# Patient Record
Sex: Female | Born: 1970 | Race: Black or African American | Hispanic: No | Marital: Married | Smoking: Never smoker
Health system: Southern US, Community
[De-identification: ages and names within clinical notes are randomized; demographics above are authoritative.]

## PROBLEM LIST (undated history)

## (undated) DIAGNOSIS — IMO0002 Reserved for concepts with insufficient information to code with codable children: Secondary | ICD-10-CM

## (undated) DIAGNOSIS — N898 Other specified noninflammatory disorders of vagina: Secondary | ICD-10-CM

## (undated) DIAGNOSIS — F419 Anxiety disorder, unspecified: Secondary | ICD-10-CM

## (undated) DIAGNOSIS — J45909 Unspecified asthma, uncomplicated: Secondary | ICD-10-CM

## (undated) DIAGNOSIS — I10 Essential (primary) hypertension: Secondary | ICD-10-CM

## (undated) DIAGNOSIS — R87619 Unspecified abnormal cytological findings in specimens from cervix uteri: Secondary | ICD-10-CM

## (undated) DIAGNOSIS — D219 Benign neoplasm of connective and other soft tissue, unspecified: Secondary | ICD-10-CM

## (undated) DIAGNOSIS — F32A Depression, unspecified: Secondary | ICD-10-CM

## (undated) DIAGNOSIS — R002 Palpitations: Secondary | ICD-10-CM

## (undated) DIAGNOSIS — F329 Major depressive disorder, single episode, unspecified: Secondary | ICD-10-CM

## (undated) DIAGNOSIS — K219 Gastro-esophageal reflux disease without esophagitis: Secondary | ICD-10-CM

## (undated) DIAGNOSIS — A64 Unspecified sexually transmitted disease: Secondary | ICD-10-CM

## (undated) DIAGNOSIS — E559 Vitamin D deficiency, unspecified: Secondary | ICD-10-CM

## (undated) HISTORY — PX: DILATION AND CURETTAGE OF UTERUS: SHX78

## (undated) HISTORY — DX: Major depressive disorder, single episode, unspecified: F32.9

## (undated) HISTORY — DX: Reserved for concepts with insufficient information to code with codable children: IMO0002

## (undated) HISTORY — DX: Other specified noninflammatory disorders of vagina: N89.8

## (undated) HISTORY — DX: Depression, unspecified: F32.A

## (undated) HISTORY — DX: Unspecified asthma, uncomplicated: J45.909

## (undated) HISTORY — DX: Anxiety disorder, unspecified: F41.9

## (undated) HISTORY — DX: Essential (primary) hypertension: I10

## (undated) HISTORY — PX: CRYOTHERAPY: SHX1416

## (undated) HISTORY — DX: Palpitations: R00.2

## (undated) HISTORY — DX: Unspecified sexually transmitted disease: A64

## (undated) HISTORY — DX: Gastro-esophageal reflux disease without esophagitis: K21.9

## (undated) HISTORY — DX: Vitamin D deficiency, unspecified: E55.9

## (undated) HISTORY — DX: Benign neoplasm of connective and other soft tissue, unspecified: D21.9

## (undated) HISTORY — DX: Unspecified abnormal cytological findings in specimens from cervix uteri: R87.619

## (undated) HISTORY — PX: COLPOSCOPY: SHX161

---

## 2013-03-18 ENCOUNTER — Other Ambulatory Visit: Payer: Self-pay | Admitting: Certified Nurse Midwife

## 2013-03-20 NOTE — Telephone Encounter (Signed)
Patient was given 1 year supply at [facility] in 11-13 is scheduled for 11-14

## 2013-04-28 ENCOUNTER — Other Ambulatory Visit: Payer: Self-pay | Admitting: Certified Nurse Midwife

## 2013-04-28 NOTE — Telephone Encounter (Signed)
[facility] request for refill on NUVARING Last filled - 09/22/12 X 1 YEAR Last AEX - 10/31/3 Next AEX - 09/26/13 RX denied.  Requested too soon.

## 2013-09-26 ENCOUNTER — Encounter: Payer: Self-pay | Admitting: Certified Nurse Midwife

## 2013-09-26 ENCOUNTER — Ambulatory Visit (INDEPENDENT_AMBULATORY_CARE_PROVIDER_SITE_OTHER): Payer: 59 | Admitting: Certified Nurse Midwife

## 2013-09-26 VITALS — BP 110/64 | Ht 61.75 in | Wt 149.0 lb

## 2013-09-26 DIAGNOSIS — Z01419 Encounter for gynecological examination (general) (routine) without abnormal findings: Secondary | ICD-10-CM

## 2013-09-26 DIAGNOSIS — Z0000 Encounter for general adult medical examination without abnormal findings: Secondary | ICD-10-CM

## 2013-09-26 DIAGNOSIS — Z309 Encounter for contraceptive management, unspecified: Secondary | ICD-10-CM

## 2013-09-26 DIAGNOSIS — Z23 Encounter for immunization: Secondary | ICD-10-CM

## 2013-09-26 DIAGNOSIS — A6000 Herpesviral infection of urogenital system, unspecified: Secondary | ICD-10-CM

## 2013-09-26 MED ORDER — ETONOGESTREL-ETHINYL ESTRADIOL 0.12-0.015 MG/24HR VA RING: VAGINAL_RING | VAGINAL | Status: DC

## 2013-09-26 MED ORDER — VALACYCLOVIR HCL 1 G PO TABS: 1000.0000 mg | ORAL_TABLET | Freq: Every day | ORAL | Status: DC

## 2013-09-26 MED ORDER — ETONOGESTREL-ETHINYL ESTRADIOL 0.12-0.015 MG/24HR VA RING
VAGINAL_RING | VAGINAL | Status: DC
Start: 2013-09-26 — End: 2014-10-26

## 2013-09-26 NOTE — Progress Notes (Signed)
42 y.o. [id number] Single African American Fe here for annual exam.  Periods normal, no issues. Nuvaring working well for contraception, desires continuance. Desires GC, Chlamydia screen only today. Sees PCP prn. Had lab work at health fair at work, all normal per patient. No health issues today.  Patient's last menstrual period was 09/17/2013.          Sexually active: yes  The current method of family planning is NuvaRing vaginal inserts.    Exercising: yes  cardio Smoker:  no  Health Maintenance: Pap:  09-22-12 neg HPV neg MMG:  none Colonoscopy:  none BMD:   none TDaP:  2002 Labs: Poct urine-neg Self breast exam: done occ   reports that she has never smoked. She does not have any smokeless tobacco history on file. She reports that she does not drink alcohol or use illicit drugs.  Past Medical History  Diagnosis Date  . Asthma   . STD (sexually transmitted disease)     HSV2  . Depression   . Abnormal Pap smear     many yrs ago    Past Surgical History  Procedure Laterality Date  . Colposcopy    . Cryotherapy      for abnormal pap  . Dilation and curettage of uterus      Current Outpatient Prescriptions  Medication Sig Dispense Refill  . ibuprofen (ADVIL,MOTRIN) 200 MG tablet Take 200 mg by mouth every 6 (six) hours as needed.      . Multiple Vitamins-Minerals (MULTIVITAMIN PO) Take by mouth daily.      Marland Kitchen NUVARING 0.12-0.015 MG/24HR vaginal ring Insert vaginally for 21 days then remove for 7 days      . phentermine 37.5 MG capsule Take 37.5 mg by mouth every morning.      . valACYclovir (VALTREX) 1000 MG tablet daily. 1/2 daily       No current facility-administered medications for this visit.    Family History  Problem Relation Age of Onset  . Diabetes Mother   . Cancer Mother     leukemia  . Lupus Mother   . Hypertension Mother   . Hypertension Father   . Breast cancer Maternal Aunt     ROS:  Pertinent items are noted in HPI.  Otherwise, a comprehensive ROS  was negative.  Exam:   BP 110/64  Ht 5' 1.75" (1.568 m)  Wt 149 lb (67.586 kg)  BMI 27.49 kg/m2  LMP 09/17/2013 Height: 5' 1.75" (156.8 cm)  Ht Readings from Last 3 Encounters:  09/26/13 5' 1.75" (1.568 m)    General appearance: alert, cooperative and appears stated age Head: Normocephalic, without obvious abnormality, atraumatic Neck: no adenopathy, supple, symmetrical, trachea midline and thyroid normal to inspection and palpation Lungs: clear to auscultation bilaterally Breasts: normal appearance, no masses or tenderness, No nipple retraction or dimpling, No nipple discharge or bleeding, No axillary or supraclavicular adenopathy Heart: regular rate and rhythm Abdomen: soft, non-tender; no masses,  no organomegaly Extremities: extremities normal, atraumatic, no cyanosis or edema Skin: Skin color, texture, turgor normal. No rashes or lesions Lymph nodes: Cervical, supraclavicular, and axillary nodes normal. No abnormal inguinal nodes palpated Neurologic: Grossly normal   Pelvic: External genitalia:  no lesions              Urethra:  normal appearing urethra with no masses, tenderness or lesions              Bartholin's and Skene's: normal  Vagina: normal appearing vagina with normal color and discharge, no lesions              Cervix: normal, non tender              Pap taken: no Bimanual Exam:  Uterus:  normal size, contour, position, consistency, mobility, non-tender and anteverted              Adnexa: normal adnexa and no mass, fullness, tenderness               Rectovaginal: Confirms               Anus:  normal sphincter tone, no lesions  A:  Well Woman with normal exam  Contraception Nuvaring desired  STD screening  Immunization update  P:   Reviewed health and wellness pertinent to exam  Rx Nuvaring see order  Lab GC,Chlamydia  Requests  TDAP  Pap smear as per guidelines   Mammogram given information to schedule her first one pap smear not taken  today  counseled on breast self exam, mammography screening, STD prevention, adequate intake of calcium and vitamin D, diet and exercise  return annually or prn  An After Visit Summary was printed and given to the patient.

## 2013-09-27 NOTE — Progress Notes (Signed)
Note reviewed, agree with plan.  Coraline Talwar, MD  

## 2013-09-28 LAB — IPS N GONORRHOEA AND CHLAMYDIA BY PCR

## 2014-01-08 ENCOUNTER — Ambulatory Visit (INDEPENDENT_AMBULATORY_CARE_PROVIDER_SITE_OTHER): Payer: 59 | Admitting: Internal Medicine

## 2014-01-08 VITALS — BP 110/72 | HR 87 | Temp 98.9°F | Resp 18 | Ht 62.0 in | Wt 156.0 lb

## 2014-01-08 DIAGNOSIS — K529 Noninfective gastroenteritis and colitis, unspecified: Secondary | ICD-10-CM

## 2014-01-08 DIAGNOSIS — K5289 Other specified noninfective gastroenteritis and colitis: Secondary | ICD-10-CM

## 2014-01-08 MED ORDER — ONDANSETRON HCL 8 MG PO TABS: 8.0000 mg | ORAL_TABLET | Freq: Three times a day (TID) | ORAL | Status: DC | PRN

## 2014-01-08 NOTE — Progress Notes (Signed)
   Subjective:    Patient ID: Beth Frost, female    DOB: 08-Jan-1971, 43 y.o.   MRN: [id number]  HPI 43 year old female presents with nausea, emesis, chills, and diarrhea. She started feeling sick on Saturday. She also complains of stomach cramps. The diarrhea is her biggest concern. She had shrimp on Friday night so she's not sure if she has a stomach virus or food poisoning. She has taken pepto bismol and says that has not helped. She is not able to keep fluids in her. Since Saturday the only solid food she has eaten is half a PB sandwich and a little bite of green beans; otherwise it has all been liquid intake. She also complains of a slight headache; no sensitivity to light. 9 sttols per day, no vomiting, no blood or mucus   Review of Systems     Objective:   Physical Exam  Vitals reviewed. Constitutional: She is oriented to person, place, and time. She appears well-developed and well-nourished.  Eyes: EOM are normal. No scleral icterus.  Neck: Normal range of motion.  Cardiovascular: Normal rate, regular rhythm and normal heart sounds.   Pulmonary/Chest: Effort normal and breath sounds normal.  Abdominal: Soft. Bowel sounds are increased. There is no hepatosplenomegaly. There is no tenderness. There is no rebound and no CVA tenderness.  Neurological: She is alert and oriented to person, place, and time. She exhibits normal muscle tone. Coordination normal.  Psychiatric: She has a normal mood and affect.   Lying 135/86  p 79 Standing 130/87  p 89       Assessment & Plan:  Viral gastroenteritis OOW 3d

## 2014-01-08 NOTE — Patient Instructions (Signed)
Viral Gastroenteritis Viral gastroenteritis is also known as stomach flu. This condition affects the stomach and intestinal tract. It can cause sudden diarrhea and vomiting. The illness typically lasts 3 to 8 days. Most people develop an immune response that eventually gets rid of the virus. While this natural response develops, the virus can make you quite ill. CAUSES  Many different viruses can cause gastroenteritis, such as rotavirus or noroviruses. You can catch one of these viruses by consuming contaminated food or water. You may also catch a virus by sharing utensils or other personal items with an infected person or by touching a contaminated surface. SYMPTOMS  The most common symptoms are diarrhea and vomiting. These problems can cause a severe loss of body fluids (dehydration) and a body salt (electrolyte) imbalance. Other symptoms may include:  Fever.  Headache.  Fatigue.  Abdominal pain. DIAGNOSIS  Your caregiver can usually diagnose viral gastroenteritis based on your symptoms and a physical exam. A stool sample may also be taken to test for the presence of viruses or other infections. TREATMENT  This illness typically goes away on its own. Treatments are aimed at rehydration. The most serious cases of viral gastroenteritis involve vomiting so severely that you are not able to keep fluids down. In these cases, fluids must be given through an intravenous line (IV). HOME CARE INSTRUCTIONS   Drink enough fluids to keep your urine clear or pale yellow. Drink small amounts of fluids frequently and increase the amounts as tolerated.  Ask your caregiver for specific rehydration instructions.  Avoid:  Foods high in sugar.  Alcohol.  Carbonated drinks.  Tobacco.  Juice.  Caffeine drinks.  Extremely hot or cold fluids.  Fatty, greasy foods.  Too much intake of anything at one time.  Dairy products until 24 to 48 hours after diarrhea stops.  You may consume probiotics.  Probiotics are active cultures of beneficial bacteria. They may lessen the amount and number of diarrheal stools in adults. Probiotics can be found in yogurt with active cultures and in supplements.  Wash your hands well to avoid spreading the virus.  Only take over-the-counter or prescription medicines for pain, discomfort, or fever as directed by your caregiver. Do not give aspirin to children. Antidiarrheal medicines are not recommended.  Ask your caregiver if you should continue to take your regular prescribed and over-the-counter medicines.  Keep all follow-up appointments as directed by your caregiver. SEEK IMMEDIATE MEDICAL CARE IF:   You are unable to keep fluids down.  You do not urinate at least once every 6 to 8 hours.  You develop shortness of breath.  You notice blood in your stool or vomit. This may look like coffee grounds.  You have abdominal pain that increases or is concentrated in one small area (localized).  You have persistent vomiting or diarrhea.  You have a fever.  The patient is a child younger than 3 months, and he or she has a fever.  The patient is a child older than 3 months, and he or she has a fever and persistent symptoms.  The patient is a child older than 3 months, and he or she has a fever and symptoms suddenly get worse.  The patient is a baby, and he or she has no tears when crying. MAKE SURE YOU:   Understand these instructions.  Will watch your condition.  Will get help right away if you are not doing well or get worse. Document Released: 11/09/2005 Document Revised: 02/01/2012 Document Reviewed: 08/26/2011   or she has no tears when crying.  MAKE SURE YOU:   · Understand these instructions.  · Will watch your condition.  · Will get help right away if you are not doing well or get worse.  Document Released: 11/09/2005 Document Revised: 02/01/2012 Document Reviewed: 08/26/2011  ExitCare® Patient Information ©2014 [employer].  Diet for Diarrhea, Adult  Frequent, runny stools (diarrhea) may be caused or worsened by food or drink. Diarrhea may be relieved by changing your diet. Since diarrhea can last up to 7 days, it is easy for you to lose too much fluid from the body and become dehydrated. Fluids that are  lost need to be replaced. Along with a modified diet, make sure you drink enough fluids to keep your urine clear or pale yellow.  DIET INSTRUCTIONS  · Ensure adequate fluid intake (hydration): have 1 cup (8 oz) of fluid for each diarrhea episode. Avoid fluids that contain simple sugars or sports drinks, fruit juices, whole milk products, and sodas. Your urine should be clear or pale yellow if you are drinking enough fluids. Hydrate with an oral rehydration solution that you can purchase at pharmacies, retail stores, and online. You can prepare an oral rehydration solution at home by mixing the following ingredients together:  ·   tsp table salt.  · ¾ tsp baking soda.  ·  tsp salt substitute containing potassium chloride.  · 1  tablespoons sugar.  · 1 L (34 oz) of water.  · Certain foods and beverages may increase the speed at which food moves through the gastrointestinal (GI) tract. These foods and beverages should be avoided and include:  · Caffeinated and alcoholic beverages.  · High-fiber foods, such as raw fruits and vegetables, nuts, seeds, and whole grain breads and cereals.  · Foods and beverages sweetened with sugar alcohols, such as xylitol, sorbitol, and mannitol.  · Some foods may be well tolerated and may help thicken stool including:  · Starchy foods, such as rice, toast, pasta, low-sugar cereal, oatmeal, grits, baked potatoes, crackers, and bagels.    · Bananas.    · Applesauce.  · Add probiotic-rich foods to help increase healthy bacteria in the GI tract, such as yogurt and fermented milk products.  RECOMMENDED FOODS AND BEVERAGES  Starches  Choose foods with less than 2 g of fiber per serving.  · Recommended:  White, French, and pita breads, plain rolls, buns, bagels. Plain muffins, matzo. Soda, saltine, or graham crackers. Pretzels, melba toast, zwieback. Cooked cereals made with water: cornmeal, farina, cream cereals. Dry cereals: refined corn, wheat, rice. Potatoes prepared any way without skins,  refined macaroni, spaghetti, noodles, refined rice.  · Avoid:  Bread, rolls, or crackers made with whole wheat, multi-grains, rye, bran seeds, nuts, or coconut. Corn tortillas or taco shells. Cereals containing whole grains, multi-grains, bran, coconut, nuts, raisins. Cooked or dry oatmeal. Coarse wheat cereals, granola. Cereals advertised as "high-fiber." Potato skins. Whole grain pasta, wild or brown rice. Popcorn. Sweet potatoes, yams. Sweet rolls, doughnuts, waffles, pancakes, sweet breads.  Vegetables  · Recommended: Strained tomato and vegetable juices. Most well-cooked and canned vegetables without seeds. Fresh: Tender lettuce, cucumber without the skin, cabbage, spinach, bean sprouts.  · Avoid: Fresh, cooked, or canned: Artichokes, baked beans, beet greens, broccoli, Brussels sprouts, corn, kale, legumes, peas, sweet potatoes. Cooked: Green or red cabbage, spinach. Avoid large servings of any vegetables because vegetables shrink when cooked, and they contain more fiber per serving than fresh vegetables.  Fruit  · Recommended: Cooked   or canned: Apricots, applesauce, cantaloupe, cherries, fruit cocktail, grapefruit, grapes, kiwi, mandarin oranges, peaches, pears, plums, watermelon. Fresh: Apples without skin, ripe banana, grapes, cantaloupe, cherries, grapefruit, peaches, oranges, plums. Keep servings limited to ½ cup or 1 piece.  · Avoid: Fresh: Apples with skin, apricots, mangoes, pears, raspberries, strawberries. Prune juice, stewed or dried prunes. Dried fruits, raisins, dates. Large servings of all fresh fruits.  Protein  · Recommended: Ground or well-cooked tender beef, ham, veal, lamb, pork, or poultry. Eggs. Fish, oysters, shrimp, lobster, other seafoods. Liver, organ meats.  · Avoid: Tough, fibrous meats with gristle. Peanut butter, smooth or chunky. Cheese, nuts, seeds, legumes, dried peas, beans, lentils.  Dairy  · Recommended: Yogurt, lactose-free milk, kefir, drinkable yogurt, buttermilk, soy  milk, or plain hard cheese.  · Avoid: Milk, chocolate milk, beverages made with milk, such as milkshakes.  Soups  · Recommended: Bouillon, broth, or soups made from allowed foods. Any strained soup.  · Avoid: Soups made from vegetables that are not allowed, cream or milk-based soups.  Desserts and Sweets  · Recommended: Sugar-free gelatin, sugar-free frozen ice pops made without sugar alcohol.  · Avoid: Plain cakes and cookies, pie made with fruit, pudding, custard, cream pie. Gelatin, fruit, ice, sherbet, frozen ice pops. Ice cream, ice milk without nuts. Plain hard candy, honey, jelly, molasses, syrup, sugar, chocolate syrup, gumdrops, marshmallows.  Fats and Oils  · Recommended: Limit fats to less than 8 tsp per day.  · Avoid: Seeds, nuts, olives, avocados. Margarine, butter, cream, mayonnaise, salad oils, plain salad dressings. Plain gravy, crisp bacon without rind.  Beverages  · Recommended: Water, decaffeinated teas, oral rehydration solutions, sugar-free beverages not sweetened with sugar alcohols.  · Avoid: Fruit juices, caffeinated beverages (coffee, tea, soda), alcohol, sports drinks, or lemon-lime soda.  Condiments  · Recommended: Ketchup, mustard, horseradish, vinegar, cocoa powder. Spices in moderation: allspice, basil, bay leaves, celery powder or leaves, cinnamon, cumin powder, curry powder, ginger, mace, marjoram, onion or garlic powder, oregano, paprika, parsley flakes, ground pepper, rosemary, sage, savory, tarragon, thyme, turmeric.  · Avoid: Coconut, honey.  Document Released: 01/30/2004 Document Revised: 08/03/2012 Document Reviewed: 03/25/2012  ExitCare® Patient Information ©2014 [employer].

## 2014-04-23 ENCOUNTER — Ambulatory Visit (INDEPENDENT_AMBULATORY_CARE_PROVIDER_SITE_OTHER): Payer: 59 | Admitting: Family Medicine

## 2014-04-23 VITALS — BP 136/92 | HR 80 | Temp 98.3°F | Resp 18 | Ht 62.0 in | Wt 145.8 lb

## 2014-04-23 DIAGNOSIS — R109 Unspecified abdominal pain: Secondary | ICD-10-CM

## 2014-04-23 HISTORY — PX: CHOLECYSTECTOMY: SHX55

## 2014-04-23 LAB — POCT UA - MICROSCOPIC ONLY
CASTS, UR, LPF, POC: NEGATIVE
Crystals, Ur, HPF, POC: NEGATIVE
YEAST UA: NEGATIVE

## 2014-04-23 LAB — POCT CBC
Granulocyte percent: 68.8 %G (ref 37–80)
HEMATOCRIT: 41.1 % (ref 37.7–47.9)
Hemoglobin: 13.3 g/dL (ref 12.2–16.2)
Lymph, poc: 1.9 (ref 0.6–3.4)
MCH, POC: 27.8 pg (ref 27–31.2)
MCHC: 32.4 g/dL (ref 31.8–35.4)
MCV: 86 fL (ref 80–97)
MID (cbc): 0.4 (ref 0–0.9)
MPV: 9.1 fL (ref 0–99.8)
PLATELET COUNT, POC: 319 10*3/uL (ref 142–424)
POC Granulocyte: 5 (ref 2–6.9)
POC LYMPH PERCENT: 26 %L (ref 10–50)
POC MID %: 5.2 %M (ref 0–12)
RBC: 4.78 M/uL (ref 4.04–5.48)
RDW, POC: 14 %
WBC: 7.3 10*3/uL (ref 4.6–10.2)

## 2014-04-23 LAB — COMPREHENSIVE METABOLIC PANEL
ALBUMIN: 4 g/dL (ref 3.5–5.2)
ALK PHOS: 42 U/L (ref 39–117)
ALT: 8 U/L (ref 0–35)
AST: 14 U/L (ref 0–37)
BUN: 6 mg/dL (ref 6–23)
CO2: 24 mEq/L (ref 19–32)
Calcium: 9.4 mg/dL (ref 8.4–10.5)
Chloride: 106 mEq/L (ref 96–112)
Creat: 0.53 mg/dL (ref 0.50–1.10)
GLUCOSE: 93 mg/dL (ref 70–99)
POTASSIUM: 4.3 meq/L (ref 3.5–5.3)
Sodium: 138 mEq/L (ref 135–145)
TOTAL PROTEIN: 7.2 g/dL (ref 6.0–8.3)
Total Bilirubin: 0.7 mg/dL (ref 0.2–1.2)

## 2014-04-23 LAB — POCT URINALYSIS DIPSTICK
Bilirubin, UA: NEGATIVE
Blood, UA: NEGATIVE
Glucose, UA: NEGATIVE
LEUKOCYTES UA: NEGATIVE
Nitrite, UA: NEGATIVE
PH UA: 7
Spec Grav, UA: 1.015
UROBILINOGEN UA: 0.2

## 2014-04-23 NOTE — Patient Instructions (Signed)
Take over the counter acid blocker such as Zantac or Pepcid  (generic fine) twice a day Avoid fatty, greasy and heavy foods, or any other food that causes you pain Our office will call you with an appointment for an ultrasound Return to clinic if you get worse, run a fever over 101 for more than 24 hours.

## 2014-04-23 NOTE — Progress Notes (Signed)
   Subjective:    Patient ID: Beth Frost, female    DOB: June 23, 1971, 43 y.o.   MRN: [id number]  HPI Patient presents today with upper epigastric and upper right quadrant pain x 1 month. Pain lasts for a couple of hours and then spontaneously resolves. She reports this is triggered by certain foods, including fried foods, spicy or heavy foods. She had eggs, bacon and grits yesterday which caused her pain. She also had ribs and macaroni and cheese last night that caused her pain. She has been drinking protein drinks for weight loss and thought the pain may be due to reintroduction of regular food. She does not have pain daily. She has lost 11 pounds in the last 3 1/2 months. The pain has interfered with sleep.   Tried tums and drinking milk without relief.   She recall that her mother had her gallbladder removed, but she doesn't know at what age.  She sees Beth Frost for regular gyn care and uses Nuvaring for contraception.   Review of Systems No fever, vomiting yesterday only, bowel movements regular, no blood in stool, no hematuria, urinates frequently but thinks this is due to large water intake, no SOB, no CP, no esophageal burning or pain.     Objective:   Physical Exam  Vitals reviewed. Constitutional: She appears well-developed and well-nourished. No distress.  HENT:  Head: Normocephalic and atraumatic.  Right Ear: External ear normal.  Left Ear: External ear normal.  Eyes: Conjunctivae are normal. Right eye exhibits no discharge. Left eye exhibits no discharge.  Neck: Normal range of motion. Neck supple.  Cardiovascular: Normal rate, regular rhythm and normal heart sounds.   Pulmonary/Chest: Effort normal and breath sounds normal.  Abdominal: Soft. Bowel sounds are normal. She exhibits no distension and no mass. There is no hepatosplenomegaly. There is tenderness in the right upper quadrant and epigastric area. There is positive Murphy's sign. There is no rigidity, no  rebound, no guarding and no tenderness at McBurney's point.  Skin: She is not diaphoretic.      Assessment & Plan:  1. Abdominal pain, unspecified site - POCT CBC - Comprehensive metabolic panel - POCT UA - Microscopic Only - POCT urinalysis dipstick - US Abdomen Limited; Future -Discussed possible diagnosis of gall stones with patient. Provided written and verbal instructions: Patient Instructions  Take over the counter acid blocker such as Zantac or Pepcid  (generic fine) twice a day Avoid fatty, greasy and heavy foods, or any other food that causes you pain Our office will call you with an appointment for an ultrasound Return to clinic if you get worse, run a fever over 101 for more than 24 hours.   Elby Beck, FNP-BC  [facility]  04/23/2014 9:19 AM

## 2014-04-27 ENCOUNTER — Telehealth: Payer: Self-pay | Admitting: Physician Assistant

## 2014-04-27 ENCOUNTER — Ambulatory Visit
Admission: RE | Admit: 2014-04-27 | Discharge: 2014-04-27 | Disposition: A | Discharge status: Home / Self Care | Payer: 59 | Source: Ambulatory Visit | Attending: Family Medicine | Admitting: Family Medicine

## 2014-04-27 DIAGNOSIS — K8020 Calculus of gallbladder without cholecystitis without obstruction: Secondary | ICD-10-CM

## 2014-04-27 DIAGNOSIS — R109 Unspecified abdominal pain: Secondary | ICD-10-CM

## 2014-04-27 NOTE — Telephone Encounter (Signed)
Pt is doing well as long as she avoids certain foods.  Pt understands results and will be compliant with the referral.

## 2014-04-27 NOTE — Telephone Encounter (Signed)
Call from [facility] regarding GB US: Cholelithiasis Questionable GB wall thickening Nonsonographic Murphy's sign No biliary obstruction

## 2014-04-27 NOTE — Telephone Encounter (Signed)
Please call this patient. Notify her of the GB US results. I've placed referral to general surgery to discuss with them the treatment options, including GB removal.

## 2014-05-02 ENCOUNTER — Encounter (INDEPENDENT_AMBULATORY_CARE_PROVIDER_SITE_OTHER): Payer: Self-pay | Admitting: General Surgery

## 2014-05-02 ENCOUNTER — Ambulatory Visit (INDEPENDENT_AMBULATORY_CARE_PROVIDER_SITE_OTHER): Payer: 59 | Admitting: General Surgery

## 2014-05-02 ENCOUNTER — Telehealth (INDEPENDENT_AMBULATORY_CARE_PROVIDER_SITE_OTHER): Payer: Self-pay | Admitting: General Surgery

## 2014-05-02 VITALS — BP 132/90 | HR 72 | Resp 12 | Ht 62.0 in | Wt 144.2 lb

## 2014-05-02 DIAGNOSIS — K8020 Calculus of gallbladder without cholecystitis without obstruction: Secondary | ICD-10-CM

## 2014-05-02 NOTE — Progress Notes (Signed)
Patient ID: Beth Frost, female   DOB: [birth year], 43 y.o.   MRN: [id number]  Chief Complaint  Patient presents with  . cholelithiasis    HPI Beth Frost is a 43 y.o. female.  Chief complaint: Symptomatic cholelithiasis HPI Patient has a six-week history of intermittent epigastric pain which extends around to her right upper quadrant and right back. It seems to happen after she eats need. She initially attributed to dietary changes but it persisted. Workup was then done including ultrasound showing gallstones. She has been avoiding fatty foods but occasionally still has some discomfort, as recently as last night. I was asked to see her in consultation by Harrison Mons, PAC. regarding cholecystectomy. Past Medical History  Diagnosis Date  . Asthma   . STD (sexually transmitted disease)     HSV2  . Depression   . Abnormal Pap smear     many yrs ago    Past Surgical History  Procedure Laterality Date  . Colposcopy    . Cryotherapy      for abnormal pap  . Dilation and curettage of uterus      Family History  Problem Relation Age of Onset  . Diabetes Mother   . Cancer Mother     leukemia  . Lupus Mother   . Hypertension Mother   . Hypertension Father   . Breast cancer Maternal Aunt     Social History History  Substance Use Topics  . Smoking status: Never Smoker   . Smokeless tobacco: Not on file  . Alcohol Use: No    No Known Allergies  Current Outpatient Prescriptions  Medication Sig Dispense Refill  . cyanocobalamin 500 MCG tablet Take 500 mcg by mouth daily.      Marland Kitchen etonogestrel-ethinyl estradiol (NUVARING) 0.12-0.015 MG/24HR vaginal ring Insert vaginally for 21 days then remove for 7 days  1 each  12  . ibuprofen (ADVIL,MOTRIN) 200 MG tablet Take 200 mg by mouth every 6 (six) hours as needed.      . Multiple Vitamins-Minerals (MULTIVITAMIN PO) Take by mouth daily.      . valACYclovir (VALTREX) 1000 MG tablet Take 1 tablet (1,000 mg total) by mouth daily.  1/2 daily  30 tablet  12   No current facility-administered medications for this visit.    Review of Systems Review of Systems  Constitutional: Negative for fever, chills and unexpected weight change.  HENT: Negative for congestion, hearing loss, sore throat, trouble swallowing and voice change.   Eyes: Negative for visual disturbance.  Respiratory: Negative for cough and wheezing.   Cardiovascular: Negative for chest pain, palpitations and leg swelling.  Gastrointestinal: Positive for nausea and abdominal pain. Negative for vomiting, diarrhea, constipation, blood in stool, abdominal distention and anal bleeding.  Genitourinary: Negative for hematuria, vaginal bleeding and difficulty urinating.  Musculoskeletal: Negative for arthralgias.  Skin: Negative for rash and wound.  Neurological: Negative for seizures, syncope and headaches.  Hematological: Negative for adenopathy. Does not bruise/bleed easily.  Psychiatric/Behavioral: Negative for confusion.    Blood pressure 132/90, pulse 72, resp. rate 12, height 5\' 2"  (1.575 m), weight 144 lb 3.2 oz (65.409 kg), last menstrual period 03/25/2014.  Physical Exam Physical Exam  Constitutional: She is oriented to person, place, and time. She appears well-developed and well-nourished.  HENT:  Head: Normocephalic and atraumatic.  Mouth/Throat: Oropharynx is clear and moist.  Eyes: EOM are normal. Pupils are equal, round, and reactive to light.  Neck: Normal range of motion. Neck supple. No tracheal deviation present.  Cardiovascular: Normal rate, normal heart sounds and intact distal pulses.   Pulmonary/Chest: Effort normal and breath sounds normal. No stridor. No respiratory distress. She has no wheezes. She has no rales.  Abdominal: Soft. Bowel sounds are normal. She exhibits no distension. There is no tenderness. There is no rebound and no guarding.  Musculoskeletal: Normal range of motion.  Neurological: She is alert and oriented to  person, place, and time.  Skin: Skin is warm and dry.    Data Reviewed Ultrasound report  Assessment    Symptomatic Cholelithiasis     Plan    I have offered laparoscopic cholecystectomy with intraoperative cholangiogram. Procedure, risks, and benefits were discussed in detail with the patient. Additionally, she will need to be off work for 4 weeks. 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 Postoperative course was discussed. She is agreeable.       Reign Dziuba E 05/02/2014, 11:52 AM

## 2014-05-02 NOTE — Telephone Encounter (Signed)
Patient met with surgery scheduling went over financial responsibilities, patient will call back to schedule

## 2014-05-10 ENCOUNTER — Other Ambulatory Visit (INDEPENDENT_AMBULATORY_CARE_PROVIDER_SITE_OTHER): Payer: Self-pay | Admitting: General Surgery

## 2014-05-11 ENCOUNTER — Observation Stay (HOSPITAL_COMMUNITY)
Admission: EM | Admit: 2014-05-11 | Discharge: 2014-05-12 | Disposition: A | Discharge status: Home / Self Care | Payer: 59 | Attending: General Surgery | Admitting: General Surgery

## 2014-05-11 ENCOUNTER — Encounter (HOSPITAL_COMMUNITY): Payer: Self-pay | Admitting: Emergency Medicine

## 2014-05-11 ENCOUNTER — Encounter (HOSPITAL_COMMUNITY): Payer: 59 | Admitting: Certified Registered Nurse Anesthetist

## 2014-05-11 ENCOUNTER — Observation Stay (HOSPITAL_COMMUNITY): Payer: 59 | Admitting: Certified Registered Nurse Anesthetist

## 2014-05-11 ENCOUNTER — Encounter (HOSPITAL_COMMUNITY): Admission: EM | Disposition: A | Discharge status: Home / Self Care | Payer: Self-pay | Source: Home / Self Care

## 2014-05-11 DIAGNOSIS — R1011 Right upper quadrant pain: Secondary | ICD-10-CM

## 2014-05-11 DIAGNOSIS — K810 Acute cholecystitis: Secondary | ICD-10-CM | POA: Diagnosis present

## 2014-05-11 DIAGNOSIS — F329 Major depressive disorder, single episode, unspecified: Secondary | ICD-10-CM | POA: Insufficient documentation

## 2014-05-11 DIAGNOSIS — K8020 Calculus of gallbladder without cholecystitis without obstruction: Secondary | ICD-10-CM

## 2014-05-11 DIAGNOSIS — J45909 Unspecified asthma, uncomplicated: Secondary | ICD-10-CM | POA: Insufficient documentation

## 2014-05-11 DIAGNOSIS — K8000 Calculus of gallbladder with acute cholecystitis without obstruction: Principal | ICD-10-CM | POA: Insufficient documentation

## 2014-05-11 DIAGNOSIS — K8010 Calculus of gallbladder with chronic cholecystitis without obstruction: Secondary | ICD-10-CM

## 2014-05-11 DIAGNOSIS — F3289 Other specified depressive episodes: Secondary | ICD-10-CM | POA: Insufficient documentation

## 2014-05-11 HISTORY — PX: CHOLECYSTECTOMY: SHX55

## 2014-05-11 LAB — CBC WITH DIFFERENTIAL/PLATELET
BASOS ABS: 0 10*3/uL (ref 0.0–0.1)
BASOS PCT: 0 % (ref 0–1)
Eosinophils Absolute: 0.1 10*3/uL (ref 0.0–0.7)
Eosinophils Relative: 1 % (ref 0–5)
HCT: 35.5 % — ABNORMAL LOW (ref 36.0–46.0)
Hemoglobin: 12.7 g/dL (ref 12.0–15.0)
Lymphocytes Relative: 28 % (ref 12–46)
Lymphs Abs: 2.3 10*3/uL (ref 0.7–4.0)
MCH: 28.3 pg (ref 26.0–34.0)
MCHC: 35.8 g/dL (ref 30.0–36.0)
MCV: 79.1 fL (ref 78.0–100.0)
Monocytes Absolute: 0.5 10*3/uL (ref 0.1–1.0)
Monocytes Relative: 6 % (ref 3–12)
NEUTROS ABS: 5.4 10*3/uL (ref 1.7–7.7)
Neutrophils Relative %: 65 % (ref 43–77)
Platelets: 285 10*3/uL (ref 150–400)
RBC: 4.49 MIL/uL (ref 3.87–5.11)
RDW: 13.3 % (ref 11.5–15.5)
WBC: 8.2 10*3/uL (ref 4.0–10.5)

## 2014-05-11 LAB — URINALYSIS, ROUTINE W REFLEX MICROSCOPIC
Bilirubin Urine: NEGATIVE
Glucose, UA: NEGATIVE mg/dL
Ketones, ur: 40 mg/dL — AB
LEUKOCYTES UA: NEGATIVE
Nitrite: NEGATIVE
PH: 6.5 (ref 5.0–8.0)
Protein, ur: NEGATIVE mg/dL
SPECIFIC GRAVITY, URINE: 1.017 (ref 1.005–1.030)
Urobilinogen, UA: 1 mg/dL (ref 0.0–1.0)

## 2014-05-11 LAB — COMPREHENSIVE METABOLIC PANEL
ALBUMIN: 3.5 g/dL (ref 3.5–5.2)
ALK PHOS: 48 U/L (ref 39–117)
ALT: 10 U/L (ref 0–35)
AST: 15 U/L (ref 0–37)
BUN: 9 mg/dL (ref 6–23)
CHLORIDE: 99 meq/L (ref 96–112)
CO2: 23 mEq/L (ref 19–32)
CREATININE: 0.53 mg/dL (ref 0.50–1.10)
Calcium: 9.4 mg/dL (ref 8.4–10.5)
GFR calc Af Amer: >90 mL/min (ref >90)
GFR calc non Af Amer: >90 mL/min (ref >90)
Glucose, Bld: 105 mg/dL — ABNORMAL HIGH (ref 70–99)
POTASSIUM: 4 meq/L (ref 3.7–5.3)
Sodium: 137 mEq/L (ref 137–147)
Total Bilirubin: 0.4 mg/dL (ref 0.3–1.2)
Total Protein: 7.6 g/dL (ref 6.0–8.3)

## 2014-05-11 LAB — POC URINE PREG, ED: Preg Test, Ur: NEGATIVE

## 2014-05-11 LAB — URINE MICROSCOPIC-ADD ON

## 2014-05-11 LAB — LIPASE, BLOOD: Lipase: 18 U/L (ref 11–59)

## 2014-05-11 SURGERY — LAPAROSCOPIC CHOLECYSTECTOMY WITH INTRAOPERATIVE CHOLANGIOGRAM
Anesthesia: General | Site: Abdomen

## 2014-05-11 MED ORDER — NEOSTIGMINE METHYLSULFATE 10 MG/10ML IV SOLN
INTRAVENOUS | Status: DC | PRN
  Administered 2014-05-11: 4 mg via INTRAVENOUS

## 2014-05-11 MED ORDER — CIPROFLOXACIN IN D5W 400 MG/200ML IV SOLN
400.0000 mg | Freq: Two times a day (BID) | INTRAVENOUS | Status: DC
  Administered 2014-05-11 – 2014-05-12 (×3): 400 mg via INTRAVENOUS
  Filled 2014-05-11 (×4): qty 200

## 2014-05-11 MED ORDER — HYDROMORPHONE HCL PF 1 MG/ML IJ SOLN
1.0000 mg | Freq: Once | INTRAMUSCULAR | Status: AC
  Administered 2014-05-11: 1 mg via INTRAVENOUS
  Filled 2014-05-11: qty 1

## 2014-05-11 MED ORDER — PROPOFOL 10 MG/ML IV BOLUS
INTRAVENOUS | Status: DC | PRN
  Administered 2014-05-11: 160 mg via INTRAVENOUS

## 2014-05-11 MED ORDER — ONDANSETRON HCL 4 MG/2ML IJ SOLN
INTRAMUSCULAR | Status: AC
  Filled 2014-05-11: qty 2

## 2014-05-11 MED ORDER — ROCURONIUM BROMIDE 50 MG/5ML IV SOLN
INTRAVENOUS | Status: AC
  Filled 2014-05-11: qty 1

## 2014-05-11 MED ORDER — SUCCINYLCHOLINE CHLORIDE 20 MG/ML IJ SOLN
INTRAMUSCULAR | Status: AC
  Filled 2014-05-11: qty 1

## 2014-05-11 MED ORDER — ONDANSETRON HCL 4 MG/2ML IJ SOLN: 4.0000 mg | Freq: Four times a day (QID) | INTRAMUSCULAR | Status: DC | PRN

## 2014-05-11 MED ORDER — FENTANYL CITRATE 0.05 MG/ML IJ SOLN
INTRAMUSCULAR | Status: DC | PRN
  Administered 2014-05-11: 100 ug via INTRAVENOUS
  Administered 2014-05-11: 50 ug via INTRAVENOUS

## 2014-05-11 MED ORDER — OXYCODONE HCL 5 MG PO TABS: 5.0000 mg | ORAL_TABLET | Freq: Once | ORAL | Status: DC | PRN

## 2014-05-11 MED ORDER — SODIUM CHLORIDE 0.9 % IV SOLN
INTRAVENOUS | Status: DC | PRN
  Administered 2014-05-11: 15:00:00

## 2014-05-11 MED ORDER — MIDAZOLAM HCL 2 MG/2ML IJ SOLN
INTRAMUSCULAR | Status: AC
  Filled 2014-05-11: qty 2

## 2014-05-11 MED ORDER — ONDANSETRON HCL 4 MG/2ML IJ SOLN
4.0000 mg | Freq: Once | INTRAMUSCULAR | Status: AC
  Administered 2014-05-11: 4 mg via INTRAVENOUS
  Filled 2014-05-11: qty 2

## 2014-05-11 MED ORDER — HYDROMORPHONE HCL PF 1 MG/ML IJ SOLN
1.0000 mg | INTRAMUSCULAR | Status: DC | PRN
  Administered 2014-05-11: 1 mg via INTRAVENOUS
  Filled 2014-05-11: qty 1

## 2014-05-11 MED ORDER — 0.9 % SODIUM CHLORIDE (POUR BTL) OPTIME
TOPICAL | Status: DC | PRN
  Administered 2014-05-11: 1000 mL

## 2014-05-11 MED ORDER — LIDOCAINE HCL (CARDIAC) 20 MG/ML IV SOLN
INTRAVENOUS | Status: DC | PRN
  Administered 2014-05-11: 70 mg via INTRAVENOUS

## 2014-05-11 MED ORDER — MORPHINE SULFATE 4 MG/ML IJ SOLN
4.0000 mg | Freq: Once | INTRAMUSCULAR | Status: AC
  Administered 2014-05-11: 4 mg via INTRAVENOUS
  Filled 2014-05-11: qty 1

## 2014-05-11 MED ORDER — HYDROMORPHONE HCL PF 1 MG/ML IJ SOLN
0.2500 mg | INTRAMUSCULAR | Status: DC | PRN
  Administered 2014-05-11 (×2): 0.5 mg via INTRAVENOUS

## 2014-05-11 MED ORDER — OXYCODONE HCL 5 MG PO TABS
5.0000 mg | ORAL_TABLET | ORAL | Status: DC | PRN
  Administered 2014-05-11 – 2014-05-12 (×3): 10 mg via ORAL
  Filled 2014-05-11 (×3): qty 2

## 2014-05-11 MED ORDER — PROPOFOL 10 MG/ML IV BOLUS
INTRAVENOUS | Status: AC
  Filled 2014-05-11: qty 20

## 2014-05-11 MED ORDER — LIDOCAINE HCL (CARDIAC) 20 MG/ML IV SOLN
INTRAVENOUS | Status: AC
  Filled 2014-05-11: qty 5

## 2014-05-11 MED ORDER — WHITE PETROLATUM GEL
Status: AC
  Filled 2014-05-11: qty 5

## 2014-05-11 MED ORDER — GLYCOPYRROLATE 0.2 MG/ML IJ SOLN
INTRAMUSCULAR | Status: DC | PRN
  Administered 2014-05-11: .6 mg via INTRAVENOUS

## 2014-05-11 MED ORDER — MIDAZOLAM HCL 5 MG/5ML IJ SOLN
INTRAMUSCULAR | Status: DC | PRN
  Administered 2014-05-11: 2 mg via INTRAVENOUS

## 2014-05-11 MED ORDER — BUPIVACAINE-EPINEPHRINE (PF) 0.25% -1:200000 IJ SOLN
INTRAMUSCULAR | Status: AC
  Filled 2014-05-11: qty 30

## 2014-05-11 MED ORDER — SODIUM CHLORIDE 0.9 % IR SOLN
Status: DC | PRN
  Administered 2014-05-11: 1000 mL

## 2014-05-11 MED ORDER — OXYCODONE HCL 5 MG/5ML PO SOLN: 5.0000 mg | Freq: Once | ORAL | Status: DC | PRN

## 2014-05-11 MED ORDER — BSS IO SOLN
INTRAOCULAR | Status: AC
  Filled 2014-05-11: qty 15

## 2014-05-11 MED ORDER — LACTATED RINGERS IV SOLN
INTRAVENOUS | Status: DC
  Administered 2014-05-11: 13:00:00 via INTRAVENOUS

## 2014-05-11 MED ORDER — ONDANSETRON HCL 4 MG/2ML IJ SOLN
INTRAMUSCULAR | Status: DC | PRN
  Administered 2014-05-11: 4 mg via INTRAVENOUS

## 2014-05-11 MED ORDER — FENTANYL CITRATE 0.05 MG/ML IJ SOLN
INTRAMUSCULAR | Status: AC
  Filled 2014-05-11: qty 5

## 2014-05-11 MED ORDER — ROCURONIUM BROMIDE 100 MG/10ML IV SOLN
INTRAVENOUS | Status: DC | PRN
  Administered 2014-05-11: 50 mg via INTRAVENOUS

## 2014-05-11 MED ORDER — HYDROMORPHONE HCL PF 1 MG/ML IJ SOLN
INTRAMUSCULAR | Status: AC
  Filled 2014-05-11: qty 1

## 2014-05-11 MED ORDER — KCL IN DEXTROSE-NACL 20-5-0.9 MEQ/L-%-% IV SOLN
INTRAVENOUS | Status: DC
  Administered 2014-05-11 (×2): via INTRAVENOUS
  Filled 2014-05-11 (×4): qty 1000

## 2014-05-11 MED ORDER — BUPIVACAINE-EPINEPHRINE 0.25% -1:200000 IJ SOLN
INTRAMUSCULAR | Status: DC | PRN
  Administered 2014-05-11: 30 mL

## 2014-05-11 MED ORDER — EPHEDRINE SULFATE 50 MG/ML IJ SOLN
INTRAMUSCULAR | Status: AC
  Filled 2014-05-11: qty 1

## 2014-05-11 MED ORDER — FENTANYL CITRATE 0.05 MG/ML IJ SOLN
50.0000 ug | Freq: Once | INTRAMUSCULAR | Status: AC
  Administered 2014-05-11: 50 ug via INTRAVENOUS
  Filled 2014-05-11: qty 2

## 2014-05-11 MED ORDER — PHENYLEPHRINE HCL 10 MG/ML IJ SOLN
INTRAMUSCULAR | Status: DC | PRN
  Administered 2014-05-11: 80 ug via INTRAVENOUS

## 2014-05-11 MED ORDER — SODIUM CHLORIDE 0.9 % IJ SOLN
INTRAMUSCULAR | Status: AC
  Filled 2014-05-11: qty 10

## 2014-05-11 MED ORDER — LACTATED RINGERS IV SOLN
INTRAVENOUS | Status: DC | PRN
  Administered 2014-05-11 (×2): via INTRAVENOUS

## 2014-05-11 SURGICAL SUPPLY — 42 items
APPLIER CLIP 5 13 M/L LIGAMAX5 (MISCELLANEOUS) ×3
CANISTER SUCTION 2500CC (MISCELLANEOUS) ×3 IMPLANT
CHLORAPREP W/TINT 26ML (MISCELLANEOUS) ×3 IMPLANT
CLIP APPLIE 5 13 M/L LIGAMAX5 (MISCELLANEOUS) ×1 IMPLANT
CONT SPEC 4OZ CLIKSEAL STRL BL (MISCELLANEOUS) ×6 IMPLANT
COVER MAYO STAND STRL (DRAPES) ×3 IMPLANT
COVER SURGICAL LIGHT HANDLE (MISCELLANEOUS) ×3 IMPLANT
DERMABOND ADVANCED (GAUZE/BANDAGES/DRESSINGS) ×2
DERMABOND ADVANCED .7 DNX12 (GAUZE/BANDAGES/DRESSINGS) ×1 IMPLANT
DRAPE C-ARM 42X72 X-RAY (DRAPES) ×3 IMPLANT
DRAPE UTILITY 15X26 W/TAPE STR (DRAPE) ×6 IMPLANT
ELECT REM PT RETURN 9FT ADLT (ELECTROSURGICAL) ×3
ELECTRODE REM PT RTRN 9FT ADLT (ELECTROSURGICAL) ×1 IMPLANT
FILTER SMOKE EVAC LAPAROSHD (FILTER) ×3 IMPLANT
GLOVE BIO SURGEON STRL SZ7 (GLOVE) ×3 IMPLANT
GLOVE BIO SURGEON STRL SZ8 (GLOVE) ×3 IMPLANT
GLOVE BIOGEL PI IND STRL 8 (GLOVE) ×1 IMPLANT
GLOVE BIOGEL PI INDICATOR 8 (GLOVE) ×2
GLOVE SS BIOGEL STRL SZ 7 (GLOVE) ×1 IMPLANT
GLOVE SUPERSENSE BIOGEL SZ 7 (GLOVE) ×2
GOWN STRL REUS W/ TWL LRG LVL3 (GOWN DISPOSABLE) ×2 IMPLANT
GOWN STRL REUS W/ TWL XL LVL3 (GOWN DISPOSABLE) ×1 IMPLANT
GOWN STRL REUS W/TWL LRG LVL3 (GOWN DISPOSABLE) ×4
GOWN STRL REUS W/TWL XL LVL3 (GOWN DISPOSABLE) ×2
KIT BASIN OR (CUSTOM PROCEDURE TRAY) ×3 IMPLANT
KIT ROOM TURNOVER OR (KITS) ×3 IMPLANT
NEEDLE 22X1 1/2 (OR ONLY) (NEEDLE) ×3 IMPLANT
NS IRRIG 1000ML POUR BTL (IV SOLUTION) ×3 IMPLANT
PAD ARMBOARD 7.5X6 YLW CONV (MISCELLANEOUS) ×3 IMPLANT
POUCH RETRIEVAL ECOSAC 10 (ENDOMECHANICALS) ×1 IMPLANT
POUCH RETRIEVAL ECOSAC 10MM (ENDOMECHANICALS) ×2
SCISSORS LAP 5X35 DISP (ENDOMECHANICALS) ×3 IMPLANT
SET CHOLANGIOGRAPH 5 50 .035 (SET/KITS/TRAYS/PACK) ×3 IMPLANT
SET IRRIG TUBING LAPAROSCOPIC (IRRIGATION / IRRIGATOR) ×3 IMPLANT
SLEEVE ENDOPATH XCEL 5M (ENDOMECHANICALS) ×6 IMPLANT
SPECIMEN JAR SMALL (MISCELLANEOUS) ×3 IMPLANT
SUT VIC AB 4-0 PS2 27 (SUTURE) ×3 IMPLANT
TOWEL OR 17X24 6PK STRL BLUE (TOWEL DISPOSABLE) ×3 IMPLANT
TOWEL OR 17X26 10 PK STRL BLUE (TOWEL DISPOSABLE) ×3 IMPLANT
TRAY LAPAROSCOPIC (CUSTOM PROCEDURE TRAY) ×3 IMPLANT
TROCAR XCEL BLUNT TIP 100MML (ENDOMECHANICALS) ×3 IMPLANT
TROCAR XCEL NON-BLD 5MMX100MML (ENDOMECHANICALS) ×3 IMPLANT

## 2014-05-11 NOTE — Op Note (Signed)
05/11/2014  3:54 PM  PATIENT:  Beth Frost  43 y.o. female  PRE-OPERATIVE DIAGNOSIS:  cholecystitis  POST-OPERATIVE DIAGNOSIS:  cholecystitis  PROCEDURE:  Procedure(s): LAPAROSCOPIC CHOLECYSTECTOMY  SURGEON:  Surgeon(s): Zenovia Jarred, MD  ASSISTANTS: Estill Bakes [place], ARNP   ANESTHESIA:   local and general  EBL:  Total I/O In: 1000 [I.V.:1000] Out: -   BLOOD ADMINISTERED:none  DRAINS: none   SPECIMEN:  Excision  DISPOSITION OF SPECIMEN:  PATHOLOGY  COUNTS:  YES  DICTATION: .Dragon Dictation Patient was scheduled for cholecystectomy in 2 weeks. She presented with another attack last night and was admitted to the hospital. She is brought for cholecystectomy.She is on IV antibiotics. Informed consent was obtained. She's been on the preop holding area. She was brought to the operating room and general endotracheal anesthesia was administered by the anesthesia staff. Her abdomen was prepped and draped in a sterile fashion. We did a time out procedure. Infraumbilical region was infiltrated with local. Infraumbilical incision was made. Subcutaneous tissues were dissected down revealing the anterior fascia. This was divided along the midline and the peritoneal cavity was entered under direct vision. 0 Vicryl pursestring was placed on the fascial opening. Hassan trocar was inserted in the abdomen was insufflated with carbon dioxide in standard fashion. Under direct vision, a 5 mm epigastric and 2 5 mm right lateral ports were placed. Local was used at each port site.The gallbladder was very distended and edematous consistent with acute cholecystitis. The gallbladder was tense. It was drained with the Nahzat drainage system. It contained dark bile. The dome was then retracted superior medially. The infundibulum was retracted inferior laterally. Filmy omental adhesions were swept down revealing the infundibulum. Dissection began laterally and progressed medially identifying the cystic duct  easily. Dissection continued until we had a critical view. There was excellent anatomy and normal liver function tests so we did not do a cholangiogram. 3 clips were placed proximally on the cystic duct, one was placed distally and it was divided. Further dissection revealed the cystic artery. This was clipped twice proximally and once distally and divided. Gallbladder was taken off the liver bed. It had a big mesentery and a lot of edema. The bladder was placed in an Ecosac and removed from the infraumbilical port site. Liver bed was copiously irrigated and hemostasis was ensured. Clips remain in excellent position. Irrigation fluid returned clear. Ports were removed under direct vision. Pneumoperitoneum was released. Infraumbilical fascia was closed by tying the 0 Vicryl suture. All 4 wounds were copiously irrigated and the skin of each was closed with running 4 Vicryl subcuticular followed by Dermabond. All counts were correct. Patient tolerated procedure well without apparent complication and was taken recovery in stable condition.  PATIENT DISPOSITION:  PACU - hemodynamically stable.   Delay start of Pharmacological VTE agent (>24hrs) due to surgical blood loss or risk of bleeding:  no  Georganna Skeans, MD, MPH, FACS Pager: [phone]  6/19/20153:54 PM

## 2014-05-11 NOTE — ED Notes (Signed)
Pt states she has been having sharp upper right quad abd pain since 2200 tonight.  Pt states she is supposed to be having her gall bladder removed in 2 weeks, but is not sure if she can make it until then

## 2014-05-11 NOTE — Transfer of Care (Signed)
Immediate Anesthesia Transfer of Care Note  Patient: Beth Frost  Procedure(s) Performed: Procedure(s): LAPAROSCOPIC CHOLECYSTECTOMY WITH INTRAOPERATIVE CHOLANGIOGRAM (N/A)  Patient Location: PACU  Anesthesia Type:General  Level of Consciousness: awake and patient cooperative  Airway & Oxygen Therapy: Patient Spontanous Breathing and Patient connected to nasal cannula oxygen  Post-op Assessment: Report given to PACU RN, Post -op Vital signs reviewed and stable and Patient moving all extremities X 4  Post vital signs: Reviewed and stable  Complications: No apparent anesthesia complications

## 2014-05-11 NOTE — ED Notes (Signed)
Patient continues to have abd pain.  7/10.  She is aware of surgical consult.  She reports she has been npo since last night

## 2014-05-11 NOTE — Anesthesia Preprocedure Evaluation (Signed)
Anesthesia Evaluation  Patient identified by MRN, date of birth, ID band Patient awake    Reviewed: Allergy & Precautions, H&P , NPO status , Patient's Chart, lab work & pertinent test results  Airway Mallampati: II  Neck ROM: full    Dental   Pulmonary asthma ,          Cardiovascular negative cardio ROS      Neuro/Psych Depression    GI/Hepatic Cholecystitis.   Endo/Other    Renal/GU      Musculoskeletal   Abdominal   Peds  Hematology   Anesthesia Other Findings   Reproductive/Obstetrics                           Anesthesia Physical Anesthesia Plan  ASA: II  Anesthesia Plan: General   Post-op Pain Management:    Induction: Intravenous  Airway Management Planned: Oral ETT  Additional Equipment:   Intra-op Plan:   Post-operative Plan: Extubation in OR  Informed Consent: I have reviewed the patients History and Physical, chart, labs and discussed the procedure including the risks, benefits and alternatives for the proposed anesthesia with the patient or authorized representative who has indicated his/her understanding and acceptance.     Plan Discussed with: CRNA, Anesthesiologist and Surgeon  Anesthesia Plan Comments:         Anesthesia Quick Evaluation

## 2014-05-11 NOTE — ED Provider Notes (Signed)
CSN: [id number]     Arrival date & time 05/11/14  0207 History   First MD Initiated Contact with Patient 05/11/14 [phone]     Chief Complaint  Patient presents with  . Abdominal Pain     Patient is a 43 y.o. female presenting with abdominal pain. The history is provided by the patient.  Abdominal Pain Pain location:  RUQ Pain quality: sharp   Pain radiation: back. Pain severity:  Moderate Onset quality:  Sudden Duration:  6 hours Timing:  Intermittent Progression:  Improving Relieved by: analgesics. Worsened by:  Palpation Associated symptoms: nausea and vomiting   Associated symptoms: no dysuria and no fever     Past Medical History  Diagnosis Date  . Asthma   . STD (sexually transmitted disease)     HSV2  . Depression   . Abnormal Pap smear     many yrs ago   Past Surgical History  Procedure Laterality Date  . Colposcopy    . Cryotherapy      for abnormal pap  . Dilation and curettage of uterus     Family History  Problem Relation Age of Onset  . Diabetes Mother   . Cancer Mother     leukemia  . Lupus Mother   . Hypertension Mother   . Hypertension Father   . Breast cancer Maternal Aunt    History  Substance Use Topics  . Smoking status: Never Smoker   . Smokeless tobacco: Not on file  . Alcohol Use: No   OB History   Grav Para Term Preterm Abortions TAB SAB Ect Mult Living   4 2 2  2 1 1   2      Review of Systems  Constitutional: Negative for fever.  Gastrointestinal: Positive for nausea, vomiting and abdominal pain.  Genitourinary: Negative for dysuria.  All other systems reviewed and are negative.     Allergies  Review of patient's allergies indicates no known allergies.  Home Medications   Prior to Admission medications   Medication Sig Start Date End Date Taking? Authorizing Provider  Acetaminophen-Codeine 300-30 MG per tablet Take 1 tablet by mouth every 4 (four) hours as needed for pain.   Yes Historical Provider, MD  cyanocobalamin  500 MCG tablet Take 500 mcg by mouth daily.   Yes Historical Provider, MD  etonogestrel-ethinyl estradiol (NUVARING) 0.12-0.015 MG/24HR vaginal ring Insert vaginally for 21 days then remove for 7 days 09/26/13  Yes Regina Eck, CNM  Multiple Vitamins-Minerals (MULTIVITAMIN PO) Take by mouth daily.   Yes Historical Provider, MD   BP 158/96  Pulse 78  Temp(Src) 97.7 F (36.5 C) (Oral)  Resp 16  SpO2 100%  LMP 03/25/2014 Physical Exam CONSTITUTIONAL: Well developed/well nourished HEAD: Normocephalic/atraumatic EYES: EOMI/PERRL, no icterus ENMT: Mucous membranes moist NECK: supple no meningeal signs SPINE:entire spine nontender CV: S1/S2 noted, no murmurs/rubs/gallops noted LUNGS: Lungs are clear to auscultation bilaterally, no apparent distress ABDOMEN: soft, moderate RUQ tenderness noted, no rebound or guarding GU:no cva tenderness NEURO: Pt is awake/alert, moves all extremitiesx4 EXTREMITIES: pulses normal, full ROM SKIN: warm, color normal PSYCH: no abnormalities of mood noted  ED Course  Procedures  4:58 AM Pt with continued biliary colic.  It is not responding to IV narcotics Will consult gen. surgery 5:24 AM D/w dr Brantley Stage, will evaluate patient  Labs Review Labs Reviewed  CBC WITH DIFFERENTIAL - Abnormal; Notable for the following:    HCT 35.5 (*)    All other components within normal limits  COMPREHENSIVE METABOLIC PANEL - Abnormal; Notable for the following:    Glucose, Bld 105 (*)    All other components within normal limits  URINALYSIS, ROUTINE W REFLEX MICROSCOPIC - Abnormal; Notable for the following:    Hgb urine dipstick TRACE (*)    Ketones, ur 40 (*)    All other components within normal limits  LIPASE, BLOOD  URINE MICROSCOPIC-ADD ON  POC URINE PREG, ED    MDM   Final diagnoses:  Calculus of gallbladder without cholecystitis without obstruction    Nursing notes including past medical history and social history reviewed and considered in  documentation Labs/vital reviewed and considered Previous records reviewed and considered     Sharyon Cable, MD 05/11/14 [phone]

## 2014-05-11 NOTE — Anesthesia Procedure Notes (Signed)
Procedure Name: Intubation Date/Time: 05/11/2014 3:01 PM Performed by: Carney Living Pre-anesthesia Checklist: Patient identified, Emergency Drugs available, Suction available, Patient being monitored and Timeout performed Patient Re-evaluated:Patient Re-evaluated prior to inductionOxygen Delivery Method: Circle system utilized Preoxygenation: Pre-oxygenation with 100% oxygen Intubation Type: IV induction Ventilation: Mask ventilation without difficulty and Oral airway inserted - appropriate to patient size Laryngoscope Size: Mac and 4 Grade View: Grade II Tube type: Oral Tube size: 7.0 mm Number of attempts: 1 Airway Equipment and Method: Stylet Placement Confirmation: ETT inserted through vocal cords under direct vision,  positive ETCO2 and breath sounds checked- equal and bilateral Secured at: 21 cm Tube secured with: Tape Dental Injury: Teeth and Oropharynx as per pre-operative assessment

## 2014-05-11 NOTE — Progress Notes (Signed)
Patient ID: Beth Frost, female   DOB: May 18, 1971, 43 y.o.   MRN: [id number] Cholecystitis. Just admitted this AM. For lap chole/IOC. Procedure, risks, and benefits again D/W patient. I also discussed the expected post-op course. She agrees. Georganna Skeans, MD, MPH, FACS Trauma: [phone] General Surgery: [phone]

## 2014-05-11 NOTE — ED Notes (Signed)
Patient has one yellow metal ring, one yellow metal ring with clear stones, one white metal ring with clear stones, one yellow metal nose ring with clear stone, one white metal earring with clear stone, one yellow metal necklace with yellow metal charm.  She has purse.  She may send home her money/wallet with family.

## 2014-05-11 NOTE — H&P (Signed)
Beth Frost is an 43 y.o. female.   Chief Complaint: RUQ abdominal pain HPI: ptof Dr Grandville Silos scheduled for lap chole in 2 week for gallstone disease.  Last night had severe attack after dinner.  Fatty foods make symptoms worse.  Pain did not go away.  In ED still having pain even with meds.  Asked to see.  Pain severe constant RUQ and not relieved by meds.   Past Medical History  Diagnosis Date  . Asthma   . STD (sexually transmitted disease)     HSV2  . Depression   . Abnormal Pap smear     many yrs ago    Past Surgical History  Procedure Laterality Date  . Colposcopy    . Cryotherapy      for abnormal pap  . Dilation and curettage of uterus      Family History  Problem Relation Age of Onset  . Diabetes Mother   . Cancer Mother     leukemia  . Lupus Mother   . Hypertension Mother   . Hypertension Father   . Breast cancer Maternal Aunt    Social History:  reports that she has never smoked. She does not have any smokeless tobacco history on file. She reports that she does not drink alcohol or use illicit drugs.  Allergies: No Known Allergies   (Not in a hospital admission)  Results for orders placed during the hospital encounter of 05/11/14 (from the past 48 hour(s))  CBC WITH DIFFERENTIAL     Status: Abnormal   Collection Time    05/11/14  2:19 AM      Result Value Ref Range   WBC 8.2  4.0 - 10.5 K/uL   RBC 4.49  3.87 - 5.11 MIL/uL   Hemoglobin 12.7  12.0 - 15.0 g/dL   HCT 35.5 (*) 36.0 - 46.0 %   MCV 79.1  78.0 - 100.0 fL   MCH 28.3  26.0 - 34.0 pg   MCHC 35.8  30.0 - 36.0 g/dL   RDW 13.3  11.5 - 15.5 %   Platelets 285  150 - 400 K/uL   Neutrophils Relative % 65  43 - 77 %   Neutro Abs 5.4  1.7 - 7.7 K/uL   Lymphocytes Relative 28  12 - 46 %   Lymphs Abs 2.3  0.7 - 4.0 K/uL   Monocytes Relative 6  3 - 12 %   Monocytes Absolute 0.5  0.1 - 1.0 K/uL   Eosinophils Relative 1  0 - 5 %   Eosinophils Absolute 0.1  0.0 - 0.7 K/uL   Basophils Relative 0  0 -  1 %   Basophils Absolute 0.0  0.0 - 0.1 K/uL  COMPREHENSIVE METABOLIC PANEL     Status: Abnormal   Collection Time    05/11/14  2:19 AM      Result Value Ref Range   Sodium 137  137 - 147 mEq/L   Potassium 4.0  3.7 - 5.3 mEq/L   Chloride 99  96 - 112 mEq/L   CO2 23  19 - 32 mEq/L   Glucose, Bld 105 (*) 70 - 99 mg/dL   BUN 9  6 - 23 mg/dL   Creatinine, Ser 0.53  0.50 - 1.10 mg/dL   Calcium 9.4  8.4 - 10.5 mg/dL   Total Protein 7.6  6.0 - 8.3 g/dL   Albumin 3.5  3.5 - 5.2 g/dL   AST 15  0 - 37 U/L  ALT 10  0 - 35 U/L   Alkaline Phosphatase 48  39 - 117 U/L   Total Bilirubin 0.4  0.3 - 1.2 mg/dL   GFR calc non Af Amer >90  >90 mL/min   GFR calc Af Amer >90  >90 mL/min   Comment: (NOTE)     The eGFR has been calculated using the CKD EPI equation.     This calculation has not been validated in all clinical situations.     eGFR's persistently <90 mL/min signify possible Chronic Kidney     Disease.  LIPASE, BLOOD     Status: None   Collection Time    05/11/14  2:19 AM      Result Value Ref Range   Lipase 18  11 - 59 U/L  URINALYSIS, ROUTINE W REFLEX MICROSCOPIC     Status: Abnormal   Collection Time    05/11/14  2:20 AM      Result Value Ref Range   Color, Urine YELLOW  YELLOW   APPearance CLEAR  CLEAR   Specific Gravity, Urine 1.017  1.005 - 1.030   pH 6.5  5.0 - 8.0   Glucose, UA NEGATIVE  NEGATIVE mg/dL   Hgb urine dipstick TRACE (*) NEGATIVE   Bilirubin Urine NEGATIVE  NEGATIVE   Ketones, ur 40 (*) NEGATIVE mg/dL   Protein, ur NEGATIVE  NEGATIVE mg/dL   Urobilinogen, UA 1.0  0.0 - 1.0 mg/dL   Nitrite NEGATIVE  NEGATIVE   Leukocytes, UA NEGATIVE  NEGATIVE  URINE MICROSCOPIC-ADD ON     Status: None   Collection Time    05/11/14  2:20 AM      Result Value Ref Range   Squamous Epithelial / LPF RARE  RARE   RBC / HPF 0-2  <3 RBC/hpf  POC URINE PREG, ED     Status: None   Collection Time    05/11/14  2:33 AM      Result Value Ref Range   Preg Test, Ur NEGATIVE   NEGATIVE   Comment:            THE SENSITIVITY OF THIS     METHODOLOGY IS >24 mIU/mL   No results found.  Review of Systems  Constitutional: Negative for chills and malaise/fatigue.  Eyes: Negative.   Respiratory: Negative.   Cardiovascular: Negative.   Gastrointestinal: Positive for abdominal pain.  Genitourinary: Negative.   Musculoskeletal: Negative.   Skin: Negative.   Neurological: Negative.   Endo/Heme/Allergies: Negative.   Psychiatric/Behavioral: Negative.     Blood pressure 136/83, pulse 72, temperature 98.1 F (36.7 C), temperature source Oral, resp. rate 21, last menstrual period 03/25/2014, SpO2 100.00%. Physical Exam  Constitutional: She is oriented to person, place, and time. She appears well-developed and well-nourished.  HENT:  Head: Normocephalic.  Mouth/Throat: No oropharyngeal exudate.  Eyes: No scleral icterus.  Neck: Neck supple.  Cardiovascular: Normal rate and regular rhythm.   Respiratory: Effort normal and breath sounds normal.  GI: There is tenderness. There is positive Murphy's sign.  Neurological: She is alert and oriented to person, place, and time.  Skin: Skin is warm and dry.  Psychiatric: She has a normal mood and affect. Her behavior is normal. Judgment and thought content normal.     Assessment/Plan Acute cholecystitis Admit Will need lap chole  IV abx  CORNETT,THOMAS A. 05/11/2014, 6:05 AM

## 2014-05-12 MED ORDER — HYDROCODONE-ACETAMINOPHEN 5-325 MG PO TABS: 1.0000 | ORAL_TABLET | Freq: Four times a day (QID) | ORAL | Status: DC | PRN

## 2014-05-12 NOTE — Discharge Instructions (Signed)
See above

## 2014-05-12 NOTE — Discharge Planning (Signed)
Copy of AVS to patient who verbalizes understanding and rx. Dc'd amb to private car with all personal belongings acomp. By family.

## 2014-05-12 NOTE — Discharge Summary (Signed)
  Patient ID: Beth Frost [id number] 43 y.o. November 06, 1971  Admit date: 05/11/2014  Discharge date and time: No discharge date for patient encounter.  Admitting Physician: Doren Custard  Discharge Physician: Adin Hector  Admission Diagnoses: Calculus of gallbladder without cholecystitis without obstruction [574.20]  Discharge Diagnoses: Acute cholecystitis with cholelithiasis  Operations: Procedure(s): LAPAROSCOPIC CHOLECYSTECTOMY   Admission Condition: fair  Discharged Condition: good  Indication for Admission: patient of Dr Grandville Silos scheduled for lap chole in 2 week for gallstone disease. Last night had severe attack after dinner. Fatty foods make symptoms worse. Pain did not go away. In ED still having pain even with meds. Asked to see. Pain severe constant RUQ and not relieved by meds.    Hospital Course: The patient was admitted at 6:05 AM on 05/11/2014. Later that same day she was taken to the operating room by Dr. Grandville Silos and underwent a laparoscopic cholecystectomy. Findings were consistent with acute inflammation and edema of the gallbladder, requiring trocar decompression to facilitate dissection. The following morning the patient felt much better although still had some right upper quadrant pain. She was ambulatory and voiding uneventfully. She was beginning to tolerate a diet. She stated that she wanted to go home later today if once went well. Examination at the time of discharge revealed that she was alert and in no distress. Very cooperative. Abdomen was soft. Appropriately tender. Not distended. Wounds looked good. She was felt to be clinically stable and ready for discharge home. Diet and activities were discussed. Fusion for hydrocodone given to patient. Followup appointment was made for Dr. Grandville Silos.  Consults: None  Significant Diagnostic Studies: None  Treatments: surgery: Laparoscopic cholecystectomy  Disposition: Home  Patient Instructions:     Medication List         Acetaminophen-Codeine 300-30 MG per tablet  Take 1 tablet by mouth every 4 (four) hours as needed for pain.     cyanocobalamin 500 MCG tablet  Take 500 mcg by mouth daily.     etonogestrel-ethinyl estradiol 0.12-0.015 MG/24HR vaginal ring  Commonly known as:  NUVARING  Insert vaginally for 21 days then remove for 7 days     HYDROcodone-acetaminophen 5-325 MG per tablet  Commonly known as:  NORCO  Take 1-2 tablets by mouth every 6 (six) hours as needed.     MULTIVITAMIN PO  Take by mouth daily.        Activity: activity as tolerated Diet: low fat, low cholesterol diet Wound Care: none needed  Follow-up:  With Dr. Grandville Silos in 3 weeks.  Signed: Edsel Petrin. Dalbert Batman, M.D., FACS General and minimally invasive surgery Breast and Colorectal Surgery  05/12/2014, 10:52 AM

## 2014-05-14 NOTE — Anesthesia Postprocedure Evaluation (Signed)
Anesthesia Post Note  Patient: Beth Frost  Procedure(s) Performed: Procedure(s) (LRB): LAPAROSCOPIC CHOLECYSTECTOMY WITH INTRAOPERATIVE CHOLANGIOGRAM (N/A)  Anesthesia type: General  Patient location: PACU  Post pain: Pain level controlled and Adequate analgesia  Post assessment: Post-op Vital signs reviewed, Patient's Cardiovascular Status Stable, Respiratory Function Stable, Patent Airway and Pain level controlled  Last Vitals:  Filed Vitals:   05/12/14 1009  BP: 108/59  Pulse: 83  Temp: 36.6 C  Resp: 16    Post vital signs: Reviewed and stable  Level of consciousness: awake, alert  and oriented  Complications: No apparent anesthesia complications

## 2014-05-15 ENCOUNTER — Encounter (HOSPITAL_COMMUNITY): Payer: Self-pay | Admitting: General Surgery

## 2014-05-16 ENCOUNTER — Other Ambulatory Visit (HOSPITAL_COMMUNITY): Payer: 59

## 2014-05-22 ENCOUNTER — Telehealth (INDEPENDENT_AMBULATORY_CARE_PROVIDER_SITE_OTHER): Payer: Self-pay

## 2014-05-22 NOTE — Telephone Encounter (Signed)
Pt called wanting to know when she could start working out. Pt advised at 2 weeks post op she can return to normal activities per protocol. Pt states she understands.

## 2014-05-24 ENCOUNTER — Ambulatory Visit (HOSPITAL_COMMUNITY): Admission: RE | Admit: 2014-05-24 | Payer: 59 | Source: Ambulatory Visit | Admitting: General Surgery

## 2014-05-24 ENCOUNTER — Encounter (HOSPITAL_COMMUNITY): Admission: RE | Payer: Self-pay | Source: Ambulatory Visit

## 2014-05-24 SURGERY — LAPAROSCOPIC CHOLECYSTECTOMY WITH INTRAOPERATIVE CHOLANGIOGRAM
Anesthesia: General

## 2014-05-30 ENCOUNTER — Ambulatory Visit (INDEPENDENT_AMBULATORY_CARE_PROVIDER_SITE_OTHER): Payer: 59 | Admitting: General Surgery

## 2014-05-30 ENCOUNTER — Encounter (INDEPENDENT_AMBULATORY_CARE_PROVIDER_SITE_OTHER): Payer: Self-pay | Admitting: General Surgery

## 2014-05-30 ENCOUNTER — Encounter (INDEPENDENT_AMBULATORY_CARE_PROVIDER_SITE_OTHER): Payer: Self-pay

## 2014-05-30 VITALS — BP 122/75 | HR 77 | Temp 97.6°F | Resp 14 | Ht 62.0 in | Wt 145.4 lb

## 2014-05-30 DIAGNOSIS — K810 Acute cholecystitis: Secondary | ICD-10-CM

## 2014-05-30 NOTE — Progress Notes (Signed)
Subjective:     Patient ID: Beth Frost, female   DOB: 04-09-1971, 43 y.o.   MRN: [id number]  HPI Patient percent status post laparoscopic cholecystectomy. She is doing well. She has some soreness. She is avoiding heavy activities.  Review of Systems     Objective:   Physical Exam Abdomen soft and nontender. Incisions are well-healed without signs of infection.    Assessment:     Doing well    Plan:     Return to work without restrictions 06/11/14. Return to see [place] when necessary. Pathology was reviewed with her and answered her questions.

## 2014-05-30 NOTE — Patient Instructions (Signed)
Return to work 7/20 with no restrictions. May resume her exercise activities 7/17.

## 2014-06-15 ENCOUNTER — Telehealth (INDEPENDENT_AMBULATORY_CARE_PROVIDER_SITE_OTHER): Payer: Self-pay

## 2014-06-15 NOTE — Telephone Encounter (Signed)
Pt called. Pt describes area at end of incision that sounds like the end of suture. Pt advised if not red,draining or painful she can use neosporin on area and suture should slowly dissolve. Pt advised if knot of suture becomes red,sore or drains she needs to be seen to have area checked and or removed. Pt states she understands and will watch area.

## 2014-07-18 ENCOUNTER — Ambulatory Visit (INDEPENDENT_AMBULATORY_CARE_PROVIDER_SITE_OTHER): Payer: 59 | Admitting: Certified Nurse Midwife

## 2014-07-18 ENCOUNTER — Encounter: Payer: Self-pay | Admitting: Certified Nurse Midwife

## 2014-07-18 VITALS — BP 120/80 | HR 68 | Temp 98.2°F | Resp 16 | Ht 61.75 in | Wt 152.0 lb

## 2014-07-18 DIAGNOSIS — B009 Herpesviral infection, unspecified: Secondary | ICD-10-CM

## 2014-07-18 DIAGNOSIS — A6000 Herpesviral infection of urogenital system, unspecified: Secondary | ICD-10-CM

## 2014-07-18 DIAGNOSIS — N390 Urinary tract infection, site not specified: Secondary | ICD-10-CM

## 2014-07-18 DIAGNOSIS — A6009 Herpesviral infection of other urogenital tract: Secondary | ICD-10-CM

## 2014-07-18 LAB — POCT URINALYSIS DIPSTICK
BILIRUBIN UA: NEGATIVE
Blood, UA: NEGATIVE
Glucose, UA: NEGATIVE
Ketones, UA: NEGATIVE
NITRITE UA: NEGATIVE
Protein, UA: NEGATIVE
Urobilinogen, UA: NEGATIVE
pH, UA: 5

## 2014-07-18 LAB — URINALYSIS, MICROSCOPIC ONLY
BACTERIA UA: NONE SEEN
CRYSTALS: NONE SEEN
Casts: NONE SEEN

## 2014-07-18 MED ORDER — NITROFURANTOIN MONOHYD MACRO 100 MG PO CAPS: 100.0000 mg | ORAL_CAPSULE | Freq: Two times a day (BID) | ORAL | Status: DC

## 2014-07-18 MED ORDER — VALACYCLOVIR HCL 1 G PO TABS: 1000.0000 mg | ORAL_TABLET | Freq: Every day | ORAL | Status: DC

## 2014-07-18 NOTE — Patient Instructions (Signed)
Herpes During and After Pregnancy Genital herpes is a sexually transmitted infection (STI). It is caused by a virus and can be very serious during pregnancy. The greatest concern is passing the virus to the fetus and newborn. The virus is more likely to be passed to the newborn during delivery if the mother becomes infected for the first time late in her pregnancy (primary infection). It is less common for the virus to pass to the newborn if the mother had herpes before becoming pregnant. This is because antibodies against the virus develop over a period of time. These antibodies help protect the baby. Lastly, the infection can pass to the fetus through the placenta. This can happen if the mother gets herpes for the first time in the first 3 months of her pregnancy (first trimester). This can possibly cause a miscarriage or birth defects in the baby. TREATMENT DURING PREGNANCY Medicines may be prescribed that are safe for the mother and the fetus. The medicine can lessen symptoms or prevent a recurrence of the infection. If the infection happened before becoming pregnant, medicine may be prescribed in the last 4 weeks of the pregnancy. This can prevent a recurrent infection at the time of delivery.  RECOMMENDED DELIVERY METHOD If an active, recurrent infection is present at the time delivery, the baby should be delivered by cesarean delivery. This is because the virus can pass to the baby through an infected birth canal. This can cause severe problems for the baby. If the infection happens for the first time late in the pregnancy, the caregiver may also recommend a cesarean delivery. With a new infection, the body has not had the time to build up enough antibodies against the virus to protect the baby from getting the infection. Even if the birth canal does not have visible sores (lesions) from the herpes virus, there is still that chance that the virus can spread to the baby. A cesarean delivery should be  done if there is any signs or feeling of an infection being present in the genital area. Cesarean delivery is not recommended for women with a history of herpes infection but no evidence of active genital lesions at the time of delivery. Lesions that have crusted fully are considered healed and not active. BREASTFEEDING  Women infected with genital herpes can breastfeed their baby. The virus will not be present in the breast milk. If lesions are present on the breast, the baby should not breastfeed from the affected breast(s). HOW TO PREVENT PASSING HERPES TO YOUR BABY AFTER DELIVERY  Wash your hands with soap and water often and before touching your baby.  If you have an outbreak, keep the area clean and covered.  Try to avoid physical and stressful situations that may bring on an outbreak. SEEK IMMEDIATE MEDICAL CARE IF:   You have an outbreak during pregnancy and cannot urinate.  You have an outbreak anytime during your pregnancy and especially in the last 3 months of the pregnancy.  You think you are having an allergic reaction or side effects from the medicine you are taking. Document Released: 02/15/2001 Document Revised: 02/01/2012 Document Reviewed: 10/20/2011 [facility] Patient Information 2015 [place], [place]. This information is not intended to replace advice given to you by your health care provider. Make sure you discuss any questions you have with your health care provider. Urinary Tract Infection A urinary tract infection (UTI) can occur any place along the urinary tract. The tract includes the kidneys, ureters, bladder, and urethra. A type of  germ called bacteria often causes a UTI. UTIs are often helped with antibiotic medicine.  HOME CARE   If given, take antibiotics as told by your doctor. Finish them even if you start to feel better.  Drink enough fluids to keep your pee (urine) clear or pale yellow.  Avoid tea, drinks with caffeine, and bubbly (carbonated)  drinks.  Pee often. Avoid holding your pee in for a long time.  Pee before and after having sex (intercourse).  Wipe from front to back after you poop (bowel movement) if you are a woman. Use each tissue only once. GET HELP RIGHT AWAY IF:   You have back pain.  You have lower belly (abdominal) pain.  You have chills.  You feel sick to your stomach (nauseous).  You throw up (vomit).  Your burning or discomfort with peeing does not go away.  You have a fever.  Your symptoms are not better in 3 days. MAKE SURE YOU:   Understand these instructions.  Will watch your condition.  Will get help right away if you are not doing well or get worse. Document Released: 04/27/2008 Document Revised: 08/03/2012 Document Reviewed: 06/09/2012 [facility] Patient Information 2015 [place], [place]. This information is not intended to replace advice given to you by your health care provider. Make sure you discuss any questions you have with your health care provider.

## 2014-07-18 NOTE — Progress Notes (Signed)
43 y.o. single african [place] female [id number] here with complaint of UTI, with onset  on 7 days. Patient complaining of urinary frequency/urgency/ and pain with urination. Patient denies fever, chills, nausea or back pain. No new personal products. Patient feels not to sexual activity. Denies any vaginal symptoms or new personal products. Contraception is Nuvaring.Contraception Nuvaring. Recent cholecystectomy, for inflamed gallbladder, 6,15. 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 Continues with Valtrex 1000 mg daily. No outbreaks in several years or just one.   O: Healthy female WDWN Affect: Normal, orientation x 3 Skin : warm and dry CVAT: negative bilateral Abdomen: positive for suprapubic tenderness  Pelvic exam: External genital area: normal, no lesions Bladder,Urethra, Urethral meatus: tender Vagina: normal vaginal discharge, normal appearance  Wet prep Cervix: normal, non tender Uterus:normal,non tender Adnexa: normal non tender, no fullness or masses   A: UTI Herpes Out break Cholecystectomy  P: Reviewed findings of UTI and need to increase water intake.Also discussed that UTI can accompany HSV outbreak at times XM:IWOEHOZY see order YQM:GNOIB micro, culture Reviewed warning signs and symptoms of UTI Encouraged to limit soda, tea, and coffee RV 2 week if TOC needed for positive culture Discussed continuing Valtrex as ordered( has RX) and obtaining Aveeno anti itch cream OTC for topical use to help with discomfort. Patient will report if has another outbreak, / absorption change with surgery.  RV prn

## 2014-07-19 LAB — URINE CULTURE: Colony Count: 15000

## 2014-07-20 NOTE — Progress Notes (Signed)
Reviewed personally.  M. Suzanne Remie Mathison, MD.  

## 2014-09-24 ENCOUNTER — Encounter: Payer: Self-pay | Admitting: Certified Nurse Midwife

## 2014-09-27 ENCOUNTER — Telehealth: Payer: Self-pay | Admitting: *Deleted

## 2014-09-27 NOTE — Telephone Encounter (Addendum)
Fax From: [facility] for Valtrex 1 GM  Last Refilled: 07/17/14 #45/5 rfs was sent to [facility] Scheduled: 10/26/14 with Ms. Debbie  Faxed to the pharmacy that this rx is denied, patient should still have refills   Routed to provider for review, encounter closed.

## 2014-09-28 ENCOUNTER — Ambulatory Visit: Payer: 59 | Admitting: Certified Nurse Midwife

## 2014-10-26 ENCOUNTER — Ambulatory Visit (INDEPENDENT_AMBULATORY_CARE_PROVIDER_SITE_OTHER): Payer: 59 | Admitting: Certified Nurse Midwife

## 2014-10-26 ENCOUNTER — Encounter: Payer: Self-pay | Admitting: Certified Nurse Midwife

## 2014-10-26 VITALS — BP 122/82 | HR 72 | Ht 61.25 in | Wt 160.0 lb

## 2014-10-26 DIAGNOSIS — B009 Herpesviral infection, unspecified: Secondary | ICD-10-CM

## 2014-10-26 DIAGNOSIS — Z3049 Encounter for surveillance of other contraceptives: Secondary | ICD-10-CM

## 2014-10-26 DIAGNOSIS — Z0000 Encounter for general adult medical examination without abnormal findings: Secondary | ICD-10-CM

## 2014-10-26 DIAGNOSIS — N390 Urinary tract infection, site not specified: Secondary | ICD-10-CM

## 2014-10-26 DIAGNOSIS — Z124 Encounter for screening for malignant neoplasm of cervix: Secondary | ICD-10-CM

## 2014-10-26 DIAGNOSIS — Z01419 Encounter for gynecological examination (general) (routine) without abnormal findings: Secondary | ICD-10-CM

## 2014-10-26 MED ORDER — ETONOGESTREL-ETHINYL ESTRADIOL 0.12-0.015 MG/24HR VA RING: VAGINAL_RING | VAGINAL | Status: DC

## 2014-10-26 MED ORDER — VALACYCLOVIR HCL 1 G PO TABS: 1000.0000 mg | ORAL_TABLET | Freq: Every day | ORAL | Status: DC

## 2014-10-26 NOTE — Progress Notes (Signed)
43 y.o. [id number] Single African American Fe here for annual exam. Periods normal.  Nuvaring working well. Recent gallbladder removal due to stones, doing well since surgery. Has noted some increase in odor of urine, but has not been drinking fluids as much as before. No urinary frequency or urgency or pain. Sees urgent care if problems. No other health issues today.  Patient's last menstrual period was 10/03/2014.          Sexually active: Yes.    The current method of family planning is NuvaRing vaginal inserts.    Exercising: No.  The patient does not participate in regular exercise at present. Smoker:  no  Health Maintenance: Pap:  09/22/12 HPV neg MMG:  Schedule for 11/21/14 @ 3:30 Colonoscopy:  never BMD:   never TDaP:  09/26/13 Labs: Hgb: 13.0        UA:  WBC-trace, RBC-trace, ph: 7.0   reports that she has never smoked. She does not have any smokeless tobacco history on file. She reports that she drinks about 1.0 oz of alcohol per week. She reports that she does not use illicit drugs.  Past Medical History  Diagnosis Date  . Asthma   . STD (sexually transmitted disease)     HSV2  . Depression   . Abnormal Pap smear     many yrs ago    Past Surgical History  Procedure Laterality Date  . Colposcopy    . Cryotherapy      for abnormal pap  . Dilation and curettage of uterus    . Cholecystectomy N/A 05/11/2014    Procedure: LAPAROSCOPIC CHOLECYSTECTOMY WITH INTRAOPERATIVE CHOLANGIOGRAM;  Surgeon: Zenovia Jarred, MD;  Location: [place];  Service: General;  Laterality: N/A;  . Cholecystectomy  04/2014    Current Outpatient Prescriptions  Medication Sig Dispense Refill  . Acetaminophen (TYLENOL PO) Take by mouth as needed.    . etonogestrel-ethinyl estradiol (NUVARING) 0.12-0.015 MG/24HR vaginal ring Insert vaginally for 21 days then remove for 7 days 1 each 12  . ibuprofen (ADVIL,MOTRIN) 200 MG tablet Take 200 mg by mouth as needed.    . Multiple Vitamins-Minerals  (MULTIVITAMIN PO) Take by mouth daily.    . valACYclovir (VALTREX) 1000 MG tablet Take 1 tablet (1,000 mg total) by mouth daily. 45 tablet 5   No current facility-administered medications for this visit.    Family History  Problem Relation Age of Onset  . Diabetes Mother   . Cancer Mother     leukemia  . Lupus Mother   . Hypertension Mother   . Hypertension Father   . Breast cancer Maternal Aunt     ROS:  Pertinent items are noted in HPI.  Otherwise, a comprehensive ROS was negative.  Exam:   BP 122/82 mmHg  Pulse 72  Ht 5' 1.25" (1.556 m)  Wt 160 lb (72.576 kg)  BMI 29.98 kg/m2  LMP 10/03/2014 Height: 5' 1.25" (155.6 cm)  Ht Readings from Last 3 Encounters:  10/26/14 5' 1.25" (1.556 m)  07/18/14 5' 1.75" (1.568 m)  05/30/14 5\' 2"  (1.575 m)    General appearance: alert, cooperative and appears stated age Head: Normocephalic, without obvious abnormality, atraumatic Neck: no adenopathy, supple, symmetrical, trachea midline and thyroid normal to inspection and palpation Lungs: clear to auscultation bilaterally CVAT negative bilateral Breasts: normal appearance, no masses or tenderness, No nipple retraction or dimpling, No nipple discharge or bleeding, No axillary or supraclavicular adenopathy Heart: regular rate and rhythm Abdomen: soft, non-tender; no masses,  no  organomegaly, negative suprapubic Extremities: extremities normal, atraumatic, no cyanosis or edema Skin: Skin color, texture, turgor normal. No rashes or lesions Lymph nodes: Cervical, supraclavicular, and axillary nodes normal. No abnormal inguinal nodes palpated Neurologic: Grossly normal   Pelvic: External genitalia:  no lesions              Urethra:  normal appearing urethra with no masses, tenderness or lesions, Bladder or urethral meatus non tender              Bartholin's and Skene's: normal                 Vagina: normal appearing vagina with normal color and discharge, no lesions               Cervix: normal, non tender              Pap taken: Yes.   Bimanual Exam:  Uterus:  normal size, contour, position, consistency, mobility, non-tender and anteverted              Adnexa: normal adnexa and no mass, fullness, tenderness               Rectovaginal: Confirms               Anus:  normal sphincter tone, no lesions  A:  Well Woman with normal exam  Contraception Nuvaring desired  R/O UTI  History of HSV on Valtrex  P:   Reviewed health and wellness pertinent to exam  Rx Nuvaring see order  Lab: Urine micro/culture, Increase water and cranberry juice, aware of UTI syymptoms  Rx Valtrex see order  Pap smear taken today with HPV reflex   counseled on breast self exam, mammography screening, adequate intake of calcium and vitamin D, diet and exercise  return annually or prn  An After Visit Summary was printed and given to the patient.

## 2014-10-26 NOTE — Patient Instructions (Addendum)
EXERCISE AND DIET:  We recommended that you start or continue a regular exercise program for good health. Regular exercise means any activity that makes your heart beat faster and makes you sweat.  We recommend exercising at least 30 minutes per day at least 3 days a week, preferably 4 or 5.  We also recommend a diet low in fat and sugar.  Inactivity, poor dietary choices and obesity can cause diabetes, heart attack, stroke, and kidney damage, among others.    ALCOHOL AND SMOKING:  Women should limit their alcohol intake to no more than 7 drinks/beers/glasses of wine (combined, not each!) per week. Moderation of alcohol intake to this level decreases your risk of breast cancer and liver damage. And of course, no recreational drugs are part of a healthy lifestyle.  And absolutely no smoking or even second hand smoke. Most people know smoking can cause heart and lung diseases, but did you know it also contributes to weakening of your bones? Aging of your skin?  Yellowing of your teeth and nails?  CALCIUM AND VITAMIN D:  Adequate intake of calcium and Vitamin D are recommended.  The recommendations for exact amounts of these supplements seem to change often, but generally speaking 600 mg of calcium (either carbonate or citrate) and 800 units of Vitamin D per day seems prudent. Certain women may benefit from higher intake of Vitamin D.  If you are among these women, your doctor will have told you during your visit.    PAP SMEARS:  Pap smears, to check for cervical cancer or precancers,  have traditionally been done yearly, although recent scientific advances have shown that most women can have pap smears less often.  However, every woman still should have a physical exam from her gynecologist every year. It will include a breast check, inspection of the vulva and vagina to check for abnormal growths or skin changes, a visual exam of the cervix, and then an exam to evaluate the size and shape of the uterus and  ovaries.  And after 40 years of age, a rectal exam is indicated to check for rectal cancers. We will also provide age appropriate advice regarding health maintenance, like when you should have certain vaccines, screening for sexually transmitted diseases, bone density testing, colonoscopy, mammograms, etc.   MAMMOGRAMS:  All women over 40 years old should have a yearly mammogram. Many facilities now offer a "3D" mammogram, which may cost around $50 extra out of pocket. If possible,  we recommend you accept the option to have the 3D mammogram performed.  It both reduces the number of women who will be called back for extra views which then turn out to be normal, and it is better than the routine mammogram at detecting truly abnormal areas.    COLONOSCOPY:  Colonoscopy to screen for colon cancer is recommended for all women at age 50.  We know, you hate the idea of the prep.  We agree, BUT, having colon cancer and not knowing it is worse!!  Colon cancer so often starts as a polyp that can be seen and removed at colonscopy, which can quite literally save your life!  And if your first colonoscopy is normal and you have no family history of colon cancer, most women don't have to have it again for 10 years.  Once every ten years, you can do something that may end up saving your life, right?  We will be happy to help you get it scheduled when you are ready.    Be sure to check your insurance coverage so you understand how much it will cost.  It may be covered as a preventative service at no cost, but you should check your particular policy.     Urinary Tract Infection Urinary tract infections (UTIs) can develop anywhere along your urinary tract. Your urinary tract is your body's drainage system for removing wastes and extra water. Your urinary tract includes two kidneys, two ureters, a bladder, and a urethra. Your kidneys are a pair of bean-shaped organs. Each kidney is about the size of your fist. They are located  below your ribs, one on each side of your spine. CAUSES Infections are caused by microbes, which are microscopic organisms, including fungi, viruses, and bacteria. These organisms are so small that they can only be seen through a microscope. Bacteria are the microbes that most commonly cause UTIs. SYMPTOMS  Symptoms of UTIs may vary by age and gender of the patient and by the location of the infection. Symptoms in young women typically include a frequent and intense urge to urinate and a painful, burning feeling in the bladder or urethra during urination. Older women and men are more likely to be tired, shaky, and weak and have muscle aches and abdominal pain. A fever may mean the infection is in your kidneys. Other symptoms of a kidney infection include pain in your back or sides below the ribs, nausea, and vomiting. DIAGNOSIS To diagnose a UTI, your caregiver will ask you about your symptoms. Your caregiver also will ask to provide a urine sample. The urine sample will be tested for bacteria and white blood cells. White blood cells are made by your body to help fight infection. TREATMENT  Typically, UTIs can be treated with medication. Because most UTIs are caused by a bacterial infection, they usually can be treated with the use of antibiotics. The choice of antibiotic and length of treatment depend on your symptoms and the type of bacteria causing your infection. HOME CARE INSTRUCTIONS  If you were prescribed antibiotics, take them exactly as your caregiver instructs you. Finish the medication even if you feel better after you have only taken some of the medication.  Drink enough water and fluids to keep your urine clear or pale yellow.  Avoid caffeine, tea, and carbonated beverages. They tend to irritate your bladder.  Empty your bladder often. Avoid holding urine for long periods of time.  Empty your bladder before and after sexual intercourse.  After a bowel movement, women should cleanse  from front to back. Use each tissue only once. SEEK MEDICAL CARE IF:   You have back pain.  You develop a fever.  Your symptoms do not begin to resolve within 3 days. SEEK IMMEDIATE MEDICAL CARE IF:   You have severe back pain or lower abdominal pain.  You develop chills.  You have nausea or vomiting.  You have continued burning or discomfort with urination. MAKE SURE YOU:   Understand these instructions.  Will watch your condition.  Will get help right away if you are not doing well or get worse. Document Released: 08/19/2005 Document Revised: 05/10/2012 Document Reviewed: 12/18/2011 [employer] Patient Information 2015 [employer]. This information is not intended to replace advice given to you by your health care provider. Make sure you discuss any questions you have with your health care provider.  

## 2014-10-27 LAB — URINALYSIS, MICROSCOPIC ONLY
Bacteria, UA: NONE SEEN
CASTS: NONE SEEN
Crystals: NONE SEEN
Squamous Epithelial / LPF: NONE SEEN

## 2014-10-28 LAB — URINE CULTURE
COLONY COUNT: NO GROWTH
Organism ID, Bacteria: NO GROWTH

## 2014-10-29 LAB — HEMOGLOBIN, FINGERSTICK: Hemoglobin, fingerstick: 13 g/dL (ref 12.0–16.0)

## 2014-10-30 LAB — IPS PAP TEST WITH REFLEX TO HPV

## 2014-10-31 NOTE — Progress Notes (Signed)
Reviewed personally.  M. Suzanne Doniqua Saxby, MD.  

## 2015-08-26 ENCOUNTER — Other Ambulatory Visit: Payer: Self-pay | Admitting: Certified Nurse Midwife

## 2015-08-26 NOTE — Telephone Encounter (Signed)
Medication refill request: Valtrex  Last AEX: 10/26/14 DL Next AEX: None Last MMG (if hormonal medication request): 12/22/14 [id number]:neg Refill authorized: 10/26/14 #[id number] w/ 12 refills to [facility] [address]

## 2015-10-03 ENCOUNTER — Encounter: Payer: Self-pay | Admitting: Obstetrics and Gynecology

## 2015-10-03 ENCOUNTER — Ambulatory Visit (INDEPENDENT_AMBULATORY_CARE_PROVIDER_SITE_OTHER): Payer: 59 | Admitting: Obstetrics and Gynecology

## 2015-10-03 VITALS — BP 130/80 | HR 80 | Resp 14 | Wt 167.0 lb

## 2015-10-03 DIAGNOSIS — T192XXA Foreign body in vulva and vagina, initial encounter: Secondary | ICD-10-CM | POA: Diagnosis not present

## 2015-10-03 NOTE — Progress Notes (Signed)
Patient ID: Beth Frost, female   DOB: 03/30/1971, 44 y.o.   MRN: [id number] GYNECOLOGY  VISIT   HPI: 44 y.o.   Single  African American  female   [phone] with Patient's last menstrual period was 09/30/2015.   here c/o retained tampon.   GYNECOLOGIC HISTORY: Patient's last menstrual period was 09/30/2015. Contraception:Nuvaring  Menopausal hormone therapy: N/A        OB History    Gravida Para Term Preterm AB TAB SAB Ectopic Multiple Living   4 2 2  2 1 1   2          Patient Active Problem List   Diagnosis Date Noted  . Herpes genitalis in women 07/18/2014    Class: History of  . Acute cholecystitis 05/11/2014  . Symptomatic cholelithiasis 05/02/2014    Past Medical History  Diagnosis Date  . Asthma   . STD (sexually transmitted disease)     HSV2  . Depression   . Abnormal Pap smear     many yrs ago    Past Surgical History  Procedure Laterality Date  . Colposcopy    . Cryotherapy      for abnormal pap  . Dilation and curettage of uterus    . Cholecystectomy N/A 05/11/2014    Procedure: LAPAROSCOPIC CHOLECYSTECTOMY WITH INTRAOPERATIVE CHOLANGIOGRAM;  Surgeon: Zenovia Jarred, MD;  Location: [place];  Service: General;  Laterality: N/A;  . Cholecystectomy  04/2014    Current Outpatient Prescriptions  Medication Sig Dispense Refill  . Acetaminophen (TYLENOL PO) Take by mouth as needed.    . etonogestrel-ethinyl estradiol (NUVARING) 0.12-0.015 MG/24HR vaginal ring Insert vaginally for 21 days then remove for 7 days 1 each 12  . ibuprofen (ADVIL,MOTRIN) 200 MG tablet Take 200 mg by mouth as needed.    . Multiple Vitamins-Minerals (MULTIVITAMIN PO) Take by mouth daily.    . valACYclovir (VALTREX) 1000 MG tablet Take 1 tablet (1,000 mg total) by mouth daily. 45 tablet 12   No current facility-administered medications for this visit.     ALLERGIES: Review of patient's allergies indicates no known allergies.  Family History  Problem Relation Age of Onset  .  Diabetes Mother   . Cancer Mother     leukemia  . Lupus Mother   . Hypertension Mother   . Hypertension Father   . Breast cancer Maternal Aunt     Social History   Social History  . Marital Status: Single    Spouse Name: N/A  . Number of Children: N/A  . Years of Education: N/A   Occupational History  . Not on file.   Social History Main Topics  . Smoking status: Never Smoker   . Smokeless tobacco: Never Used  . Alcohol Use: 1.2 oz/week    2 Standard drinks or equivalent per week  . Drug Use: No  . Sexual Activity:    Partners: Male    Birth Control/ Protection: Inserts     Comment: nuvaring   Other Topics Concern  . Not on file   Social History Narrative    Review of Systems  Gastrointestinal: Negative for abdominal pain.  All other systems reviewed and are negative.   PHYSICAL EXAMINATION:    BP 130/80 mmHg  Pulse 80  Resp 14  Wt 167 lb (75.751 kg)  LMP 09/30/2015    General appearance: alert, cooperative and appears stated age  Pelvic: External genitalia:  no lesions  Urethra:  normal appearing urethra with no masses, tenderness or lesions              Bartholins and Skenes: normal                 Vagina: normal appearing vagina with normal color and discharge, no lesions, no tampon seen              Cervix: no lesions              Bimanual Exam:  Uterus:  normal size, contour, position, consistency, mobility, non-tender              Adnexa: no mass, fullness, tenderness               Chaperone was present for exam.  ASSESSMENT Concern for retained tampon, no retained tampon  PLAN Routine f/u   An After Visit Summary was printed and given to the patient.

## 2015-10-29 ENCOUNTER — Encounter: Payer: Self-pay | Admitting: Certified Nurse Midwife

## 2015-10-29 ENCOUNTER — Ambulatory Visit (INDEPENDENT_AMBULATORY_CARE_PROVIDER_SITE_OTHER): Payer: 59 | Admitting: Certified Nurse Midwife

## 2015-10-29 VITALS — BP 120/78 | HR 70 | Resp 16 | Ht 61.75 in | Wt 169.0 lb

## 2015-10-29 DIAGNOSIS — Z0000 Encounter for general adult medical examination without abnormal findings: Secondary | ICD-10-CM

## 2015-10-29 DIAGNOSIS — Z01419 Encounter for gynecological examination (general) (routine) without abnormal findings: Secondary | ICD-10-CM | POA: Diagnosis not present

## 2015-10-29 DIAGNOSIS — Z3049 Encounter for surveillance of other contraceptives: Secondary | ICD-10-CM

## 2015-10-29 LAB — LIPID PANEL
CHOL/HDL RATIO: 2.1 ratio (ref <=5.0)
CHOLESTEROL: 213 mg/dL — AB (ref 125–200)
HDL: 101 mg/dL (ref >=46)
LDL Cholesterol: 93 mg/dL (ref <130)
TRIGLYCERIDES: 94 mg/dL (ref <150)
VLDL: 19 mg/dL (ref <30)

## 2015-10-29 LAB — POCT URINALYSIS DIPSTICK
Bilirubin, UA: NEGATIVE
Blood, UA: NEGATIVE
GLUCOSE UA: NEGATIVE
Ketones, UA: NEGATIVE
Leukocytes, UA: NEGATIVE
NITRITE UA: NEGATIVE
Protein, UA: NEGATIVE
UROBILINOGEN UA: NEGATIVE
pH, UA: 5

## 2015-10-29 LAB — CBC
HCT: 39.6 % (ref 36.0–46.0)
Hemoglobin: 13.8 g/dL (ref 12.0–15.0)
MCH: 28.6 pg (ref 26.0–34.0)
MCHC: 34.8 g/dL (ref 30.0–36.0)
MCV: 82.2 fL (ref 78.0–100.0)
MPV: 9.8 fL (ref 8.6–12.4)
Platelets: 284 10*3/uL (ref 150–400)
RBC: 4.82 MIL/uL (ref 3.87–5.11)
RDW: 13.7 % (ref 11.5–15.5)
WBC: 6.1 10*3/uL (ref 4.0–10.5)

## 2015-10-29 LAB — VITAMIN D 25 HYDROXY (VIT D DEFICIENCY, FRACTURES): VIT D 25 HYDROXY: 13 ng/mL — AB (ref 30–100)

## 2015-10-29 MED ORDER — ETONOGESTREL-ETHINYL ESTRADIOL 0.12-0.015 MG/24HR VA RING: VAGINAL_RING | VAGINAL | Status: DC

## 2015-10-29 NOTE — Patient Instructions (Signed)

## 2015-10-29 NOTE — Progress Notes (Signed)
44 y.o. [id number] Single  African American Fe here for annual exam. Periods normal no issues. Contraception working well.  Sees Urgent care if needed. Plans to establish with PCP in next year. No STD concerns, but screening requested. Would like screening labs today. Interested in possible BTL, but will continue Nuvaring until then. NO HSV outbreaks, no Rx needed at present. No other health issues today.   Patient's last menstrual period was 09/30/2015.          Sexually active: Yes.    The current method of family planning is NuvaRing vaginal inserts.    Exercising: No.  exercise Smoker:  no  Health Maintenance: Pap:  10-26-14 neg MMG:  12-22-14 category b density,birads 1:neg Colonoscopy:  none BMD:   none TDaP:  2014 Labs: Poct urine-neg Self breast exam: done occ   reports that she has never smoked. She has never used smokeless tobacco. She reports that she drinks about 1.8 oz of alcohol per week. She reports that she does not use illicit drugs.  Past Medical History  Diagnosis Date  . Asthma   . STD (sexually transmitted disease)     HSV2  . Depression   . Abnormal Pap smear     many yrs ago    Past Surgical History  Procedure Laterality Date  . Colposcopy    . Cryotherapy      for abnormal pap  . Dilation and curettage of uterus    . Cholecystectomy N/A 05/11/2014    Procedure: LAPAROSCOPIC CHOLECYSTECTOMY WITH INTRAOPERATIVE CHOLANGIOGRAM;  Surgeon: Zenovia Jarred, MD;  Location: [facility];  Service: General;  Laterality: N/A;  . Cholecystectomy  04/2014    Current Outpatient Prescriptions  Medication Sig Dispense Refill  . Acetaminophen (TYLENOL PO) Take by mouth as needed.    . etonogestrel-ethinyl estradiol (NUVARING) 0.12-0.015 MG/24HR vaginal ring Insert vaginally for 21 days then remove for 7 days 1 each 12  . ibuprofen (ADVIL,MOTRIN) 200 MG tablet Take 200 mg by mouth as needed.    . Multiple Vitamins-Minerals (MULTIVITAMIN PO) Take by mouth daily.    .  valACYclovir (VALTREX) 1000 MG tablet Take 1 tablet (1,000 mg total) by mouth daily. 45 tablet 12   No current facility-administered medications for this visit.    Family History  Problem Relation Age of Onset  . Diabetes Mother   . Cancer Mother     leukemia  . Lupus Mother   . Hypertension Mother   . Hypertension Father   . Breast cancer Maternal Aunt     ROS:  Pertinent items are noted in HPI.  Otherwise, a comprehensive ROS was negative.  Exam:   BP 120/78 mmHg  Pulse 70  Resp 16  Ht 5' 1.75" (1.568 m)  Wt 169 lb (76.658 kg)  BMI 31.18 kg/m2  LMP 09/30/2015 Height: 5' 1.75" (156.8 cm) Ht Readings from Last 3 Encounters:  10/29/15 5' 1.75" (1.568 m)  10/26/14 5' 1.25" (1.556 m)  07/18/14 5' 1.75" (1.568 m)    General appearance: alert, cooperative and appears stated age Head: Normocephalic, without obvious abnormality, atraumatic Neck: no adenopathy, supple, symmetrical, trachea midline and thyroid normal to inspection and palpation and non-palpable Lungs: clear to auscultation bilaterally Breasts: normal appearance, no masses or tenderness, No nipple retraction or dimpling, No nipple discharge or bleeding, No axillary or supraclavicular adenopathy Heart: regular rate and rhythm Abdomen: soft, non-tender; no masses,  no organomegaly Extremities: extremities normal, atraumatic, no cyanosis or edema Skin: Skin color, texture, turgor normal.  No rashes or lesions Lymph nodes: Cervical, supraclavicular, and axillary nodes normal. No abnormal inguinal nodes palpated Neurologic: Grossly normal   Pelvic: External genitalia:  no lesions              Urethra:  normal appearing urethra with no masses, tenderness or lesions              Bartholin's and Skene's: normal                 Vagina: normal appearing vagina with normal color and discharge, no lesions              Cervix: normal, non tender, no lesions              Pap taken: No. Bimanual Exam:  Uterus:  normal size,  contour, position, consistency, mobility, non-tender              Adnexa: normal adnexa and no mass, fullness, tenderness               Rectovaginal: Confirms               Anus:  normal sphincter tone, no lesions  Chaperone present: yes  A:  Well Woman with normal exam  Contraception Nuvaring interested in BTL  STD screening  Screening labs  P:   Reviewed health and wellness pertinent to exam  Rx Nuvaring see order  Discussed risk/benefits/general information regarding procedure and usually outpatient. Questions addressed. Patient will check with her insurance company and advise if would like to proceed.  Lab GC,Chlamydia  Labs: CBC, Lipid panel Vitamin D  Pap smear as above not taken   counseled on breast self exam, mammography screening, adequate intake of calcium and vitamin D, diet and exercise  return annually or prn  An After Visit Summary was printed and given to the patient.

## 2015-10-30 NOTE — Progress Notes (Signed)
Reviewed personally.  M. Suzanne Sumeya Yontz, MD.  

## 2015-10-31 LAB — IPS N GONORRHOEA AND CHLAMYDIA BY PCR

## 2015-11-01 ENCOUNTER — Telehealth: Payer: Self-pay | Admitting: Certified Nurse Midwife

## 2015-11-01 ENCOUNTER — Other Ambulatory Visit: Payer: Self-pay

## 2015-11-01 ENCOUNTER — Telehealth: Payer: Self-pay

## 2015-11-01 DIAGNOSIS — E559 Vitamin D deficiency, unspecified: Secondary | ICD-10-CM

## 2015-11-01 MED ORDER — VITAMIN D (ERGOCALCIFEROL) 1.25 MG (50000 UNIT) PO CAPS: 50000.0000 [IU] | ORAL_CAPSULE | ORAL | Status: DC

## 2015-11-01 NOTE — Telephone Encounter (Signed)
Patient is wanting you to send a prescription of vitamin d 50,000 to her [facility] pharmacy on [address] and [address]. Best # to reach patient: [phone]

## 2015-11-01 NOTE — Telephone Encounter (Signed)
lmtcb

## 2015-11-01 NOTE — Telephone Encounter (Signed)
-----   Message from Regina Eck, CNM sent at 10/30/2015  8:21 AM EST ----- Notify patient her vitamin D is very low. Start on 1000 IU Vitamin D 3 OTC daily and recheck per protocol. When low this increases fatigue and depression sometimes. Cholesterol panel optimal level, cholesterol borderline high at 213, but HDL is 94 normal is 46 this her protective cholesterol CBC is normal, no anemia! GC,Chlamydia pending

## 2015-11-01 NOTE — Telephone Encounter (Signed)
Patient notified of results as written by provider 

## 2015-11-01 NOTE — Telephone Encounter (Signed)
agreeable

## 2015-11-01 NOTE — Telephone Encounter (Signed)
Pt prefers to take vit d 50,000iu once weekly for 50mths then recheck level rather than take otc right now since vit d is 13. Pt aware rx sent to pharmacy.

## 2015-11-01 NOTE — Telephone Encounter (Signed)
Patient returning your call [phone]

## 2015-11-01 NOTE — Telephone Encounter (Signed)
See vitamin d labwork.

## 2015-12-06 ENCOUNTER — Other Ambulatory Visit: Payer: Self-pay | Admitting: Certified Nurse Midwife

## 2015-12-06 NOTE — Telephone Encounter (Signed)
Medication refill request: valtrex  Last AEX:  10/29/15 DL Next AEX: 10/29/16 DL Last MMG (if hormonal medication request): 12/22/14 BIRADS1:neg Refill authorized: 10/26/14 #[id number]/ 12R. Today please advise.

## 2016-01-30 ENCOUNTER — Other Ambulatory Visit: Payer: 59

## 2016-02-06 ENCOUNTER — Telehealth: Payer: Self-pay | Admitting: *Deleted

## 2016-02-06 NOTE — Telephone Encounter (Signed)
Patient called to reschedule lab appointment for 02/10/16 at 11:00.

## 2016-02-07 ENCOUNTER — Other Ambulatory Visit: Payer: 59

## 2016-02-10 ENCOUNTER — Other Ambulatory Visit (INDEPENDENT_AMBULATORY_CARE_PROVIDER_SITE_OTHER): Payer: 59

## 2016-02-10 DIAGNOSIS — E559 Vitamin D deficiency, unspecified: Secondary | ICD-10-CM

## 2016-02-11 ENCOUNTER — Telehealth: Payer: Self-pay

## 2016-02-11 LAB — VITAMIN D 25 HYDROXY (VIT D DEFICIENCY, FRACTURES): Vit D, 25-Hydroxy: 33 ng/mL (ref 30–100)

## 2016-02-11 NOTE — Telephone Encounter (Signed)
-----   Message from Regina Eck, CNM sent at 02/11/2016  7:24 AM EDT ----- Notify patient vitamin D much better from 13 to 33 which is borderline normal needs to stay on daily Vitamin D3 1000 IU to maintain, recheck at [facility]

## 2016-02-11 NOTE — Telephone Encounter (Signed)
lmtcb

## 2016-02-12 NOTE — Telephone Encounter (Signed)
Patient notified of results. See lab 

## 2016-03-30 ENCOUNTER — Ambulatory Visit (INDEPENDENT_AMBULATORY_CARE_PROVIDER_SITE_OTHER): Payer: 59 | Admitting: Physician Assistant

## 2016-03-30 VITALS — BP 148/98 | HR 78 | Temp 98.4°F | Resp 16 | Ht 62.0 in | Wt 177.0 lb

## 2016-03-30 DIAGNOSIS — I10 Essential (primary) hypertension: Secondary | ICD-10-CM

## 2016-03-30 DIAGNOSIS — R030 Elevated blood-pressure reading, without diagnosis of hypertension: Secondary | ICD-10-CM

## 2016-03-30 DIAGNOSIS — IMO0001 Reserved for inherently not codable concepts without codable children: Secondary | ICD-10-CM

## 2016-03-30 LAB — POCT URINALYSIS DIP (MANUAL ENTRY)
Bilirubin, UA: NEGATIVE
GLUCOSE UA: NEGATIVE
Ketones, POC UA: NEGATIVE
Leukocytes, UA: NEGATIVE
NITRITE UA: NEGATIVE
PROTEIN UA: NEGATIVE
SPEC GRAV UA: 1.015
UROBILINOGEN UA: 0.2
pH, UA: 6

## 2016-03-30 LAB — POCT URINE PREGNANCY: Preg Test, Ur: NEGATIVE

## 2016-03-30 MED ORDER — AMLODIPINE BESYLATE 5 MG PO TABS: 5.0000 mg | ORAL_TABLET | Freq: Every day | ORAL | Status: DC

## 2016-03-30 NOTE — Patient Instructions (Addendum)
IF you received an x-ray today, you will receive an invoice from [facility]. Please contact [facility] at [phone] with questions or concerns regarding your invoice.   IF you received labwork today, you will receive an invoice from [employer]. Please contact [facility] at [phone] with questions or concerns regarding your invoice.   Our billing staff will not be able to assist you with questions regarding bills from these companies.  You will be contacted with the lab results as soon as they are available. The fastest way to get your results is to activate your My Chart account. Instructions are located on the last page of this paperwork. If you have not heard from [place] regarding the results in 2 weeks, please contact this office.    Take blood pressure as prescribed.  Please track your blood pressure twice per week. I would like you to follow up in 2-3 weeks for blood pressure recheck. Please refer to the DASH diet below. You should be exercising 4 times per week with 30 minutes of aerobic (heart pumping) activity, and 2 days of resistance training (water) Watch your soda intake, this is a Advertising account executive.  Amlodipine tablets What is this medicine? AMLODIPINE (am LOE di peen) is a calcium-channel blocker. It affects the amount of calcium found in your heart and muscle cells. This relaxes your blood vessels, which can reduce the amount of work the heart has to do. This medicine is used to lower high blood pressure. It is also used to prevent chest pain. This medicine may be used for other purposes; ask your health care provider or pharmacist if you have questions. What should I tell my health care provider before I take this medicine? They need to know if you have any of these conditions: -heart problems like heart failure or aortic stenosis -liver disease -an unusual or allergic reaction to amlodipine, other medicines, foods,  dyes, or preservatives -pregnant or trying to get pregnant -breast-feeding How should I use this medicine? Take this medicine by mouth with a glass of water. Follow the directions on the prescription label. Take your medicine at regular intervals. Do not take more medicine than directed. Talk to your pediatrician regarding the use of this medicine in children. Special care may be needed. This medicine has been used in children as young as 6. Persons over 77 years old may have a stronger reaction to this medicine and need smaller doses. Overdosage: If you think you have taken too much of this medicine contact a poison control center or emergency room at once. NOTE: This medicine is only for you. Do not share this medicine with others. What if I miss a dose? If you miss a dose, take it as soon as you can. If it is almost time for your next dose, take only that dose. Do not take double or extra doses. What may interact with this medicine? -herbal or dietary supplements -local or general anesthetics -medicines for high blood pressure -medicines for prostate problems -rifampin This list may not describe all possible interactions. Give your health care provider a list of all the medicines, herbs, non-prescription drugs, or dietary supplements you use. Also tell them if you smoke, drink alcohol, or use illegal drugs. Some items may interact with your medicine. What should I watch for while using this medicine? Visit your doctor or health care professional for regular check ups. Check your blood pressure and pulse rate regularly. Ask your health care professional  what your blood pressure and pulse rate should be, and when you should contact him or her. This medicine may make you feel confused, dizzy or lightheaded. Do not drive, use machinery, or do anything that needs mental alertness until you know how this medicine affects you. To reduce the risk of dizzy or fainting spells, do not sit or stand up  quickly, especially if you are an older patient. Avoid alcoholic drinks; they can make you more dizzy. Do not suddenly stop taking amlodipine. Ask your doctor or health care professional how you can gradually reduce the dose. What side effects may I notice from receiving this medicine? Side effects that you should report to your doctor or health care professional as soon as possible: -allergic reactions like skin rash, itching or hives, swelling of the face, lips, or tongue -breathing problems -changes in vision or hearing -chest pain -fast, irregular heartbeat -swelling of legs or ankles Side effects that usually do not require medical attention (report to your doctor or health care professional if they continue or are bothersome): -dry mouth -facial flushing -nausea, vomiting -stomach gas, pain -tired, weak -trouble sleeping This list may not describe all possible side effects. Call your doctor for medical advice about side effects. You may report side effects to FDA at 1-800-FDA-1088. Where should I keep my medicine? Keep out of the reach of children. Store at room temperature between 59 and 86 degrees F (15 and 30 degrees C). Protect from light. Keep container tightly closed. Throw away any unused medicine after the expiration date. NOTE: This sheet is a summary. It may not cover all possible information. If you have questions about this medicine, talk to your doctor, pharmacist, or health care provider.    2016, Elsevier/Gold Standard. (2012-10-07 11:40:58)

## 2016-03-30 NOTE — Progress Notes (Signed)
[facility] [address], [place] [address] [phone]  Date:  03/30/2016   Name:  Beth Frost   DOB:  10-Jun-1971   MRN:  [id number]  PCP:  No PCP Per Patient    History of Present Illness:  Beth Frost is a 45 y.o. female patient who presents to [facility] for cc of elevated blood pressure.  Her blood pressure has been high off and on.  150/107 at 2pm, 157/117 around 11 am.   She has been feeling lightheaded and dizzy intermittently for the last month.  A slight headache.  [place] 15 minutes sitting prior to her blood pressure.  She has not had blurry vision or vision changes.  No chest pains, palpitations, sob, or leg swelling, or blurriness.  No change in her urine.  No recent upper respiratory symptoms.  No new meds.  Non-smoker. 3 glasses of wine per week confirmed.  She was exercising, and maintaining.     Patient Active Problem List   Diagnosis Date Noted  . Herpes genitalis in women 07/18/2014    Class: History of  . Acute cholecystitis 05/11/2014  . Symptomatic cholelithiasis 05/02/2014    Past Medical History  Diagnosis Date  . Asthma   . STD (sexually transmitted disease)     HSV2  . Depression   . Abnormal Pap smear     many yrs ago    Past Surgical History  Procedure Laterality Date  . Colposcopy    . Cryotherapy      for abnormal pap  . Dilation and curettage of uterus    . Cholecystectomy N/A 05/11/2014    Procedure: LAPAROSCOPIC CHOLECYSTECTOMY WITH INTRAOPERATIVE CHOLANGIOGRAM;  Surgeon: Zenovia Jarred, MD;  Location: [place];  Service: General;  Laterality: N/A;  . Cholecystectomy  04/2014    Social History  Substance Use Topics  . Smoking status: Never Smoker   . Smokeless tobacco: Never Used  . Alcohol Use: 1.8 oz/week    3 Glasses of wine per week    Family History  Problem Relation Age of Onset  . Diabetes Mother   . Cancer Mother     leukemia  . Lupus Mother   . Hypertension Mother   . Hypertension  Father   . Breast cancer Maternal Aunt     No Known Allergies  Medication list has been reviewed and updated.  Current Outpatient Prescriptions on File Prior to Visit  Medication Sig Dispense Refill  . Acetaminophen (TYLENOL PO) Take by mouth as needed.    . etonogestrel-ethinyl estradiol (NUVARING) 0.12-0.015 MG/24HR vaginal ring Insert vaginally for 21 days then remove for 7 days 1 each 12  . ibuprofen (ADVIL,MOTRIN) 200 MG tablet Take 200 mg by mouth as needed.    . Multiple Vitamins-Minerals (MULTIVITAMIN PO) Take by mouth daily.    . valACYclovir (VALTREX) 1000 MG tablet TAKE 1 TABLET BY MOUTH DAILY 45 tablet 6  . Vitamin D, Ergocalciferol, (DRISDOL) 50000 UNITS CAPS capsule Take 1 capsule (50,000 Units total) by mouth every 7 (seven) days. (Patient not taking: Reported on 03/30/2016) 12 capsule 0   No current facility-administered medications on file prior to visit.    ROS ROS otherwise unremarkable unless listed above.    Physical Examination: BP 148/98 mmHg  Pulse 78  Temp(Src) 98.4 F (36.9 C) (Oral)  Resp 16  Ht 5\' 2"  (1.575 m)  Wt 177 lb (80.287 kg)  BMI 32.37 kg/m2  SpO2 99%  LMP 03/07/2016 (Approximate) Ideal Body  Weight: Weight in (lb) to have BMI = 25: 136.4  Physical Exam  Constitutional: She is oriented to person, place, and time. She appears well-developed and well-nourished. No distress.  HENT:  Head: Normocephalic and atraumatic.  Right Ear: External ear normal.  Left Ear: External ear normal.  Eyes: Conjunctivae and EOM are normal. Pupils are equal, round, and reactive to light.  Cardiovascular: Normal rate and regular rhythm.  Exam reveals no gallop and no friction rub.   No murmur heard. Pulses:      Radial pulses are 2+ on the right side, and 2+ on the left side.       Dorsalis pedis pulses are 2+ on the right side, and 2+ on the left side.  Pulmonary/Chest: Effort normal. No respiratory distress.  Musculoskeletal: She exhibits no edema.   Neurological: She is alert and oriented to person, place, and time.  Skin: She is not diaphoretic.  Psychiatric: She has a normal mood and affect. Her behavior is normal.     Assessment and Plan: Beth Frost is a 45 y.o. female who is here today for cc of elevated blood pressure. We will start her on blood pressure medication.  Advised to rtc in 2 weeks for recheck.   Essential hypertension - Plan: amLODipine (NORVASC) 5 MG tablet  Elevated BP - Plan: POCT urine pregnancy, POCT urinalysis dipstick, CBC, COMPLETE METABOLIC PANEL WITH GFR, amLODipine (NORVASC) 5 MG tablet   Ivar Drape, PA-C [facility] 03/30/2016 5:34 PM

## 2016-03-31 LAB — COMPLETE METABOLIC PANEL WITH GFR
ALBUMIN: 3.8 g/dL (ref 3.6–5.1)
ALK PHOS: 53 U/L (ref 33–115)
ALT: 9 U/L (ref 6–29)
AST: 14 U/L (ref 10–30)
BILIRUBIN TOTAL: 0.5 mg/dL (ref 0.2–1.2)
BUN: 13 mg/dL (ref 7–25)
CO2: 23 mmol/L (ref 20–31)
Calcium: 8.8 mg/dL (ref 8.6–10.2)
Chloride: 104 mmol/L (ref 98–110)
Creat: 0.74 mg/dL (ref 0.50–1.10)
Glucose, Bld: 79 mg/dL (ref 65–99)
Potassium: 4 mmol/L (ref 3.5–5.3)
SODIUM: 136 mmol/L (ref 135–146)
TOTAL PROTEIN: 7.3 g/dL (ref 6.1–8.1)

## 2016-03-31 LAB — CBC
HEMATOCRIT: 41.8 % (ref 35.0–45.0)
HEMOGLOBIN: 13.8 g/dL (ref 11.7–15.5)
MCH: 28 pg (ref 27.0–33.0)
MCHC: 33 g/dL (ref 32.0–36.0)
MCV: 85 fL (ref 80.0–100.0)
MPV: 9.9 fL (ref 7.5–12.5)
Platelets: 298 10*3/uL (ref 140–400)
RBC: 4.92 MIL/uL (ref 3.80–5.10)
RDW: 14.2 % (ref 11.0–15.0)
WBC: 8.6 10*3/uL (ref 3.8–10.8)

## 2016-04-17 ENCOUNTER — Ambulatory Visit (INDEPENDENT_AMBULATORY_CARE_PROVIDER_SITE_OTHER): Payer: 59 | Admitting: Urgent Care

## 2016-04-17 VITALS — BP 123/78 | HR 83 | Temp 98.0°F | Resp 16 | Ht 62.0 in | Wt 169.0 lb

## 2016-04-17 DIAGNOSIS — I10 Essential (primary) hypertension: Secondary | ICD-10-CM | POA: Diagnosis not present

## 2016-04-17 DIAGNOSIS — R42 Dizziness and giddiness: Secondary | ICD-10-CM

## 2016-04-17 NOTE — Progress Notes (Signed)
    MRN: [id number] DOB: Dec 26, 1970  Subjective:   Beth Frost is a 45 y.o. female presenting for follow up on HTN.   Reports having been started on amlodipine ~2 weeks ago for hypertension. Patient still has intermittent dizziness. Checks her BP at work when she feels dizzy, runs in the [id number] systolic. 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 She has made significant dietary changes. She has not started exercising yet due to some of her dizziness. Denies chronic headache, chest pain, shortness of breath, heart racing, palpitations, nausea, vomiting, abdominal pain, hematuria, lower leg swelling.   Beth Frost has a current medication list which includes the following prescription(s): acetaminophen, amlodipine, etonogestrel-ethinyl estradiol, ibuprofen, multiple vitamins-minerals, and valacyclovir. Also has No Known Allergies.  Beth Frost  has a past medical history of Asthma; STD (sexually transmitted disease); Depression; and Abnormal Pap smear. Also  has past surgical history that includes Colposcopy; Cryotherapy; Dilation and curettage of uterus; Cholecystectomy (N/A, 05/11/2014); and Cholecystectomy (04/2014).  Objective:   Vitals: BP 123/78 mmHg  Pulse 83  Temp(Src) 98 F (36.7 C)  Resp 16  Ht 5\' 2"  (1.575 m)  Wt 169 lb (76.658 kg)  BMI 30.90 kg/m2  SpO2 100%  LMP 03/07/2016 (Approximate)  BP Readings from Last 3 Encounters:  04/17/16 123/78  03/30/16 148/98  10/29/15 120/78   Wt Readings from Last 3 Encounters:  04/17/16 169 lb (76.658 kg)  03/30/16 177 lb (80.287 kg)  10/29/15 169 lb (76.658 kg)    Physical Exam  Constitutional: She is oriented to person, place, and time. She appears well-developed and well-nourished.  HENT:  Mouth/Throat: Oropharynx is clear and moist.  Eyes: No scleral icterus.  Cardiovascular: Normal rate, regular rhythm and intact distal pulses.  Exam reveals no gallop and no friction rub.   No murmur  heard. Pulmonary/Chest: No respiratory distress. She has no wheezes. She has no rales.  Musculoskeletal: She exhibits no edema.  Neurological: She is alert and oriented to person, place, and time.  Skin: Skin is warm and dry.   Assessment and Plan :   1. Essential hypertension 2. Dizziness - Dizziness is improved, BP is controlled. Continue current regimen. Continue dietary modifications. Start light exercise. RTC in 2 weeks if dizziness persists. Otherwise, use refill of amlodipine and come to clinic in 6 weeks. At that point, if patient is doing well, provide with 90 day supply. Patient in agreement.  Jaynee Eagles, PA-C [facility] [phone] 04/17/2016 5:50 PM

## 2016-04-17 NOTE — Patient Instructions (Addendum)
Hypertension Hypertension, commonly called high blood pressure, is when the force of blood pumping through your arteries is too strong. Your arteries are the blood vessels that carry blood from your heart throughout your body. A blood pressure reading consists of a higher number over a lower number, such as 110/72. The higher number (systolic) is the pressure inside your arteries when your heart pumps. The lower number (diastolic) is the pressure inside your arteries when your heart relaxes. Ideally you want your blood pressure below 120/80. Hypertension forces your heart to work harder to pump blood. Your arteries may become narrow or stiff. Having untreated or uncontrolled hypertension can cause heart attack, stroke, kidney disease, and other problems. RISK FACTORS Some risk factors for high blood pressure are controllable. Others are not.  Risk factors you cannot control include:   Race. You may be at higher risk if you are African American.  Age. Risk increases with age.  Gender. Men are at higher risk than women before age 45 years. After age 65, women are at higher risk than men. Risk factors you can control include:  Not getting enough exercise or physical activity.  Being overweight.  Getting too much fat, sugar, calories, or salt in your diet.  Drinking too much alcohol. SIGNS AND SYMPTOMS Hypertension does not usually cause signs or symptoms. Extremely high blood pressure (hypertensive crisis) may cause headache, anxiety, shortness of breath, and nosebleed. DIAGNOSIS To check if you have hypertension, your health care provider will measure your blood pressure while you are seated, with your arm held at the level of your heart. It should be measured at least twice using the same arm. Certain conditions can cause a difference in blood pressure between your right and left arms. A blood pressure reading that is higher than normal on one occasion does not mean that you need treatment. If  it is not clear whether you have high blood pressure, you may be asked to return on a different day to have your blood pressure checked again. Or, you may be asked to monitor your blood pressure at home for 1 or more weeks. TREATMENT Treating high blood pressure includes making lifestyle changes and possibly taking medicine. Living a healthy lifestyle can help lower high blood pressure. You may need to change some of your habits. Lifestyle changes may include:  Following the DASH diet. This diet is high in fruits, vegetables, and whole grains. It is low in salt, red meat, and added sugars.  Keep your sodium intake below 2,300 mg per day.  Getting at least 30-45 minutes of aerobic exercise at least 4 times per week.  Losing weight if necessary.  Not smoking.  Limiting alcoholic beverages.  Learning ways to reduce stress. Your health care provider may prescribe medicine if lifestyle changes are not enough to get your blood pressure under control, and if one of the following is true:  You are 18-59 years of age and your systolic blood pressure is above 140.  You are 60 years of age or older, and your systolic blood pressure is above 150.  Your diastolic blood pressure is above 90.  You have diabetes, and your systolic blood pressure is over 140 or your diastolic blood pressure is over 90.  You have kidney disease and your blood pressure is above 140/90.  You have heart disease and your blood pressure is above 140/90. Your personal target blood pressure may vary depending on your medical conditions, your age, and other factors. HOME CARE INSTRUCTIONS    Have your blood pressure rechecked as directed by your health care provider.   Take medicines only as directed by your health care provider. Follow the directions carefully. Blood pressure medicines must be taken as prescribed. The medicine does not work as well when you skip doses. Skipping doses also puts you at risk for  problems.  Do not smoke.   Monitor your blood pressure at home as directed by your health care provider. SEEK MEDICAL CARE IF:   You think you are having a reaction to medicines taken.  You have recurrent headaches or feel dizzy.  You have swelling in your ankles.  You have trouble with your vision. SEEK IMMEDIATE MEDICAL CARE IF:  You develop a severe headache or confusion.  You have unusual weakness, numbness, or feel faint.  You have severe chest or abdominal pain.  You vomit repeatedly.  You have trouble breathing. MAKE SURE YOU:   Understand these instructions.  Will watch your condition.  Will get help right away if you are not doing well or get worse.   This information is not intended to replace advice given to you by your health care provider. Make sure you discuss any questions you have with your health care provider.   Document Released: 11/09/2005 Document Revised: 03/26/2015 Document Reviewed: 09/01/2013 Elsevier Interactive Patient Education 2016 Elsevier Inc.     IF you received an x-ray today, you will receive an invoice from [facility]. Please contact [facility] at [phone] with questions or concerns regarding your invoice.   IF you received labwork today, you will receive an invoice from [facility]. Please contact [facility] at [phone] with questions or concerns regarding your invoice.   Our billing staff will not be able to assist you with questions regarding bills from these companies.  You will be contacted with the lab results as soon as they are available. The fastest way to get your results is to activate your My Chart account. Instructions are located on the last page of this paperwork. If you have not heard from us regarding the results in 2 weeks, please contact this office.      

## 2016-05-25 ENCOUNTER — Other Ambulatory Visit: Payer: Self-pay | Admitting: Physician Assistant

## 2016-06-23 ENCOUNTER — Other Ambulatory Visit: Payer: Self-pay | Admitting: Physician Assistant

## 2016-07-29 ENCOUNTER — Other Ambulatory Visit: Payer: Self-pay | Admitting: Emergency Medicine

## 2016-08-03 ENCOUNTER — Other Ambulatory Visit: Payer: Self-pay

## 2016-08-03 ENCOUNTER — Telehealth: Payer: Self-pay

## 2016-08-03 MED ORDER — AMLODIPINE BESYLATE 5 MG PO TABS: 5.0000 mg | ORAL_TABLET | Freq: Every day | ORAL | 1 refills | Status: DC

## 2016-08-03 NOTE — Telephone Encounter (Signed)
Pt has been without BP meds for 3 days now. She said that her pharmacy has sent over a request. She called them and they said they are waiting on our confirmation.  Please advise  [phone]

## 2016-08-04 NOTE — Telephone Encounter (Signed)
Meds sent in yesterday.

## 2016-09-10 ENCOUNTER — Encounter: Payer: Self-pay | Admitting: Certified Nurse Midwife

## 2016-10-27 ENCOUNTER — Other Ambulatory Visit: Payer: Self-pay | Admitting: Certified Nurse Midwife

## 2016-10-27 DIAGNOSIS — Z3049 Encounter for surveillance of other contraceptives: Secondary | ICD-10-CM

## 2016-10-27 NOTE — Telephone Encounter (Signed)
Patient is requesting a refill of Nuvoring until her appointment 11/19/16. Confirmed pharmacy on file.

## 2016-10-27 NOTE — Telephone Encounter (Signed)
Needs phone call. Is she on hypertensive meds. Per chart? If so needs to change her contraception and she may want to wait until aex to discuss. But should come off her Nuvaring and change to Progesterone only

## 2016-10-27 NOTE — Telephone Encounter (Signed)
Medication refill request: Nuvaring Last AEX:  10/29/15 DL Next AEX: 11/19/16 DL Last MMG (if hormonal medication request): 09/07/16 [id number] Density B, [facility]  Refill authorized: 10/29/15 #1 12R. Please advise. Thank you.

## 2016-10-28 NOTE — Telephone Encounter (Signed)
Left message for patient to call me back. -sco

## 2016-10-29 ENCOUNTER — Ambulatory Visit: Payer: 59 | Admitting: Certified Nurse Midwife

## 2016-10-30 MED ORDER — ETONOGESTREL-ETHINYL ESTRADIOL 0.12-0.015 MG/24HR VA RING: VAGINAL_RING | VAGINAL | 0 refills | Status: DC

## 2016-10-30 NOTE — Telephone Encounter (Signed)
Needs AEX

## 2016-10-30 NOTE — Telephone Encounter (Signed)
Spoke with patient. She is currently taking Amlodipine for HTN. She said she would like to discuss with you a different type of BC at her AEX.

## 2016-11-02 NOTE — Telephone Encounter (Signed)
Patient has AEX scheduled for 11/19/16 with DL. -sco

## 2016-11-19 ENCOUNTER — Ambulatory Visit: Payer: 59 | Admitting: Certified Nurse Midwife

## 2016-11-25 NOTE — Progress Notes (Signed)
46 y.o. Beth Frost Single  African American Fe here for annual exam. Periods normal, no issue. Contraception Nuvaring working well. No partner change for 15 years. May eventually marry. Sees Urgent Care if needed and was seen for severe headaches. Was diagnosed with Hypertension and currently on medication. Needs PCP management and would like referral list. Headaches subsided and nor edema of hands and feet. 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 Screening labs if needed. No other health issues if needed.  Patient's last menstrual period was 11/11/2016 (exact date).          Sexually active: Yes.    The current method of family planning is NuvaRing vaginal inserts.    Exercising: No.  exercise Smoker:  no  Health Maintenance: Pap:  10-26-14 neg  MMG:  09-07-16 category b density birads 1:neg Colonoscopy:  none BMD:   none TDaP:  2014 Shingles: no Pneumonia: no Hep C and HIV: not done Labs: declined Self breast exam: done occ   reports that she has never smoked. She has never used smokeless tobacco. She reports that she drinks alcohol. She reports that she does not use drugs.  Past Medical History:  Diagnosis Date  . Abnormal Pap smear    many yrs ago  . Asthma   . Depression   . Hypertension   . STD (sexually transmitted disease)    HSV2    Past Surgical History:  Procedure Laterality Date  . CHOLECYSTECTOMY N/A 05/11/2014   Procedure: LAPAROSCOPIC CHOLECYSTECTOMY WITH INTRAOPERATIVE CHOLANGIOGRAM;  Surgeon: Zenovia Jarred, MD;  Location: [facility];  Service: General;  Laterality: N/A;  . CHOLECYSTECTOMY  04/2014  . COLPOSCOPY    . CRYOTHERAPY     for abnormal pap  . DILATION AND CURETTAGE OF UTERUS      Current Outpatient Prescriptions  Medication Sig Dispense Refill  . Acetaminophen (TYLENOL PO) Take by mouth as needed.    Marland Kitchen amLODipine (NORVASC) 5 MG tablet Take 1 tablet (5 mg total) by mouth daily. 90 tablet 1  . etonogestrel-ethinyl estradiol  (NUVARING) 0.12-0.015 MG/24HR vaginal ring Insert vaginally for 21 days then remove for 7 days 1 each 0  . ibuprofen (ADVIL,MOTRIN) 200 MG tablet Take 200 mg by mouth as needed.    . Multiple Vitamins-Minerals (MULTIVITAMIN PO) Take by mouth as needed.     . valACYclovir (VALTREX) 1000 MG tablet TAKE 1 TABLET BY MOUTH DAILY 45 tablet 6   No current facility-administered medications for this visit.     Family History  Problem Relation Age of Onset  . Diabetes Mother   . Cancer Mother     leukemia  . Lupus Mother   . Hypertension Mother   . Hypertension Father   . Breast cancer Maternal Aunt     ROS:  Pertinent items are noted in HPI.  Otherwise, a comprehensive ROS was negative.  Exam:   BP 118/64   Pulse 70   Resp 16   Ht 5' 1.75" (1.568 m)   Wt 176 lb (79.8 kg)   LMP 11/11/2016 (Exact Date)   BMI 32.45 kg/m  Height: 5' 1.75" (156.8 cm) Ht Readings from Last 3 Encounters:  11/26/16 5' 1.75" (1.568 m)  04/17/16 5\' 2"  (1.575 m)  03/30/16 5\' 2"  (1.575 m)    General appearance: alert, cooperative and appears stated age Head: Normocephalic, without obvious abnormality, atraumatic Neck: no adenopathy, supple, symmetrical, trachea midline and thyroid normal to inspection and palpation Lungs: clear to auscultation bilaterally  Breasts: normal appearance, no masses or tenderness, No nipple retraction or dimpling, No nipple discharge or bleeding, No axillary or supraclavicular adenopathy Heart: regular rate and rhythm Abdomen: soft, non-tender; no masses,  no organomegaly Extremities: extremities normal, atraumatic, no cyanosis or edema Skin: Skin color, texture, turgor normal. No rashes or lesions Lymph nodes: Cervical, supraclavicular, and axillary nodes normal. No abnormal inguinal nodes palpated Neurologic: Grossly normal   Pelvic: External genitalia:  no lesions              Urethra:  normal appearing urethra with no masses, tenderness or lesions              Bartholin's  and Skene's: normal                 Vagina: normal appearing vagina with normal color and discharge, no lesions              Cervix: multiparous appearance, no bleeding following Pap, no cervical motion tenderness and no lesions              Pap taken: Yes.   Bimanual Exam:  Uterus:  normal size, contour, position, consistency, mobility, non-tender              Adnexa: normal adnexa and no mass, fullness, tenderness               Rectovaginal: Confirms               Anus:  normal sphincter tone, no lesions  Chaperone present: yes  A:  Well Woman with normal exam  Contraception Nuvaring  Hypertension on medication needs PCP management and follow up.  History of HSV 2 needs Rx refill  Screening labs  P:   Reviewed health and wellness pertinent to exam  Discussed concerns with estrogen contraception and hypertension risks. Discussed would recommend changing to POP which decreases risk with controlled or uncontrolled hypertension. Patient feels she can do POP since she takes her medication daily and would like to try. Not interested in Depo, IUD or Nexplanon. Risks/benefits/bleeding profile of POP reviewed. Questions addressed. Rx Camilla see order with instructions  Discussed establishing with PCP for hypertension management. Patient given resource list and suggestions. Plans to schedule appointment in the next month if possible.   Rx Valtrex see order  Labs: Lipid panel, CMP, TSH  Pap smear as above with HPV reflex   counseled on breast self exam, mammography screening, adequate intake of calcium and vitamin D, diet and exercise  return annually or prn  An After Visit Summary was printed and given to the patient.

## 2016-11-26 ENCOUNTER — Ambulatory Visit (INDEPENDENT_AMBULATORY_CARE_PROVIDER_SITE_OTHER): Payer: PRIVATE HEALTH INSURANCE | Admitting: Certified Nurse Midwife

## 2016-11-26 ENCOUNTER — Encounter: Payer: Self-pay | Admitting: Certified Nurse Midwife

## 2016-11-26 VITALS — BP 118/64 | HR 70 | Resp 16 | Ht 61.75 in | Wt 176.0 lb

## 2016-11-26 DIAGNOSIS — Z01419 Encounter for gynecological examination (general) (routine) without abnormal findings: Secondary | ICD-10-CM | POA: Diagnosis not present

## 2016-11-26 DIAGNOSIS — Z30011 Encounter for initial prescription of contraceptive pills: Secondary | ICD-10-CM

## 2016-11-26 DIAGNOSIS — I10 Essential (primary) hypertension: Secondary | ICD-10-CM | POA: Diagnosis not present

## 2016-11-26 DIAGNOSIS — Z0000 Encounter for general adult medical examination without abnormal findings: Secondary | ICD-10-CM | POA: Diagnosis not present

## 2016-11-26 DIAGNOSIS — A6009 Herpesviral infection of other urogenital tract: Secondary | ICD-10-CM | POA: Diagnosis not present

## 2016-11-26 DIAGNOSIS — Z124 Encounter for screening for malignant neoplasm of cervix: Secondary | ICD-10-CM

## 2016-11-26 LAB — COMPREHENSIVE METABOLIC PANEL
ALK PHOS: 58 U/L (ref 33–115)
ALT: 11 U/L (ref 6–29)
AST: 13 U/L (ref 10–30)
Albumin: 4 g/dL (ref 3.6–5.1)
BUN: 6 mg/dL — AB (ref 7–25)
CHLORIDE: 106 mmol/L (ref 98–110)
CO2: 20 mmol/L (ref 20–31)
Calcium: 9.1 mg/dL (ref 8.6–10.2)
Creat: 0.56 mg/dL (ref 0.50–1.10)
GLUCOSE: 86 mg/dL (ref 65–99)
POTASSIUM: 4.1 mmol/L (ref 3.5–5.3)
Sodium: 139 mmol/L (ref 135–146)
Total Bilirubin: 1 mg/dL (ref 0.2–1.2)
Total Protein: 7.2 g/dL (ref 6.1–8.1)

## 2016-11-26 LAB — LIPID PANEL
CHOL/HDL RATIO: 2.1 ratio (ref <5.0)
Cholesterol: 211 mg/dL — ABNORMAL HIGH (ref <200)
HDL: 100 mg/dL (ref >50)
LDL CALC: 93 mg/dL (ref <100)
TRIGLYCERIDES: 89 mg/dL (ref <150)
VLDL: 18 mg/dL (ref <30)

## 2016-11-26 LAB — TSH: TSH: 1.81 mIU/L

## 2016-11-26 MED ORDER — NORETHINDRONE 0.35 MG PO TABS: 1.0000 | ORAL_TABLET | Freq: Every day | ORAL | 4 refills | Status: DC

## 2016-11-26 MED ORDER — VALACYCLOVIR HCL 1 G PO TABS: 1000.0000 mg | ORAL_TABLET | Freq: Every day | ORAL | 6 refills | Status: DC

## 2016-11-26 NOTE — Patient Instructions (Signed)

## 2016-11-27 ENCOUNTER — Ambulatory Visit (INDEPENDENT_AMBULATORY_CARE_PROVIDER_SITE_OTHER): Payer: PRIVATE HEALTH INSURANCE | Admitting: Family Medicine

## 2016-11-27 VITALS — BP 118/78 | HR 95 | Temp 98.8°F | Resp 18 | Ht 61.75 in | Wt 174.0 lb

## 2016-11-27 DIAGNOSIS — J0110 Acute frontal sinusitis, unspecified: Secondary | ICD-10-CM | POA: Diagnosis not present

## 2016-11-27 DIAGNOSIS — I10 Essential (primary) hypertension: Secondary | ICD-10-CM | POA: Diagnosis not present

## 2016-11-27 DIAGNOSIS — J4520 Mild intermittent asthma, uncomplicated: Secondary | ICD-10-CM

## 2016-11-27 DIAGNOSIS — J029 Acute pharyngitis, unspecified: Secondary | ICD-10-CM | POA: Diagnosis not present

## 2016-11-27 LAB — POCT RAPID STREP A (OFFICE): RAPID STREP A SCREEN: NEGATIVE

## 2016-11-27 MED ORDER — FLUCONAZOLE 150 MG PO TABS: 150.0000 mg | ORAL_TABLET | Freq: Once | ORAL | 0 refills | Status: AC

## 2016-11-27 MED ORDER — AMOXICILLIN-POT CLAVULANATE 875-125 MG PO TABS
1.0000 | ORAL_TABLET | Freq: Two times a day (BID) | ORAL | 0 refills | Status: DC
Start: 2016-11-27 — End: 2017-09-24

## 2016-11-27 MED ORDER — FLUTICASONE PROPIONATE 50 MCG/ACT NA SUSP: 2.0000 | Freq: Every day | NASAL | 6 refills | Status: DC

## 2016-11-27 MED ORDER — ALBUTEROL SULFATE HFA 108 (90 BASE) MCG/ACT IN AERS: 2.0000 | INHALATION_SPRAY | Freq: Four times a day (QID) | RESPIRATORY_TRACT | 0 refills | Status: DC | PRN

## 2016-11-27 NOTE — Patient Instructions (Addendum)
     IF you received an x-ray today, you will receive an invoice from [facility]. Please contact [facility] at [phone] with questions or concerns regarding your invoice.   IF you received labwork today, you will receive an invoice from [facility]. Please contact [facility] at [phone] with questions or concerns regarding your invoice.   Our billing staff will not be able to assist you with questions regarding bills from these companies.  You will be contacted with the lab results as soon as they are available. The fastest way to get your results is to activate your My Chart account. Instructions are located on the last page of this paperwork. If you have not heard from us regarding the results in 2 weeks, please contact this office.      Sinusitis, Adult Sinusitis is soreness and inflammation of your sinuses. Sinuses are hollow spaces in the bones around your face. They are located:  Around your eyes.  In the middle of your forehead.  Behind your nose.  In your cheekbones. Your sinuses and nasal passages are lined with a stringy fluid (mucus). Mucus normally drains out of your sinuses. When your nasal tissues get inflamed or swollen, the mucus can get trapped or blocked so air cannot flow through your sinuses. This lets bacteria, viruses, and funguses grow, and that leads to infection. Follow these instructions at home: Medicines   Take, use, or apply over-the-counter and prescription medicines only as told by your doctor. These may include nasal sprays.  If you were prescribed an antibiotic medicine, take it as told by your doctor. Do not stop taking the antibiotic even if you start to feel better. Hydrate and Humidify   Drink enough water to keep your pee (urine) clear or pale yellow.  Use a cool mist humidifier to keep the humidity level in your home above 50%.  Breathe in steam for 10-15 minutes, 3-4 times a day or as told by your doctor. You can do  this in the bathroom while a hot shower is running.  Try not to spend time in cool or dry air. Rest   Rest as much as possible.  Sleep with your head raised (elevated).  Make sure to get enough sleep each night. General instructions   Put a warm, moist washcloth on your face 3-4 times a day or as told by your doctor. This will help with discomfort.  Wash your hands often with soap and water. If there is no soap and water, use hand sanitizer.  Do not smoke. Avoid being around people who are smoking (secondhand smoke).  Keep all follow-up visits as told by your doctor. This is important. Contact a doctor if:  You have a fever.  Your symptoms get worse.  Your symptoms do not get better within 10 days. Get help right away if:  You have a very bad headache.  You cannot stop throwing up (vomiting).  You have pain or swelling around your face or eyes.  You have trouble seeing.  You feel confused.  Your neck is stiff.  You have trouble breathing. This information is not intended to replace advice given to you by your health care provider. Make sure you discuss any questions you have with your health care provider. Document Released: 04/27/2008 Document Revised: 07/05/2016 Document Reviewed: 09/04/2015 Elsevier Interactive Patient Education  2017 Elsevier Inc.  

## 2016-11-27 NOTE — Progress Notes (Signed)
Chief Complaint  Patient presents with  . Adenopathy  . Sore Throat  . Medication Refill    Norvasc    HPI  Pt reports that she has been having bilateral sore throat and seems worse on the right side.  She reports soreness around her maxillary and ethmoid sinus.  She reports that it is hard to swallow  No fevers or chills She states that she took a temperature reading overnight and her temp was 99 degrees.  She works in a [employer].   She has been feeling sick for about 3 days At home she tried ibuprofen for frontal sinus headache She took theraflu and nyquil for congestion. She reports that she only had minor relief of her symptoms.  She has a history of asthma and she has noted some wheezing.  She has not had any issues with her asthma in years so she no longer has her albuterol inhaler. She notices wheezing if she goes outside in the cold weather.   She is a nonsmoker   Hypertension: Patient here for follow-up of elevated blood pressure. She is exercising and is adherent to low salt diet.  Blood pressure is well controlled at home. Cardiac symptoms none. Patient denies chest pain, chest pressure/discomfort, exertional chest pressure/discomfort and palpitations.  Cardiovascular risk factors: hypertension. Use of agents associated with hypertension: none. History of target organ damage: none.  She checks her blood pressure at work and it is typically in the 110-120s. BP Readings from Last 3 Encounters:  11/27/16 118/78  11/26/16 118/64  04/17/16 123/78      Past Medical History:  Diagnosis Date  . Abnormal Pap smear    many yrs ago  . Asthma   . Depression   . Hypertension   . STD (sexually transmitted disease)    HSV2    Current Outpatient Prescriptions  Medication Sig Dispense Refill  . Acetaminophen (TYLENOL PO) Take by mouth as needed.    Marland Kitchen amLODipine (NORVASC) 5 MG tablet Take 1 tablet (5 mg total) by mouth daily. 90 tablet 1  . ibuprofen  (ADVIL,MOTRIN) 200 MG tablet Take 200 mg by mouth as needed.    . Multiple Vitamins-Minerals (MULTIVITAMIN PO) Take by mouth as needed.     . norethindrone (MICRONOR,CAMILA,ERRIN) 0.35 MG tablet Take 1 tablet (0.35 mg total) by mouth daily. 3 Package 4  . valACYclovir (VALTREX) 1000 MG tablet Take 1 tablet (1,000 mg total) by mouth daily. 45 tablet 6  . albuterol (PROVENTIL HFA;VENTOLIN HFA) 108 (90 Base) MCG/ACT inhaler Inhale 2 puffs into the lungs every 6 (six) hours as needed for wheezing or shortness of breath. 1 Inhaler 0  . amoxicillin-clavulanate (AUGMENTIN) 875-125 MG tablet Take 1 tablet by mouth 2 (two) times daily. 20 tablet 0  . fluconazole (DIFLUCAN) 150 MG tablet Take 1 tablet (150 mg total) by mouth once. Repeat 2nd dose in 3 days 2 tablet 0  . fluticasone (FLONASE) 50 MCG/ACT nasal spray Place 2 sprays into both nostrils daily. 16 g 6   No current facility-administered medications for this visit.     Allergies: No Known Allergies  Past Surgical History:  Procedure Laterality Date  . CHOLECYSTECTOMY N/A 05/11/2014   Procedure: LAPAROSCOPIC CHOLECYSTECTOMY WITH INTRAOPERATIVE CHOLANGIOGRAM;  Surgeon: Zenovia Jarred, MD;  Location: [facility];  Service: General;  Laterality: N/A;  . CHOLECYSTECTOMY  04/2014  . COLPOSCOPY    . CRYOTHERAPY     for abnormal pap  . DILATION AND CURETTAGE OF UTERUS  Social History   Social History  . Marital status: Single    Spouse name: N/A  . Number of children: N/A  . Years of education: N/A   Social History Main Topics  . Smoking status: Never Smoker  . Smokeless tobacco: Never Used  . Alcohol use Yes     Comment: 3-4 times a week (wine)  . Drug use: No  . Sexual activity: Yes    Partners: Male    Birth control/ protection: Inserts     Comment: nuvaring   Other Topics Concern  . None   Social History Narrative  . None    ROS  Objective: Vitals:   11/27/16 1041  BP: 118/78  Pulse: 95  Resp: 18  Temp: 98.8 F  (37.1 C)  TempSrc: Oral  SpO2: 98%  Weight: 174 lb (78.9 kg)  Height: 5' 1.75" (1.568 m)    Physical Exam General: alert, oriented, in NAD Head: normocephalic, atraumatic, + Frontal and maxillary sinus tenderness Eyes: EOM intact, no scleral icterus or conjunctival injection Ears: TM clear bilaterally Throat: no pharyngeal exudate or erythema Lymph: no posterior auricular, submental or cervical lymph adenopathy Heart: normal rate, normal sinus rhythm, no murmurs Lungs: clear to auscultation bilaterally, no wheezing  Rapid Strep Negative  Assessment and Plan Beth Frost was seen today for adenopathy, sore throat and medication refill.  Diagnoses and all orders for this visit:  Essential hypertension- discussed that she should increase her exercise and to do a drug holiday for one month and reassess her pressures  Sore throat- neg for strep -     POCT rapid strep A  Acute non-recurrent frontal sinusitis Mild intermittent asthma without complication -     amoxicillin-clavulanate (AUGMENTIN) 875-125 MG tablet; Take 1 tablet by mouth 2 (two) times daily. -     fluconazole (DIFLUCAN) 150 MG tablet; Take 1 tablet (150 mg total) by mouth once. Repeat 2nd dose in 3 days -     albuterol (PROVENTIL HFA;VENTOLIN HFA) 108 (90 Base) MCG/ACT inhaler; Inhale 2 puffs into the lungs every 6 (six) hours as needed for wheezing or shortness of breath. -     fluticasone (FLONASE) 50 MCG/ACT nasal spray; Place 2 sprays into both nostrils daily.     [place]

## 2016-11-30 LAB — IPS PAP TEST WITH HPV

## 2016-12-05 NOTE — Progress Notes (Signed)
Encounter reviewed Joretta Eads, MD   

## 2016-12-21 ENCOUNTER — Other Ambulatory Visit: Payer: Self-pay | Admitting: Nurse Practitioner

## 2017-02-09 ENCOUNTER — Telehealth: Payer: Self-pay | Admitting: Family Medicine

## 2017-02-09 NOTE — Telephone Encounter (Signed)
Pt calling for a 90 day refill on her Amlodipine she states that she always get a 90 day refill please respond

## 2017-02-10 MED ORDER — AMLODIPINE BESYLATE 5 MG PO TABS: 5.0000 mg | ORAL_TABLET | Freq: Every day | ORAL | 0 refills | Status: DC

## 2017-05-14 ENCOUNTER — Other Ambulatory Visit: Payer: Self-pay | Admitting: Family Medicine

## 2017-08-30 ENCOUNTER — Telehealth: Payer: Self-pay | Admitting: Family Medicine

## 2017-08-30 NOTE — Telephone Encounter (Signed)
Pt is calling to request a refill of her amlodipine.  Pt still uses the [facility] on [address] and [address].  Please advise

## 2017-08-31 NOTE — Telephone Encounter (Signed)
Spoke with pharmacy pt has one 90 day  Supply refill which will last her throughout Dec. 2018, called pt advised no refill is needed, she has a refill left and needs to call pharmacy to fill.  Advised to call and schedule appt with dr. Nolon Rod sometime in Jan. 2019.  Pt agreeable. Dgaddy, CMA

## 2017-09-24 ENCOUNTER — Encounter: Payer: Self-pay | Admitting: Certified Nurse Midwife

## 2017-09-24 ENCOUNTER — Ambulatory Visit: Payer: PRIVATE HEALTH INSURANCE | Admitting: Certified Nurse Midwife

## 2017-09-24 VITALS — BP 118/80 | HR 68 | Temp 98.3°F | Resp 16 | Ht 61.75 in | Wt 169.0 lb

## 2017-09-24 DIAGNOSIS — R102 Pelvic and perineal pain: Secondary | ICD-10-CM

## 2017-09-24 DIAGNOSIS — N390 Urinary tract infection, site not specified: Secondary | ICD-10-CM | POA: Diagnosis not present

## 2017-09-24 LAB — POCT URINALYSIS DIPSTICK
BILIRUBIN UA: NEGATIVE
GLUCOSE UA: NEGATIVE
KETONES UA: NEGATIVE
Leukocytes, UA: NEGATIVE
NITRITE UA: NEGATIVE
PH UA: 5 (ref 5.0–8.0)
Protein, UA: NEGATIVE
RBC UA: NEGATIVE
Urobilinogen, UA: NEGATIVE E.U./dL — AB

## 2017-09-24 NOTE — Progress Notes (Signed)
46 y.o. Single African American female [phone] here with complaint of ? UTI, with onset  on last few days. . Patient complaining of vaginal pain. Some vaginal burning at times, no odor or increase discharge. Patient denies fever, chills, nausea or back pain. Some new detergent and soap, but do not feel this is the problem.. Patient feels may be related  to sexual activity. Denies any vaginal symptoms. Just feels different . Contraception is POP working well. Has not had recent HSV outbreak so does not feel this the problem.  ROS Pertinent to HPI  O: Healthy female WDWN Affect: Normal, orientation x 3 Skin : warm and dry CVAT: negative bilateral Abdomen: negative for suprapubic tenderness  Pelvic exam: External genital area: normal, no lesions Bladder,Urethra non tender, Urethral meatus: non tender,not red Vagina: normal vaginal discharge, normal appearance, but tenderness with vaginal exam  Affirm taken Cervix: normal, non tender, no CMT Uterus:normal,non tender Adnexa: normal non tender, no fullness or masses   A: Normal pelvic exam Contraception POP poct urine-neg R/O vaginal infection  P: Reviewed findings of normal pelvic exam and no symptoms consistent with UTI and urine negative. Feel vaginal origin due to tenderness, but no pain. Will do affirm and treat if indicated. Discussed possible vaginal dryness with sexual activity and encouraged to use OTC lubricant to see if resolves. Questions answered and patient agreeable. YQM:GNOIBB will treat if indicated Warning signs with vaginal pain and need to advise if occurring. Reviewed warning signs and symptoms of UTI and need to advise if occurring. Encouraged to limit soda, tea, and coffee and be sure to increase water intake.   RV prn

## 2017-09-25 LAB — VAGINITIS/VAGINOSIS, DNA PROBE
Candida Species: NEGATIVE
GARDNERELLA VAGINALIS: NEGATIVE
TRICHOMONAS VAG: NEGATIVE

## 2017-09-28 ENCOUNTER — Telehealth: Payer: Self-pay

## 2017-09-28 NOTE — Telephone Encounter (Signed)
-----   Message from Regina Eck, CNM sent at 09/26/2017  8:16 PM EST ----- Notify patient that vaginal screening was negative for yeast , BV and trichomonas. Patient status

## 2017-09-28 NOTE — Telephone Encounter (Signed)
Patient notified of results. See lab 

## 2017-09-28 NOTE — Telephone Encounter (Signed)
Mailbox full. Try again. 

## 2017-11-04 ENCOUNTER — Other Ambulatory Visit: Payer: Self-pay

## 2017-11-04 ENCOUNTER — Encounter: Payer: Self-pay | Admitting: Physician Assistant

## 2017-11-04 ENCOUNTER — Encounter (HOSPITAL_COMMUNITY): Payer: Self-pay | Admitting: Physician Assistant

## 2017-11-04 ENCOUNTER — Ambulatory Visit (INDEPENDENT_AMBULATORY_CARE_PROVIDER_SITE_OTHER): Payer: PRIVATE HEALTH INSURANCE | Admitting: Physician Assistant

## 2017-11-04 ENCOUNTER — Ambulatory Visit (HOSPITAL_COMMUNITY)
Admission: RE | Admit: 2017-11-04 | Discharge: 2017-11-04 | Disposition: A | Discharge status: Home / Self Care | Payer: PRIVATE HEALTH INSURANCE | Source: Ambulatory Visit | Attending: Physician Assistant | Admitting: Physician Assistant

## 2017-11-04 ENCOUNTER — Encounter (HOSPITAL_COMMUNITY): Payer: Self-pay | Admitting: Emergency Medicine

## 2017-11-04 ENCOUNTER — Inpatient Hospital Stay (HOSPITAL_COMMUNITY)
Admission: EM | Admit: 2017-11-04 | Discharge: 2017-11-10 | DRG: 339 | Disposition: A | Discharge status: Home / Self Care | Payer: PRIVATE HEALTH INSURANCE | Attending: Orthopaedic Surgery | Admitting: Orthopaedic Surgery

## 2017-11-04 ENCOUNTER — Telehealth: Payer: Self-pay | Admitting: Family Medicine

## 2017-11-04 VITALS — BP 128/84 | HR 102 | Temp 99.5°F | Resp 16 | Ht 61.75 in | Wt 170.4 lb

## 2017-11-04 DIAGNOSIS — R1084 Generalized abdominal pain: Secondary | ICD-10-CM

## 2017-11-04 DIAGNOSIS — K3580 Unspecified acute appendicitis: Secondary | ICD-10-CM | POA: Insufficient documentation

## 2017-11-04 DIAGNOSIS — K567 Ileus, unspecified: Secondary | ICD-10-CM | POA: Diagnosis not present

## 2017-11-04 DIAGNOSIS — D259 Leiomyoma of uterus, unspecified: Secondary | ICD-10-CM | POA: Diagnosis not present

## 2017-11-04 DIAGNOSIS — R10813 Right lower quadrant abdominal tenderness: Secondary | ICD-10-CM | POA: Diagnosis not present

## 2017-11-04 DIAGNOSIS — Z8249 Family history of ischemic heart disease and other diseases of the circulatory system: Secondary | ICD-10-CM

## 2017-11-04 DIAGNOSIS — B009 Herpesviral infection, unspecified: Secondary | ICD-10-CM | POA: Diagnosis present

## 2017-11-04 DIAGNOSIS — K3533 Acute appendicitis with perforation and localized peritonitis, with abscess: Principal | ICD-10-CM | POA: Diagnosis present

## 2017-11-04 DIAGNOSIS — F329 Major depressive disorder, single episode, unspecified: Secondary | ICD-10-CM | POA: Diagnosis present

## 2017-11-04 DIAGNOSIS — J45909 Unspecified asthma, uncomplicated: Secondary | ICD-10-CM | POA: Diagnosis present

## 2017-11-04 DIAGNOSIS — I10 Essential (primary) hypertension: Secondary | ICD-10-CM | POA: Diagnosis present

## 2017-11-04 DIAGNOSIS — Z79899 Other long term (current) drug therapy: Secondary | ICD-10-CM

## 2017-11-04 LAB — POCT URINALYSIS DIP (MANUAL ENTRY)
BILIRUBIN UA: NEGATIVE
Glucose, UA: NEGATIVE mg/dL
LEUKOCYTES UA: NEGATIVE
NITRITE UA: NEGATIVE
PH UA: 7 (ref 5.0–8.0)
Protein Ur, POC: NEGATIVE mg/dL
Spec Grav, UA: 1.02 (ref 1.010–1.025)
Urobilinogen, UA: 0.2 E.U./dL

## 2017-11-04 LAB — CBC WITH DIFFERENTIAL/PLATELET
Basophils Absolute: 0 10*3/uL (ref 0.0–0.1)
Basophils Relative: 0 %
EOS ABS: 0.1 10*3/uL (ref 0.0–0.7)
Eosinophils Relative: 1 %
HEMATOCRIT: 35.9 % — AB (ref 36.0–46.0)
HEMOGLOBIN: 12.8 g/dL (ref 12.0–15.0)
LYMPHS ABS: 2.6 10*3/uL (ref 0.7–4.0)
LYMPHS PCT: 17 %
MCH: 28 pg (ref 26.0–34.0)
MCHC: 35.7 g/dL (ref 30.0–36.0)
MCV: 78.6 fL (ref 78.0–100.0)
MONOS PCT: 9 %
Monocytes Absolute: 1.5 10*3/uL — ABNORMAL HIGH (ref 0.1–1.0)
NEUTROS PCT: 73 %
Neutro Abs: 11.3 10*3/uL — ABNORMAL HIGH (ref 1.7–7.7)
Platelets: 308 10*3/uL (ref 150–400)
RBC: 4.57 MIL/uL (ref 3.87–5.11)
RDW: 13.9 % (ref 11.5–15.5)
WBC: 15.5 10*3/uL — ABNORMAL HIGH (ref 4.0–10.5)

## 2017-11-04 LAB — POCT CBC
GRANULOCYTE PERCENT: 81.1 % — AB (ref 37–80)
HEMATOCRIT: 39.3 % (ref 37.7–47.9)
HEMOGLOBIN: 12.8 g/dL (ref 12.2–16.2)
Lymph, poc: 2 (ref 0.6–3.4)
MCH, POC: 27.3 pg (ref 27–31.2)
MCHC: 32.5 g/dL (ref 31.8–35.4)
MCV: 84 fL (ref 80–97)
MID (cbc): 0.4 (ref 0–0.9)
MPV: 7.2 fL (ref 0–99.8)
PLATELET COUNT, POC: 356 10*3/uL (ref 142–424)
POC GRANULOCYTE: 10.5 — AB (ref 2–6.9)
POC LYMPH PERCENT: 15.7 %L (ref 10–50)
POC MID %: 3.2 %M (ref 0–12)
RBC: 4.68 M/uL (ref 4.04–5.48)
RDW, POC: 13.6 %
WBC: 13 10*3/uL — AB (ref 4.6–10.2)

## 2017-11-04 LAB — BASIC METABOLIC PANEL
Anion gap: 11 (ref 5–15)
BUN: <5 mg/dL — ABNORMAL LOW (ref 6–20)
CHLORIDE: 102 mmol/L (ref 101–111)
CO2: 23 mmol/L (ref 22–32)
CREATININE: 0.5 mg/dL (ref 0.44–1.00)
Calcium: 8.5 mg/dL — ABNORMAL LOW (ref 8.9–10.3)
GFR calc non Af Amer: >60 mL/min (ref >60)
Glucose, Bld: 89 mg/dL (ref 65–99)
POTASSIUM: 3.6 mmol/L (ref 3.5–5.1)
Sodium: 136 mmol/L (ref 135–145)

## 2017-11-04 LAB — POCT WET + KOH PREP
TRICH BY WET PREP: ABSENT
YEAST BY KOH: ABSENT
YEAST BY WET PREP: ABSENT

## 2017-11-04 LAB — POCT URINE PREGNANCY: Preg Test, Ur: NEGATIVE

## 2017-11-04 LAB — POC HEMOCCULT BLD/STL (OFFICE/1-CARD/DIAGNOSTIC): FECAL OCCULT BLD: NEGATIVE

## 2017-11-04 LAB — PROTIME-INR
INR: 1.12
PROTHROMBIN TIME: 14.3 s (ref 11.4–15.2)

## 2017-11-04 MED ORDER — HYDROMORPHONE HCL 1 MG/ML IJ SOLN
1.0000 mg | Freq: Once | INTRAMUSCULAR | Status: AC
  Administered 2017-11-04: 1 mg via INTRAVENOUS
  Filled 2017-11-04: qty 1

## 2017-11-04 MED ORDER — METRONIDAZOLE IN NACL 5-0.79 MG/ML-% IV SOLN
500.0000 mg | Freq: Once | INTRAVENOUS | Status: AC
  Administered 2017-11-05: 500 mg via INTRAVENOUS
  Filled 2017-11-04: qty 100

## 2017-11-04 MED ORDER — LACTATED RINGERS IV BOLUS (SEPSIS)
1000.0000 mL | Freq: Once | INTRAVENOUS | Status: AC
  Administered 2017-11-05: 1000 mL via INTRAVENOUS

## 2017-11-04 MED ORDER — MORPHINE SULFATE (PF) 4 MG/ML IV SOLN
4.0000 mg | INTRAVENOUS | Status: DC | PRN
  Administered 2017-11-05: 4 mg via INTRAVENOUS
  Filled 2017-11-04: qty 1

## 2017-11-04 MED ORDER — ALBUTEROL SULFATE (2.5 MG/3ML) 0.083% IN NEBU: 3.0000 mL | INHALATION_SOLUTION | Freq: Four times a day (QID) | RESPIRATORY_TRACT | Status: DC | PRN

## 2017-11-04 MED ORDER — DEXTROSE 5 % IV SOLN
2.0000 g | Freq: Once | INTRAVENOUS | Status: AC
  Administered 2017-11-04: 2 g via INTRAVENOUS
  Filled 2017-11-04: qty 2

## 2017-11-04 MED ORDER — IOPAMIDOL (ISOVUE-300) INJECTION 61%
INTRAVENOUS | Status: AC
  Administered 2017-11-04: 100 mL
  Filled 2017-11-04: qty 100

## 2017-11-04 MED ORDER — ONDANSETRON HCL 4 MG/2ML IJ SOLN: 4.0000 mg | Freq: Four times a day (QID) | INTRAMUSCULAR | Status: DC | PRN

## 2017-11-04 NOTE — Telephone Encounter (Signed)
Unable to reach patient by phone to notify her that she has appendicitis Review of her chart shows that she has arrived in the ER  Spoke with Beth Frost and pt is in the ER with plan for surgery tonight or in the morning.

## 2017-11-04 NOTE — ED Triage Notes (Signed)
Pt presents to ED after being seen by her PCP and given a CT scan that showed appendicitis.  Pain since Sunday.  Denies fevers or chills.

## 2017-11-04 NOTE — ED Provider Notes (Signed)
[facility] EMERGENCY DEPARTMENT Provider Note   CSN: [id number] Arrival date & time: 11/04/17  2049     History   Chief Complaint Chief Complaint  Patient presents with  . Abdominal Pain    HPI Beth Frost is a 46 y.o. female.  HPI 46 year old female comes in with chief complaint of abdominal pain.  Patient reports that her abdominal pain started about 4 days ago.  Abdominal pain was initially intermittent, however over time it has become constant and more severe.  Patient's pain is primarily located in the lower right quadrant.  Patient has associated nausea without emesis, anorexia.  Patient denies any UTI-like symptoms, vaginal discharge or bleeding.  Pt saw her pcp earlier today and had a CT scan done as an outpatient that showed appendicitis.  Past Medical History:  Diagnosis Date  . Abnormal Pap smear    many yrs ago  . Asthma   . Depression   . Hypertension   . STD (sexually transmitted disease)    HSV2    Patient Active Problem List   Diagnosis Date Noted  . Acute appendicitis 11/04/2017  . Essential hypertension 11/26/2016    Class: Family History of  . Herpes genitalis in women 07/18/2014    Class: History of    Past Surgical History:  Procedure Laterality Date  . CHOLECYSTECTOMY N/A 05/11/2014   Procedure: LAPAROSCOPIC CHOLECYSTECTOMY WITH INTRAOPERATIVE CHOLANGIOGRAM;  Surgeon: Zenovia Jarred, MD;  Location: [place];  Service: General;  Laterality: N/A;  . CHOLECYSTECTOMY  04/2014  . COLPOSCOPY    . CRYOTHERAPY     for abnormal pap  . DILATION AND CURETTAGE OF UTERUS      OB History    Gravida Para Term Preterm AB Living   4 2 2   2 2    SAB TAB Ectopic Multiple Live Births   1 1     2        Home Medications    Prior to Admission medications   Medication Sig Start Date End Date Taking? Authorizing Provider  acetaminophen (TYLENOL) 325 MG tablet Take 325 mg by mouth every 6 (six) hours as needed (pain).    Yes  [provider]  albuterol (PROVENTIL HFA;VENTOLIN HFA) 108 (90 Base) MCG/ACT inhaler Inhale 2 puffs into the lungs every 6 (six) hours as needed for wheezing or shortness of breath. 11/27/16  Yes Stallings, Zoe A, MD  amLODipine (NORVASC) 5 MG tablet TAKE 1 TABLET(5 MG) BY MOUTH DAILY 05/17/17  Yes Stallings, Zoe A, MD  ibuprofen (ADVIL,MOTRIN) 200 MG tablet Take 200 mg by mouth as needed.   Yes [provider]  Multiple Vitamins-Minerals (MULTIVITAMIN PO) Take 1 tablet by mouth daily as needed (supplemental).    Yes [provider]  norethindrone (MICRONOR,CAMILA,ERRIN) 0.35 MG tablet Take 1 tablet (0.35 mg total) by mouth daily. 11/26/16  Yes Regina Eck, CNM  valACYclovir (VALTREX) 1000 MG tablet Take 1 tablet (1,000 mg total) by mouth daily. 11/26/16  Yes Regina Eck, CNM    Family History Family History  Problem Relation Age of Onset  . Diabetes Mother   . Cancer Mother        leukemia  . Lupus Mother   . Hypertension Mother   . Hypertension Father   . Breast cancer Maternal Aunt     Social History Social History   Tobacco Use  . Smoking status: Never Smoker  . Smokeless tobacco: Never Used  Substance Use Topics  . Alcohol  use: Yes    Comment: 3-4 times a week (wine)  . Drug use: No     Allergies   Patient has no known allergies.   Review of Systems Review of Systems  Constitutional: Positive for activity change.  Gastrointestinal: Positive for abdominal pain.  Genitourinary: Negative for dysuria and vaginal bleeding.  Allergic/Immunologic: Negative for immunocompromised state.  Hematological: Does not bruise/bleed easily.     Physical Exam Updated Vital Signs BP (!) 94/55   Pulse 96   Temp 99.6 F (37.6 C) (Oral)   Resp 18   LMP 10/21/2017   SpO2 95%   Physical Exam  Constitutional: She is oriented to person, place, and time. She appears well-developed.  HENT:  Head: Normocephalic and atraumatic.  Eyes: EOM are  normal.  Neck: Normal range of motion. Neck supple.  Cardiovascular: Normal rate.  Pulmonary/Chest: Effort normal.  Abdominal: Bowel sounds are normal. There is tenderness in the right lower quadrant. There is guarding.  Neurological: She is alert and oriented to person, place, and time.  Skin: Skin is warm and dry.  Nursing note and vitals reviewed.    ED Treatments / Results  Labs (all labs ordered are listed, but only abnormal results are displayed) Labs Reviewed  CBC WITH DIFFERENTIAL/PLATELET - Abnormal; Notable for the following components:      Result Value   WBC 15.5 (*)    HCT 35.9 (*)    Neutro Abs 11.3 (*)    Monocytes Absolute 1.5 (*)    All other components within normal limits  PROTIME-INR  BASIC METABOLIC PANEL    EKG  EKG Interpretation None       Radiology Ct Abdomen Pelvis W Contrast  Result Date: 11/04/2017 CLINICAL DATA:  Right lower quadrant pain and fever. Prior cholecystectomy. EXAM: CT ABDOMEN AND PELVIS WITH CONTRAST TECHNIQUE: Multidetector CT imaging of the abdomen and pelvis was performed using the standard protocol following bolus administration of intravenous contrast. CONTRAST:  137mL ISOVUE-300 IOPAMIDOL (ISOVUE-300) INJECTION 61% COMPARISON:  None. FINDINGS: Lower Chest: No acute findings. Hepatobiliary: No hepatic masses identified. Prior cholecystectomy. No evidence of biliary obstruction. Pancreas:  No mass or inflammatory changes. Spleen: Within normal limits in size and appearance. Adrenals/Urinary Tract: No masses identified. No evidence of hydronephrosis. Stomach/Bowel: Findings consistent with acute appendicitis including: Appendix: Location: Standard Diameter: 15 mm, with moderate to severe periappendiceal inflammatory changes Appendicolith: Absent Mucosal hyper-enhancement: Present Extraluminal Gas: Absent Periappendiceal Collection: None Other: Wall thickening of distal ileum secondary to acute appendicitis. Vascular/Lymphatic: No  pathologically enlarged lymph nodes. No abdominal aortic aneurysm. Reproductive: Several small uterine fibroids. Small amount of free fluid. Other:  None. Musculoskeletal:  No suspicious bone lesions identified. IMPRESSION: Positive for acute appendicitis, as described above. Small amount of free fluid in pelvis. No evidence of abscess or bowel obstruction. Several small uterine fibroids incidentally noted. These results will be called to the ordering clinician or representative by the Radiologist Assistant, and communication documented in the PACS or zVision Dashboard. Electronically Signed   By: Earle Gell M.D.   On: 11/04/2017 20:30    Procedures Procedures (including critical care time)  Medications Ordered in ED Medications  cefTRIAXone (ROCEPHIN) 2 g in dextrose 5 % 50 mL IVPB (2 g Intravenous New Bag/Given 11/04/17 2259)    And  metroNIDAZOLE (FLAGYL) IVPB 500 mg (not administered)  morphine 4 MG/ML injection 4 mg (not administered)  ondansetron (ZOFRAN) injection 4 mg (not administered)  lactated ringers bolus 1,000 mL (not administered)  albuterol (PROVENTIL) (  2.5 MG/3ML) 0.083% nebulizer solution 3 mL (not administered)  HYDROmorphone (DILAUDID) injection 1 mg (1 mg Intravenous Given 11/04/17 2257)     Initial Impression / Assessment and Plan / ED Course  I have reviewed the triage vital signs and the nursing notes.  Pertinent labs & imaging results that were available during my care of the patient were reviewed by me and considered in my medical decision making (see chart for details).     Patient comes in with chief complaint of abdominal pain.  Abdominal tenderness mostly located in the right quadrants, CT scan confirms acute appendicitis.  General surgery has been consulted.  Patient has been started on antibiotics. Results from the ER workup discussed with the patient face to face and all questions answered to the best of my ability.   Final Clinical Impressions(s) / ED  Diagnoses   Final diagnoses:  Acute obstructive appendicitis    ED Discharge Orders    None       Varney Biles, MD 11/04/17 2344

## 2017-11-04 NOTE — ED Notes (Signed)
Surgeon at bedside for evaluation.  Plan for surgery tomorrow morning.

## 2017-11-04 NOTE — H&P (Signed)
Beth Frost is an 46 y.o. female.   Chief Complaint: abdominal pain HPI: pt presents to ED with 4 day hx of diffuse abdominal pain that now localizes to the RLQ of her abdomen.  It is constant.  It is worse with movement and palpation.  CT shows acute appendicitis.  Asked to see by EDP. No appetite.  Has had a cholecystectomy in the past  Past Medical History:  Diagnosis Date  . Abnormal Pap smear    many yrs ago  . Asthma   . Depression   . Hypertension   . STD (sexually transmitted disease)    HSV2    Past Surgical History:  Procedure Laterality Date  . CHOLECYSTECTOMY N/A 05/11/2014   Procedure: LAPAROSCOPIC CHOLECYSTECTOMY WITH INTRAOPERATIVE CHOLANGIOGRAM;  Surgeon: Zenovia Jarred, MD;  Location: [place];  Service: General;  Laterality: N/A;  . CHOLECYSTECTOMY  04/2014  . COLPOSCOPY    . CRYOTHERAPY     for abnormal pap  . DILATION AND CURETTAGE OF UTERUS      Family History  Problem Relation Age of Onset  . Diabetes Mother   . Cancer Mother        leukemia  . Lupus Mother   . Hypertension Mother   . Hypertension Father   . Breast cancer Maternal Aunt    Social History:  reports that  has never smoked. she has never used smokeless tobacco. She reports that she drinks alcohol. She reports that she does not use drugs.  Allergies: No Known Allergies   (Not in a hospital admission)  Results for orders placed or performed in visit on 11/04/17 (from the past 48 hour(s))  POCT urinalysis dipstick     Status: Abnormal   Collection Time: 11/04/17  2:26 PM  Result Value Ref Range   Color, UA yellow yellow   Clarity, UA clear clear   Glucose, UA negative negative mg/dL   Bilirubin, UA negative negative   Ketones, POC UA small (15) (A) negative mg/dL   Spec Grav, UA 1.020 1.010 - 1.025   Blood, UA small (A) negative   pH, UA 7.0 5.0 - 8.0   Protein Ur, POC negative negative mg/dL   Urobilinogen, UA 0.2 0.2 or 1.0 E.U./dL   Nitrite, UA Negative Negative    Leukocytes, UA Negative Negative  POCT urine pregnancy     Status: None   Collection Time: 11/04/17  2:26 PM  Result Value Ref Range   Preg Test, Ur Negative Negative  POCT CBC     Status: Abnormal   Collection Time: 11/04/17  2:27 PM  Result Value Ref Range   WBC 13.0 (A) 4.6 - 10.2 K/uL   Lymph, poc 2.0 0.6 - 3.4   POC LYMPH PERCENT 15.7 10 - 50 %L   MID (cbc) 0.4 0 - 0.9   POC MID % 3.2 0 - 12 %M   POC Granulocyte 10.5 (A) 2 - 6.9   Granulocyte percent 81.1 (A) 37 - 80 %G   RBC 4.68 4.04 - 5.48 M/uL   Hemoglobin 12.8 12.2 - 16.2 g/dL   HCT, POC 39.3 37.7 - 47.9 %   MCV 84.0 80 - 97 fL   MCH, POC 27.3 27 - 31.2 pg   MCHC 32.5 31.8 - 35.4 g/dL   RDW, POC 13.6 %   Platelet Count, POC 356 142 - 424 K/uL   MPV 7.2 0 - 99.8 fL  POCT Wet + KOH Prep     Status: Abnormal  Collection Time: 11/04/17  3:16 PM  Result Value Ref Range   Yeast by KOH Absent Absent   Yeast by wet prep Absent Absent   WBC by wet prep Few Few   Clue Cells Wet Prep HPF POC Few (A) None   Trich by wet prep Absent Absent   Bacteria Wet Prep HPF POC Many (A) Few   Epithelial Cells By [employer] Pref ([employer]) Moderate (A) None, Few, Too numerous to count   RBC,UR,HPF,POC None None RBC/hpf  POC Hemoccult Bld/Stl (1-Cd Office Dx)     Status: None   Collection Time: 11/04/17  3:21 PM  Result Value Ref Range   Card #1 Date 11-04-17    Fecal Occult Blood, POC Negative Negative   Ct Abdomen Pelvis W Contrast  Result Date: 11/04/2017 CLINICAL DATA:  Right lower quadrant pain and fever. Prior cholecystectomy. EXAM: CT ABDOMEN AND PELVIS WITH CONTRAST TECHNIQUE: Multidetector CT imaging of the abdomen and pelvis was performed using the standard protocol following bolus administration of intravenous contrast. CONTRAST:  139mL ISOVUE-300 IOPAMIDOL (ISOVUE-300) INJECTION 61% COMPARISON:  None. FINDINGS: Lower Chest: No acute findings. Hepatobiliary: No hepatic masses identified. Prior cholecystectomy. No evidence of biliary  obstruction. Pancreas:  No mass or inflammatory changes. Spleen: Within normal limits in size and appearance. Adrenals/Urinary Tract: No masses identified. No evidence of hydronephrosis. Stomach/Bowel: Findings consistent with acute appendicitis including: Appendix: Location: Standard Diameter: 15 mm, with moderate to severe periappendiceal inflammatory changes Appendicolith: Absent Mucosal hyper-enhancement: Present Extraluminal Gas: Absent Periappendiceal Collection: None Other: Wall thickening of distal ileum secondary to acute appendicitis. Vascular/Lymphatic: No pathologically enlarged lymph nodes. No abdominal aortic aneurysm. Reproductive: Several small uterine fibroids. Small amount of free fluid. Other:  None. Musculoskeletal:  No suspicious bone lesions identified. IMPRESSION: Positive for acute appendicitis, as described above. Small amount of free fluid in pelvis. No evidence of abscess or bowel obstruction. Several small uterine fibroids incidentally noted. These results will be called to the ordering clinician or representative by the Radiologist Assistant, and communication documented in the PACS or zVision Dashboard. Electronically Signed   By: Earle Gell M.D.   On: 11/04/2017 20:30    Review of Systems  Constitutional: Positive for malaise/fatigue. Negative for chills and fever.  HENT: Negative for hearing loss.   Eyes: Negative for blurred vision.  Respiratory: Negative for cough and hemoptysis.   Cardiovascular: Negative for chest pain and palpitations.  Gastrointestinal: Positive for abdominal pain and nausea. Negative for heartburn.  Genitourinary: Negative for dysuria and urgency.  Musculoskeletal: Negative for myalgias and neck pain.  Skin: Negative for rash.  Neurological: Negative for dizziness and headaches.  Endo/Heme/Allergies: Negative for environmental allergies. Does not bruise/bleed easily.  Psychiatric/Behavioral: Negative for depression and suicidal ideas.     Blood pressure 120/67, pulse 94, temperature 99.6 F (37.6 C), temperature source Oral, resp. rate 16, last menstrual period 10/21/2017, SpO2 100 %. Physical Exam  Constitutional: She is oriented to person, place, and time. She appears well-developed and well-nourished.  HENT:  Head: Normocephalic and atraumatic.  Eyes: Pupils are equal, round, and reactive to light.  Neck: Normal range of motion. Neck supple.  Cardiovascular: Normal rate and regular rhythm.  Respiratory: Effort normal and breath sounds normal.  GI: There is tenderness. There is tenderness at McBurney's point.  Musculoskeletal: Normal range of motion.  Neurological: She is alert and oriented to person, place, and time.  Skin: Skin is warm.  Psychiatric: She has a normal mood and affect. Her behavior is normal.  Assessment/Plan Acute appendicitis  Admit  IVF and ABX Appendectomy in am per Dr Ninfa Linden.   Najarro Daniels, MD 11/04/2017, 10:58 PM

## 2017-11-04 NOTE — Patient Instructions (Signed)
     IF you received an x-ray today, you will receive an invoice from [facility]. Please contact [facility] at [phone] with questions or concerns regarding your invoice.   IF you received labwork today, you will receive an invoice from [facility]. Please contact [facility] at [phone] with questions or concerns regarding your invoice.   Our billing staff will not be able to assist you with questions regarding bills from these companies.  You will be contacted with the lab results as soon as they are available. The fastest way to get your results is to activate your My Chart account. Instructions are located on the last page of this paperwork. If you have not heard from us regarding the results in 2 weeks, please contact this office.     

## 2017-11-04 NOTE — Progress Notes (Signed)
11/06/2017 9:43 AM   DOB: 06-12-1971 / MRN: [id number]  SUBJECTIVE:  Beth Frost is a 46 y.o. female presenting for lower abdominal pain that started 4 days ago. No fever at home.  Has been trying to push fluids. History of gall bladder surgery. Feels that she has mostly lost her appetite. She feels that she is getting worse.  Moving bowels normally.  No nausea, emesis.  No meds at home.   She has No Known Allergies.   She  has a past medical history of Abnormal Pap smear, Asthma, Depression, Hypertension, and STD (sexually transmitted disease).    She  reports that  has never smoked. she has never used smokeless tobacco. She reports that she drinks alcohol. She reports that she does not use drugs. She  reports that she currently engages in sexual activity and has had partners who are Female. She reports using the following method of birth control/protection: Pill. The patient  has a past surgical history that includes Colposcopy; Cryotherapy; Dilation and curettage of uterus; Cholecystectomy (N/A, 05/11/2014); Cholecystectomy (04/2014); and laparoscopic appendectomy (N/A, 11/05/2017).  Her family history includes Breast cancer in her maternal aunt; Cancer in her mother; Diabetes in her mother; Hypertension in her father and mother; Lupus in her mother.  Review of Systems  Constitutional: Negative for chills, diaphoresis and fever.  Respiratory: Negative for cough, hemoptysis, sputum production, shortness of breath and wheezing.   Cardiovascular: Negative for chest pain, orthopnea and leg swelling.  Gastrointestinal: Negative for abdominal pain, blood in stool, constipation, diarrhea, heartburn, melena, nausea and vomiting.  Genitourinary: Negative for dysuria, flank pain, frequency, hematuria and urgency.  Skin: Negative for rash.  Neurological: Negative for dizziness.    The problem list and medications were reviewed and updated by myself where necessary and exist elsewhere in the  encounter.   OBJECTIVE:  BP 128/84 (BP Location: Right Arm, Patient Position: Sitting, Cuff Size: Normal)   Pulse (!) 102   Temp 99.5 F (37.5 C) (Oral)   Resp 16   Ht 5' 1.75" (1.568 m)   Wt 170 lb 6.4 oz (77.3 kg)   LMP 10/21/2017   SpO2 99%   BMI 31.42 kg/m   Wt Readings from Last 3 Encounters:  11/05/17 170 lb 6.4 oz (77.3 kg)  11/04/17 170 lb 6.4 oz (77.3 kg)  09/24/17 169 lb (76.7 kg)   Temp Readings from Last 3 Encounters:  11/06/17 98.4 F (36.9 C) (Oral)  11/04/17 99.5 F (37.5 C) (Oral)  09/24/17 98.3 F (36.8 C) (Oral)   BP Readings from Last 3 Encounters:  11/06/17 112/65  11/04/17 128/84  09/24/17 118/80   Pulse Readings from Last 3 Encounters:  11/06/17 94  11/04/17 (!) 102  09/24/17 68     Physical Exam  Constitutional: She is oriented to person, place, and time.  Cardiovascular: Regular rhythm, normal heart sounds and intact distal pulses. Exam reveals no gallop and no friction rub.  No murmur heard. Pulmonary/Chest: Effort normal and breath sounds normal.  Abdominal: There is tenderness (right and left (R>L) with mild rebound and no guarding. ).  Genitourinary: Rectum normal and uterus normal. Cervix exhibits discharge (clear). Cervix exhibits no motion tenderness and no friability. Right adnexum displays no mass, no tenderness and no fullness. Left adnexum displays no mass, no tenderness and no fullness.  Neurological: She is alert and oriented to person, place, and time.  Nursing note and vitals reviewed.   Results for orders placed or performed in visit on  11/04/17 (from the past 72 hour(s))  POCT urinalysis dipstick     Status: Abnormal   Collection Time: 11/04/17  2:26 PM  Result Value Ref Range   Color, UA yellow yellow   Clarity, UA clear clear   Glucose, UA negative negative mg/dL   Bilirubin, UA negative negative   Ketones, POC UA small (15) (A) negative mg/dL   Spec Grav, UA 1.020 1.010 - 1.025   Blood, UA small (A) negative    pH, UA 7.0 5.0 - 8.0   Protein Ur, POC negative negative mg/dL   Urobilinogen, UA 0.2 0.2 or 1.0 E.U./dL   Nitrite, UA Negative Negative   Leukocytes, UA Negative Negative  POCT urine pregnancy     Status: None   Collection Time: 11/04/17  2:26 PM  Result Value Ref Range   Preg Test, Ur Negative Negative  POCT CBC     Status: Abnormal   Collection Time: 11/04/17  2:27 PM  Result Value Ref Range   WBC 13.0 (A) 4.6 - 10.2 K/uL   Lymph, poc 2.0 0.6 - 3.4   POC LYMPH PERCENT 15.7 10 - 50 %L   MID (cbc) 0.4 0 - 0.9   POC MID % 3.2 0 - 12 %M   POC Granulocyte 10.5 (A) 2 - 6.9   Granulocyte percent 81.1 (A) 37 - 80 %G   RBC 4.68 4.04 - 5.48 M/uL   Hemoglobin 12.8 12.2 - 16.2 g/dL   HCT, POC 39.3 37.7 - 47.9 %   MCV 84.0 80 - 97 fL   MCH, POC 27.3 27 - 31.2 pg   MCHC 32.5 31.8 - 35.4 g/dL   RDW, POC 13.6 %   Platelet Count, POC 356 142 - 424 K/uL   MPV 7.2 0 - 99.8 fL  POCT Wet + KOH Prep     Status: Abnormal   Collection Time: 11/04/17  3:16 PM  Result Value Ref Range   Yeast by KOH Absent Absent   Yeast by wet prep Absent Absent   WBC by wet prep Few Few   Clue Cells Wet Prep HPF POC Few (A) None   Trich by wet prep Absent Absent   Bacteria Wet Prep HPF POC Many (A) Few   Epithelial Cells By [employer] Pref ([employer]) Moderate (A) None, Few, Too numerous to count   RBC,UR,HPF,POC None None RBC/hpf  POC Hemoccult Bld/Stl (1-Cd Office Dx)     Status: None   Collection Time: 11/04/17  3:21 PM  Result Value Ref Range   Card #1 Date 11-04-17    Fecal Occult Blood, POC Negative Negative  CMP and Liver     Status: Abnormal   Collection Time: 11/04/17  3:44 PM  Result Value Ref Range   Glucose 89 65 - 99 mg/dL   BUN 4 (L) 6 - 24 mg/dL   Creatinine, Ser 0.47 (L) 0.57 - 1.00 mg/dL   GFR calc non Af Amer 121 >59 mL/min/1.73   GFR calc Af Amer 139 >59 mL/min/1.73   Sodium 140 134 - 144 mmol/L   Potassium 4.4 3.5 - 5.2 mmol/L   Chloride 102 96 - 106 mmol/L   CO2 25 20 - 29 mmol/L   Calcium  9.0 8.7 - 10.2 mg/dL   Total Protein 6.6 6.0 - 8.5 g/dL   Albumin 3.9 3.5 - 5.5 g/dL   Bilirubin Total 0.8 0.0 - 1.2 mg/dL   Bilirubin, Direct 0.20 0.00 - 0.40 mg/dL   Alkaline Phosphatase 81 39 -  117 IU/L   AST 18 0 - 40 IU/L   ALT 14 0 - 32 IU/L  HIV antibody     Status: None   Collection Time: 11/04/17  3:44 PM  Result Value Ref Range   HIV Screen 4th Generation wRfx Non Reactive Non Reactive    No results found.  ASSESSMENT AND PLAN:  Yi was seen today for abdominal pain.  Diagnoses and all orders for this visit:  Right lower quadrant abdominal tenderness, rebound tenderness presence not specified: I am concerned for appendicitis. Will CT abdomen and if positive she will present to the ED for further management and surgical consult.  -     POCT CBC -     POCT urinalysis dipstick -     POCT urine pregnancy -     CMP and Liver -     Cancel: Hemoccult - 1 Card (office) -     GC/Chlamydia Probe Amp -     POCT Wet + KOH Prep -     HIV antibody (with reflex) -     POC Hemoccult Bld/Stl (1-Cd Office Dx)  Generalized abdominal pain -     CT Abdomen Pelvis W Contrast; Future -     POC Hemoccult Bld/Stl (1-Cd Office Dx)  Other orders -     HIV antibody    The patient is advised to call or return to clinic if she does not see an improvement in symptoms, or to seek the care of the closest emergency department if she worsens with the above plan.   Philis Fendt, MHS, PA-C [facility] 11/06/2017 9:43 AM

## 2017-11-05 ENCOUNTER — Inpatient Hospital Stay (HOSPITAL_COMMUNITY): Payer: PRIVATE HEALTH INSURANCE | Admitting: Certified Registered Nurse Anesthetist

## 2017-11-05 ENCOUNTER — Other Ambulatory Visit: Payer: Self-pay

## 2017-11-05 ENCOUNTER — Encounter (HOSPITAL_COMMUNITY): Admission: EM | Disposition: A | Discharge status: Home / Self Care | Payer: Self-pay | Source: Home / Self Care

## 2017-11-05 ENCOUNTER — Encounter (HOSPITAL_COMMUNITY): Payer: Self-pay | Admitting: Certified Registered Nurse Anesthetist

## 2017-11-05 DIAGNOSIS — K567 Ileus, unspecified: Secondary | ICD-10-CM | POA: Diagnosis not present

## 2017-11-05 DIAGNOSIS — K3580 Unspecified acute appendicitis: Secondary | ICD-10-CM | POA: Diagnosis present

## 2017-11-05 DIAGNOSIS — F329 Major depressive disorder, single episode, unspecified: Secondary | ICD-10-CM | POA: Diagnosis present

## 2017-11-05 DIAGNOSIS — B009 Herpesviral infection, unspecified: Secondary | ICD-10-CM | POA: Diagnosis present

## 2017-11-05 DIAGNOSIS — Z79899 Other long term (current) drug therapy: Secondary | ICD-10-CM | POA: Diagnosis not present

## 2017-11-05 DIAGNOSIS — I10 Essential (primary) hypertension: Secondary | ICD-10-CM | POA: Diagnosis present

## 2017-11-05 DIAGNOSIS — J45909 Unspecified asthma, uncomplicated: Secondary | ICD-10-CM | POA: Diagnosis present

## 2017-11-05 DIAGNOSIS — Z8249 Family history of ischemic heart disease and other diseases of the circulatory system: Secondary | ICD-10-CM | POA: Diagnosis not present

## 2017-11-05 DIAGNOSIS — K3533 Acute appendicitis with perforation and localized peritonitis, with abscess: Secondary | ICD-10-CM | POA: Diagnosis present

## 2017-11-05 HISTORY — PX: LAPAROSCOPIC APPENDECTOMY: SHX408

## 2017-11-05 LAB — CBC
HEMATOCRIT: 31.8 % — AB (ref 36.0–46.0)
HEMOGLOBIN: 11.3 g/dL — AB (ref 12.0–15.0)
MCH: 27.8 pg (ref 26.0–34.0)
MCHC: 35.5 g/dL (ref 30.0–36.0)
MCV: 78.3 fL (ref 78.0–100.0)
PLATELETS: 318 10*3/uL (ref 150–400)
RBC: 4.06 MIL/uL (ref 3.87–5.11)
RDW: 13.9 % (ref 11.5–15.5)
WBC: 13.6 10*3/uL — ABNORMAL HIGH (ref 4.0–10.5)

## 2017-11-05 LAB — COMPREHENSIVE METABOLIC PANEL
ALK PHOS: 66 U/L (ref 38–126)
ALT: 15 U/L (ref 14–54)
AST: 17 U/L (ref 15–41)
Albumin: 2.8 g/dL — ABNORMAL LOW (ref 3.5–5.0)
Anion gap: 7 (ref 5–15)
BILIRUBIN TOTAL: 0.9 mg/dL (ref 0.3–1.2)
CALCIUM: 8.1 mg/dL — AB (ref 8.9–10.3)
CHLORIDE: 104 mmol/L (ref 101–111)
CO2: 25 mmol/L (ref 22–32)
CREATININE: 0.48 mg/dL (ref 0.44–1.00)
GFR calc Af Amer: >60 mL/min (ref >60)
Glucose, Bld: 130 mg/dL — ABNORMAL HIGH (ref 65–99)
Potassium: 3.5 mmol/L (ref 3.5–5.1)
Sodium: 136 mmol/L (ref 135–145)
Total Protein: 6.3 g/dL — ABNORMAL LOW (ref 6.5–8.1)

## 2017-11-05 LAB — CMP AND LIVER
ALT: 14 IU/L (ref 0–32)
AST: 18 IU/L (ref 0–40)
Albumin: 3.9 g/dL (ref 3.5–5.5)
Alkaline Phosphatase: 81 IU/L (ref 39–117)
BUN: 4 mg/dL — AB (ref 6–24)
Bilirubin Total: 0.8 mg/dL (ref 0.0–1.2)
Bilirubin, Direct: 0.2 mg/dL (ref 0.00–0.40)
CALCIUM: 9 mg/dL (ref 8.7–10.2)
CO2: 25 mmol/L (ref 20–29)
CREATININE: 0.47 mg/dL — AB (ref 0.57–1.00)
Chloride: 102 mmol/L (ref 96–106)
GFR calc non Af Amer: 121 mL/min/{1.73_m2} (ref >59)
GFR, EST AFRICAN AMERICAN: 139 mL/min/{1.73_m2} (ref >59)
GLUCOSE: 89 mg/dL (ref 65–99)
Potassium: 4.4 mmol/L (ref 3.5–5.2)
SODIUM: 140 mmol/L (ref 134–144)
TOTAL PROTEIN: 6.6 g/dL (ref 6.0–8.5)

## 2017-11-05 LAB — SURGICAL PCR SCREEN
MRSA, PCR: NEGATIVE
Staphylococcus aureus: POSITIVE — AB

## 2017-11-05 LAB — HIV ANTIBODY (ROUTINE TESTING W REFLEX): HIV Screen 4th Generation wRfx: NONREACTIVE

## 2017-11-05 SURGERY — APPENDECTOMY, LAPAROSCOPIC
Anesthesia: General | Site: Abdomen

## 2017-11-05 MED ORDER — TRAMADOL HCL 50 MG PO TABS
50.0000 mg | ORAL_TABLET | Freq: Four times a day (QID) | ORAL | Status: DC | PRN
  Administered 2017-11-06 – 2017-11-09 (×10): 50 mg via ORAL
  Filled 2017-11-05 (×11): qty 1

## 2017-11-05 MED ORDER — ONDANSETRON HCL 4 MG/2ML IJ SOLN: 4.0000 mg | Freq: Four times a day (QID) | INTRAMUSCULAR | Status: DC | PRN

## 2017-11-05 MED ORDER — PROPOFOL 10 MG/ML IV BOLUS
INTRAVENOUS | Status: AC
  Filled 2017-11-05: qty 20

## 2017-11-05 MED ORDER — MUPIROCIN 2 % EX OINT
1.0000 "application " | TOPICAL_OINTMENT | Freq: Two times a day (BID) | CUTANEOUS | Status: AC
  Administered 2017-11-05 – 2017-11-09 (×9): 1 via NASAL
  Filled 2017-11-05 (×3): qty 22

## 2017-11-05 MED ORDER — DEXTROSE 5 % IV SOLN: 2.0000 g | INTRAVENOUS | Status: DC

## 2017-11-05 MED ORDER — ROCURONIUM BROMIDE 10 MG/ML (PF) SYRINGE
PREFILLED_SYRINGE | INTRAVENOUS | Status: AC
  Filled 2017-11-05: qty 10

## 2017-11-05 MED ORDER — PHENYLEPHRINE 40 MCG/ML (10ML) SYRINGE FOR IV PUSH (FOR BLOOD PRESSURE SUPPORT)
PREFILLED_SYRINGE | INTRAVENOUS | Status: AC
  Filled 2017-11-05: qty 20

## 2017-11-05 MED ORDER — ONDANSETRON 4 MG PO TBDP: 4.0000 mg | ORAL_TABLET | Freq: Four times a day (QID) | ORAL | Status: DC | PRN

## 2017-11-05 MED ORDER — HYDRALAZINE HCL 20 MG/ML IJ SOLN: 10.0000 mg | INTRAMUSCULAR | Status: DC | PRN

## 2017-11-05 MED ORDER — EPHEDRINE 5 MG/ML INJ
INTRAVENOUS | Status: AC
  Filled 2017-11-05: qty 10

## 2017-11-05 MED ORDER — SODIUM CHLORIDE 0.9 % IR SOLN
Status: DC | PRN
  Administered 2017-11-05: 1000 mL

## 2017-11-05 MED ORDER — MIDAZOLAM HCL 2 MG/2ML IJ SOLN
INTRAMUSCULAR | Status: AC
  Filled 2017-11-05: qty 2

## 2017-11-05 MED ORDER — FENTANYL CITRATE (PF) 250 MCG/5ML IJ SOLN
INTRAMUSCULAR | Status: AC
  Filled 2017-11-05: qty 5

## 2017-11-05 MED ORDER — LACTATED RINGERS IV SOLN
INTRAVENOUS | Status: DC
  Administered 2017-11-05: 09:00:00 via INTRAVENOUS

## 2017-11-05 MED ORDER — ENOXAPARIN SODIUM 40 MG/0.4ML ~~LOC~~ SOLN
40.0000 mg | SUBCUTANEOUS | Status: DC
  Administered 2017-11-06 – 2017-11-10 (×5): 40 mg via SUBCUTANEOUS
  Filled 2017-11-05 (×5): qty 0.4

## 2017-11-05 MED ORDER — ROCURONIUM BROMIDE 100 MG/10ML IV SOLN
INTRAVENOUS | Status: DC | PRN
  Administered 2017-11-05 (×2): 5 mg via INTRAVENOUS
  Administered 2017-11-05: 50 mg via INTRAVENOUS

## 2017-11-05 MED ORDER — SODIUM CHLORIDE 0.9 % IV SOLN
INTRAVENOUS | Status: DC
  Administered 2017-11-05 – 2017-11-09 (×8): via INTRAVENOUS

## 2017-11-05 MED ORDER — BUPIVACAINE-EPINEPHRINE 0.5% -1:200000 IJ SOLN
INTRAMUSCULAR | Status: DC | PRN
  Administered 2017-11-05: 20 mL

## 2017-11-05 MED ORDER — STERILE WATER FOR IRRIGATION IR SOLN
Status: DC | PRN
  Administered 2017-11-05: 1000 mL

## 2017-11-05 MED ORDER — PIPERACILLIN-TAZOBACTAM 3.375 G IVPB
3.3750 g | Freq: Three times a day (TID) | INTRAVENOUS | Status: DC
Start: 2017-11-05 — End: 2017-11-09
  Administered 2017-11-05 – 2017-11-09 (×12): 3.375 g via INTRAVENOUS
  Filled 2017-11-05 (×14): qty 50

## 2017-11-05 MED ORDER — BUPIVACAINE-EPINEPHRINE (PF) 0.5% -1:200000 IJ SOLN
INTRAMUSCULAR | Status: AC
  Filled 2017-11-05: qty 30

## 2017-11-05 MED ORDER — ONDANSETRON HCL 4 MG/2ML IJ SOLN
INTRAMUSCULAR | Status: DC | PRN
  Administered 2017-11-05: 4 mg via INTRAVENOUS

## 2017-11-05 MED ORDER — MORPHINE SULFATE (PF) 4 MG/ML IV SOLN
1.0000 mg | INTRAVENOUS | Status: DC | PRN
  Administered 2017-11-05 – 2017-11-06 (×3): 4 mg via INTRAVENOUS
  Administered 2017-11-06: 2 mg via INTRAVENOUS
  Administered 2017-11-06 – 2017-11-09 (×11): 4 mg via INTRAVENOUS
  Administered 2017-11-09: 2 mg via INTRAVENOUS
  Filled 2017-11-05 (×16): qty 1

## 2017-11-05 MED ORDER — FENTANYL CITRATE (PF) 250 MCG/5ML IJ SOLN
INTRAMUSCULAR | Status: DC | PRN
  Administered 2017-11-05: 150 ug via INTRAVENOUS
  Administered 2017-11-05: 50 ug via INTRAVENOUS
  Administered 2017-11-05: 25 ug via INTRAVENOUS

## 2017-11-05 MED ORDER — LIDOCAINE 2% (20 MG/ML) 5 ML SYRINGE
INTRAMUSCULAR | Status: AC
  Filled 2017-11-05: qty 10

## 2017-11-05 MED ORDER — HYDROMORPHONE HCL 1 MG/ML IJ SOLN
0.2500 mg | INTRAMUSCULAR | Status: DC | PRN
  Administered 2017-11-05 (×2): 0.5 mg via INTRAVENOUS

## 2017-11-05 MED ORDER — DEXAMETHASONE SODIUM PHOSPHATE 4 MG/ML IJ SOLN
INTRAMUSCULAR | Status: DC | PRN
  Administered 2017-11-05: 10 mg via INTRAVENOUS

## 2017-11-05 MED ORDER — PROMETHAZINE HCL 25 MG/ML IJ SOLN
12.5000 mg | Freq: Four times a day (QID) | INTRAMUSCULAR | Status: DC | PRN
  Administered 2017-11-05 – 2017-11-07 (×4): 12.5 mg via INTRAVENOUS
  Filled 2017-11-05 (×5): qty 1

## 2017-11-05 MED ORDER — CHLORHEXIDINE GLUCONATE CLOTH 2 % EX PADS
6.0000 | MEDICATED_PAD | Freq: Every day | CUTANEOUS | Status: AC
  Administered 2017-11-06 – 2017-11-09 (×4): 6 via TOPICAL

## 2017-11-05 MED ORDER — PROPOFOL 10 MG/ML IV BOLUS
INTRAVENOUS | Status: DC | PRN
  Administered 2017-11-05: 20 mg via INTRAVENOUS
  Administered 2017-11-05: 150 mg via INTRAVENOUS
  Administered 2017-11-05: 30 mg via INTRAVENOUS
  Administered 2017-11-05 (×3): 20 mg via INTRAVENOUS

## 2017-11-05 MED ORDER — ONDANSETRON HCL 4 MG/2ML IJ SOLN
INTRAMUSCULAR | Status: AC
  Filled 2017-11-05: qty 4

## 2017-11-05 MED ORDER — MIDAZOLAM HCL 5 MG/5ML IJ SOLN
INTRAMUSCULAR | Status: DC | PRN
  Administered 2017-11-05: 1 mg via INTRAVENOUS

## 2017-11-05 MED ORDER — PHENYLEPHRINE HCL 10 MG/ML IJ SOLN
INTRAMUSCULAR | Status: DC | PRN
  Administered 2017-11-05 (×4): 80 ug via INTRAVENOUS

## 2017-11-05 MED ORDER — LIDOCAINE HCL (CARDIAC) 20 MG/ML IV SOLN
INTRAVENOUS | Status: DC | PRN
  Administered 2017-11-05: 100 mg via INTRAVENOUS

## 2017-11-05 MED ORDER — DIPHENHYDRAMINE HCL 50 MG/ML IJ SOLN
25.0000 mg | Freq: Once | INTRAMUSCULAR | Status: AC
  Administered 2017-11-05: 25 mg via INTRAVENOUS
  Filled 2017-11-05: qty 1

## 2017-11-05 MED ORDER — HYDROMORPHONE HCL 1 MG/ML IJ SOLN
INTRAMUSCULAR | Status: AC
  Administered 2017-11-05: 0.5 mg via INTRAVENOUS
  Filled 2017-11-05: qty 1

## 2017-11-05 MED ORDER — LACTATED RINGERS IV SOLN
INTRAVENOUS | Status: DC | PRN
  Administered 2017-11-05: 10:00:00 via INTRAVENOUS

## 2017-11-05 MED ORDER — MORPHINE SULFATE (PF) 4 MG/ML IV SOLN
2.0000 mg | INTRAVENOUS | Status: DC | PRN
  Administered 2017-11-05: 2 mg via INTRAVENOUS
  Filled 2017-11-05: qty 1

## 2017-11-05 MED ORDER — DEXAMETHASONE SODIUM PHOSPHATE 10 MG/ML IJ SOLN
INTRAMUSCULAR | Status: AC
  Filled 2017-11-05: qty 2

## 2017-11-05 MED ORDER — 0.9 % SODIUM CHLORIDE (POUR BTL) OPTIME
TOPICAL | Status: DC | PRN
  Administered 2017-11-05: 1000 mL

## 2017-11-05 MED ORDER — OXYCODONE HCL 5 MG PO TABS
5.0000 mg | ORAL_TABLET | ORAL | Status: DC | PRN
  Administered 2017-11-05 (×2): 10 mg via ORAL
  Filled 2017-11-05 (×2): qty 2

## 2017-11-05 MED ORDER — KCL IN DEXTROSE-NACL 20-5-0.9 MEQ/L-%-% IV SOLN
INTRAVENOUS | Status: DC
  Administered 2017-11-05: 04:00:00 via INTRAVENOUS
  Filled 2017-11-05: qty 1000

## 2017-11-05 MED ORDER — METRONIDAZOLE IN NACL 5-0.79 MG/ML-% IV SOLN
500.0000 mg | Freq: Three times a day (TID) | INTRAVENOUS | Status: DC
  Administered 2017-11-05: 500 mg via INTRAVENOUS
  Filled 2017-11-05 (×4): qty 100

## 2017-11-05 MED ORDER — SUGAMMADEX SODIUM 200 MG/2ML IV SOLN
INTRAVENOUS | Status: AC
  Filled 2017-11-05: qty 4

## 2017-11-05 MED ORDER — ONDANSETRON HCL 4 MG/2ML IJ SOLN
4.0000 mg | Freq: Four times a day (QID) | INTRAMUSCULAR | Status: DC | PRN
  Administered 2017-11-05 – 2017-11-09 (×3): 4 mg via INTRAVENOUS
  Filled 2017-11-05 (×3): qty 2

## 2017-11-05 MED ORDER — ENOXAPARIN SODIUM 40 MG/0.4ML ~~LOC~~ SOLN: 40.0000 mg | SUBCUTANEOUS | Status: DC

## 2017-11-05 MED ORDER — SUGAMMADEX SODIUM 200 MG/2ML IV SOLN
INTRAVENOUS | Status: DC | PRN
  Administered 2017-11-05: 154.6 mg via INTRAVENOUS

## 2017-11-05 SURGICAL SUPPLY — 55 items
APPLIER CLIP ROT 10 11.4 M/L (STAPLE)
BLADE SURG 10 STRL SS (BLADE) ×2 IMPLANT
CANISTER SUCT 3000ML PPV (MISCELLANEOUS) ×2 IMPLANT
CHLORAPREP W/TINT 26ML (MISCELLANEOUS) ×2 IMPLANT
CLIP APPLIE ROT 10 11.4 M/L (STAPLE) IMPLANT
COVER SURGICAL LIGHT HANDLE (MISCELLANEOUS) ×2 IMPLANT
CUTTER FLEX LINEAR 45M (STAPLE) ×2 IMPLANT
DERMABOND ADVANCED (GAUZE/BANDAGES/DRESSINGS) ×1
DERMABOND ADVANCED .7 DNX12 (GAUZE/BANDAGES/DRESSINGS) ×1 IMPLANT
DRAIN CHANNEL 19F RND (DRAIN) ×2 IMPLANT
DRSG TEGADERM 4X4.75 (GAUZE/BANDAGES/DRESSINGS) ×2 IMPLANT
ELECT CAUTERY BLADE 6.4 (BLADE) ×2 IMPLANT
ELECT REM PT RETURN 9FT ADLT (ELECTROSURGICAL) ×2
ELECTRODE REM PT RTRN 9FT ADLT (ELECTROSURGICAL) ×1 IMPLANT
EVACUATOR SILICONE 100CC (DRAIN) ×2 IMPLANT
GAUZE SPONGE 2X2 8PLY STRL LF (GAUZE/BANDAGES/DRESSINGS) ×1 IMPLANT
GLOVE BIO SURGEON STRL SZ7.5 (GLOVE) ×2 IMPLANT
GLOVE BIOGEL PI IND STRL 6.5 (GLOVE) ×1 IMPLANT
GLOVE BIOGEL PI IND STRL 7.0 (GLOVE) ×1 IMPLANT
GLOVE BIOGEL PI IND STRL 7.5 (GLOVE) ×1 IMPLANT
GLOVE BIOGEL PI IND STRL 8.5 (GLOVE) ×1 IMPLANT
GLOVE BIOGEL PI INDICATOR 6.5 (GLOVE) ×1
GLOVE BIOGEL PI INDICATOR 7.0 (GLOVE) ×1
GLOVE BIOGEL PI INDICATOR 7.5 (GLOVE) ×1
GLOVE BIOGEL PI INDICATOR 8.5 (GLOVE) ×1
GLOVE ECLIPSE 8.0 STRL XLNG CF (GLOVE) ×2 IMPLANT
GLOVE SURG SIGNA 7.5 PF LTX (GLOVE) ×2 IMPLANT
GLOVE SURG SS PI 6.0 STRL IVOR (GLOVE) ×2 IMPLANT
GOWN SPEC L3 XXLG W/TWL (GOWN DISPOSABLE) ×2 IMPLANT
GOWN STRL REUS W/ TWL LRG LVL3 (GOWN DISPOSABLE) ×2 IMPLANT
GOWN STRL REUS W/ TWL XL LVL3 (GOWN DISPOSABLE) ×1 IMPLANT
GOWN STRL REUS W/TWL LRG LVL3 (GOWN DISPOSABLE) ×2
GOWN STRL REUS W/TWL XL LVL3 (GOWN DISPOSABLE) ×1
KIT BASIN OR (CUSTOM PROCEDURE TRAY) ×2 IMPLANT
KIT ROOM TURNOVER OR (KITS) ×2 IMPLANT
NS IRRIG 1000ML POUR BTL (IV SOLUTION) ×2 IMPLANT
PAD ARMBOARD 7.5X6 YLW CONV (MISCELLANEOUS) ×4 IMPLANT
PENCIL BUTTON HOLSTER BLD 10FT (ELECTRODE) ×2 IMPLANT
POUCH SPECIMEN RETRIEVAL 10MM (ENDOMECHANICALS) ×2 IMPLANT
RELOAD 45 VASCULAR/THIN (ENDOMECHANICALS) IMPLANT
RELOAD STAPLE TA45 3.5 REG BLU (ENDOMECHANICALS) ×4 IMPLANT
SET IRRIG TUBING LAPAROSCOPIC (IRRIGATION / IRRIGATOR) ×2 IMPLANT
SHEARS HARMONIC ACE PLUS 36CM (ENDOMECHANICALS) ×2 IMPLANT
SLEEVE ENDOPATH XCEL 5M (ENDOMECHANICALS) ×2 IMPLANT
SPECIMEN JAR SMALL (MISCELLANEOUS) ×2 IMPLANT
SPONGE GAUZE 2X2 STER 10/PKG (GAUZE/BANDAGES/DRESSINGS) ×1
SUT ETHILON 2 0 FS 18 (SUTURE) ×2 IMPLANT
SUT MON AB 4-0 PC3 18 (SUTURE) ×2 IMPLANT
TOWEL OR 17X24 6PK STRL BLUE (TOWEL DISPOSABLE) ×2 IMPLANT
TOWEL OR 17X26 10 PK STRL BLUE (TOWEL DISPOSABLE) ×2 IMPLANT
TRAY LAPAROSCOPIC MC (CUSTOM PROCEDURE TRAY) ×2 IMPLANT
TROCAR XCEL BLUNT TIP 100MML (ENDOMECHANICALS) ×2 IMPLANT
TROCAR XCEL NON-BLD 5MMX100MML (ENDOMECHANICALS) ×2 IMPLANT
TUBE CONNECTING 12X1/4 (SUCTIONS) ×2 IMPLANT
TUBING INSUFFLATION (TUBING) ×2 IMPLANT

## 2017-11-05 NOTE — Progress Notes (Signed)
Report called, belongings secured and patient prepared for transport.

## 2017-11-05 NOTE — Progress Notes (Signed)
Beth Frost is a 46 y.o. female patient admitted from ED awake, alert - oriented  X 4 - no acute distress noted.  VSS - Blood pressure (!) 106/59, pulse 89, temperature 99 F (37.2 C), temperature source Oral, resp. rate 19, last menstrual period 10/21/2017, SpO2 100 %.    IV in place, occlusive dsg intact without redness.    Will cont to eval and treat per MD orders.  Vidal Schwalbe, RN 11/05/2017 2:05 AM

## 2017-11-05 NOTE — Anesthesia Postprocedure Evaluation (Signed)
Anesthesia Post Note  Patient: Beth Frost  Procedure(s) Performed: APPENDECTOMY LAPAROSCOPIC (N/A Abdomen)     Patient location during evaluation: PACU Anesthesia Type: General Level of consciousness: awake and alert Pain management: pain level controlled Vital Signs Assessment: post-procedure vital signs reviewed and stable Respiratory status: spontaneous breathing, nonlabored ventilation, respiratory function stable and patient connected to nasal cannula oxygen Cardiovascular status: blood pressure returned to baseline and stable Postop Assessment: no apparent nausea or vomiting Anesthetic complications: no    Last Vitals:  Vitals:   11/05/17 1219 11/05/17 1225  BP: 104/62   Pulse: 97   Resp: 20   Temp: 36.7 C   SpO2: (!) 85% 93%    Last Pain:  Vitals:   11/05/17 1408  TempSrc:   PainSc: 10-Worst pain ever                 [employer]

## 2017-11-05 NOTE — Progress Notes (Signed)
Patient ID: Beth Frost, female   DOB: [birth year], 46 y.o.   MRN: [id number]  Pre Procedure note for inpatients:   Luara Blocker has been scheduled for Procedure(s): APPENDECTOMY LAPAROSCOPIC (N/A) today. The various methods of treatment have been discussed with the patient. After consideration of the risks, benefits and treatment options the patient has consented to the planned procedure. The risks include but are not limited to bleeding, infection, injury to surrounding structures, the need to convert to an open procedure, the need for further procedures, post op recovery, etc.  The patient has been seen and labs reviewed. There are no changes in the patient's condition to prevent proceeding with the planned procedure today.  Recent labs:  Lab Results  Component Value Date   WBC 13.6 (H) 11/05/2017   HGB 11.3 (L) 11/05/2017   HCT 31.8 (L) 11/05/2017   PLT 318 11/05/2017   GLUCOSE 130 (H) 11/05/2017   CHOL 211 (H) 11/26/2016   TRIG 89 11/26/2016   HDL 100 11/26/2016   LDLCALC 93 11/26/2016   ALT 15 11/05/2017   AST 17 11/05/2017   NA 136 11/05/2017   K 3.5 11/05/2017   CL 104 11/05/2017   CREATININE 0.48 11/05/2017   BUN <5 (L) 11/05/2017   CO2 25 11/05/2017   TSH 1.81 11/26/2016   INR 1.12 11/04/2017    Jenya Putz A, MD 11/05/2017 7:21 AM

## 2017-11-05 NOTE — Anesthesia Preprocedure Evaluation (Addendum)
Anesthesia Evaluation  Patient identified by MRN, date of birth, ID band Patient awake    Reviewed: Allergy & Precautions, H&P , Patient's Chart, lab work & pertinent test results, reviewed documented beta blocker date and time   Airway Mallampati: II  TM Distance: >3 FB Neck ROM: full    Dental no notable dental hx.    Pulmonary asthma ,    Pulmonary exam normal breath sounds clear to auscultation       Cardiovascular hypertension,  Rhythm:regular Rate:Normal     Neuro/Psych    GI/Hepatic   Endo/Other    Renal/GU      Musculoskeletal   Abdominal   Peds  Hematology   Anesthesia Other Findings   Reproductive/Obstetrics                             Anesthesia Physical Anesthesia Plan  ASA: II  Anesthesia Plan: General   Post-op Pain Management:    Induction: Intravenous  PONV Risk Score and Plan: 3 and Dexamethasone, Ondansetron, Treatment may vary due to age or medical condition and Scopolamine patch - Pre-op  Airway Management Planned: Oral ETT  Additional Equipment:   Intra-op Plan:   Post-operative Plan: Extubation in OR  Informed Consent: I have reviewed the patients History and Physical, chart, labs and discussed the procedure including the risks, benefits and alternatives for the proposed anesthesia with the patient or authorized representative who has indicated his/her understanding and acceptance.   Dental Advisory Given  Plan Discussed with: CRNA and Surgeon  Anesthesia Plan Comments: (  )       Anesthesia Quick Evaluation

## 2017-11-05 NOTE — Op Note (Signed)
APPENDECTOMY LAPAROSCOPIC  Procedure Note  Beth Frost 11/04/2017 - 11/05/2017   Pre-op Diagnosis: appendicitis     Post-op Diagnosis: Perforated appendicitis  Procedure(s): APPENDECTOMY LAPAROSCOPIC  Surgeon(s): Coralie Keens, MD  Anesthesia: General  Staff:  Circulator: Rozell Searing, RN Scrub Person: Quincy Carnes, RN; Darlis Loan, Otila Back, RN Circulator Assistant: Quincy Carnes, RN  Estimated Blood Loss: less than 50 mL               Specimens: Sent to path  Findings: The patient was found to have Frost perforated appendix with abscess and Frost large phlegmon.  The right fallopian tube was also inflamed secondary to the appendicitis.  Procedure: The patient was brought to the operating room and identified as the correct patient.  She was placed supine on the operating table and general anesthesia was induced.  Her abdomen was then prepped and draped in the usual sterile fashion.  I made Frost small vertical incision through her previous scar below the umbilicus.  I carried this down to the fascia which was then opened with Frost scalpel.  Frost hemostat was then used to pass into the peritoneal cavity under direct vision.  Frost 0 Vicryl pursestring suture was placed around the fascial opening.  The [facility] port was placed through the opening and insufflation of the abdomen was begun.  I then placed Frost 5 mm trocar in the patient's right upper quadrant and another in the left lower quadrant under direct vision.  The patient had Frost phlegmon in the right lower quadrant.  As soon as I grasped it gross purulence was identified.  The phlegmon was stuck to the pelvic sidewall as well as to the right fallopian tube and ovary.  The fallopian tube itself was distended.  I was able to easily bluntly dissect the phlegmon free from the abdominal sidewall and the pelvis.  Phlegmon was stuck to the side of the small bowel but there was separated.  Part of the appendix was going right into  the phlegmon/transected at this with the laparoscopic stapling device and removed it.  It became more evident that the actual base of the appendix was more retrocecal.  I was then able to identify the retrocecal base of the appendix and transected with the laparoscopic stapling device.  The mesoappendix was taken down with harmonic scalpel.  I then separated the rest of the phlegmon in the central portion of the appendix which was ruptured from off of the small bowel and abscess cavity that had formed.  I then was able to remove sections of the remaining appendix piecemeal.  Once the appendix was completely removed I irrigated the right lower quadrant with Frost liter of saline.  I then made Frost separate skin incision and placed Frost 19 [place] Blake drain through the umbilical trocar and pulled it out of the incision in the right lower quadrant under direct vision.  I placed the drain partly into the pelvis and right lower quadrant.  Again, hemostasis appeared to be achieved.  The drain was placed to bulb suction.  We removed the umbilical trocar and closed the incision at the umbilicus with Frost 0 Vicryl suture tying it in place.  The trochars were then removed under direct vision the abdomen was deflated.  All incisions were then anesthetized with Marcaine and closed with 4-0 Monocryl sutures.  Dermabond was then applied.  The patient tolerated the procedure well.  All the counts were correct at the end the procedure.  The patient was then extubated in the operating room and taken in Frost stable condition to the recovery room.          Beth Frost   Date: 11/05/2017  Time: 10:59 AM

## 2017-11-05 NOTE — Progress Notes (Signed)
Patient returned to unit. Patient shallow breathing with oxygen saturations around 88%, 2.5 L of oxygen via nasal cannula was applied. Patient resting but has complaints of nausea. MD notified and new orders were placed and will be followed. Will continue to monitor patient status.

## 2017-11-05 NOTE — Anesthesia Procedure Notes (Signed)
Procedure Name: Intubation Date/Time: 11/05/2017 9:42 AM Performed by: Glynda Jaeger, CRNA Pre-anesthesia Checklist: Patient identified, Patient being monitored, Timeout performed, Emergency Drugs available and Suction available Patient Re-evaluated:Patient Re-evaluated prior to induction Oxygen Delivery Method: Circle System Utilized Preoxygenation: Pre-oxygenation with 100% oxygen Induction Type: IV induction Ventilation: Mask ventilation without difficulty Laryngoscope Size: 3 and Glidescope Grade View: Grade I Tube type: Oral Tube size: 7.5 mm Number of attempts: 1 Airway Equipment and Method: Video-laryngoscopy Placement Confirmation: ETT inserted through vocal cords under direct vision,  positive ETCO2 and breath sounds checked- equal and bilateral Secured at: 21 cm Tube secured with: Tape Dental Injury: Teeth and Oropharynx as per pre-operative assessment

## 2017-11-05 NOTE — Transfer of Care (Signed)
Immediate Anesthesia Transfer of Care Note  Patient: Destynee Blocker  Procedure(s) Performed: APPENDECTOMY LAPAROSCOPIC (N/A Abdomen)  Patient Location: PACU  Anesthesia Type:General  Level of Consciousness: awake, patient cooperative and responds to stimulation  Airway & Oxygen Therapy: Patient Spontanous Breathing and Patient connected to face mask oxygen  Post-op Assessment: Report given to RN, Post -op Vital signs reviewed and stable and Patient moving all extremities X 4  Post vital signs: Reviewed and stable  Last Vitals:  Vitals:   11/05/17 0512 11/05/17 1119  BP: (!) 95/50 (!) (P) 108/52  Pulse: 87   Resp: 20 (!) (P) 28  Temp: 37.1 C (P) 36.9 C  SpO2: 96%     Last Pain:  Vitals:   11/05/17 0831  TempSrc:   PainSc: 9          Complications: No apparent anesthesia complications

## 2017-11-05 NOTE — OR Nursing (Signed)
Patient unable to removed earring in right ear. Tape was applied.  Patient was informed of potential risks not removing jewelry.

## 2017-11-06 ENCOUNTER — Encounter (HOSPITAL_COMMUNITY): Payer: Self-pay | Admitting: Surgery

## 2017-11-06 LAB — CBC
HCT: 31.9 % — ABNORMAL LOW (ref 36.0–46.0)
Hemoglobin: 11.1 g/dL — ABNORMAL LOW (ref 12.0–15.0)
MCH: 27.6 pg (ref 26.0–34.0)
MCHC: 34.8 g/dL (ref 30.0–36.0)
MCV: 79.4 fL (ref 78.0–100.0)
PLATELETS: 359 10*3/uL (ref 150–400)
RBC: 4.02 MIL/uL (ref 3.87–5.11)
RDW: 14.2 % (ref 11.5–15.5)
WBC: 19 10*3/uL — AB (ref 4.0–10.5)

## 2017-11-06 LAB — BASIC METABOLIC PANEL
ANION GAP: 10 (ref 5–15)
BUN: <5 mg/dL — ABNORMAL LOW (ref 6–20)
CALCIUM: 8.1 mg/dL — AB (ref 8.9–10.3)
CO2: 24 mmol/L (ref 22–32)
Chloride: 104 mmol/L (ref 101–111)
Creatinine, Ser: 0.55 mg/dL (ref 0.44–1.00)
GLUCOSE: 97 mg/dL (ref 65–99)
POTASSIUM: 4.1 mmol/L (ref 3.5–5.1)
SODIUM: 138 mmol/L (ref 135–145)

## 2017-11-06 LAB — GC/CHLAMYDIA PROBE AMP
Chlamydia trachomatis, NAA: NEGATIVE
NEISSERIA GONORRHOEAE BY PCR: NEGATIVE

## 2017-11-06 NOTE — Progress Notes (Signed)
1 Day Post-Op  Subjective: Still has moderate pain.  Sitting up in chair.  Alert and cooperative. Voiding without difficulty. Afebrile.  Heart rate 95.  Respirations on labored.  Objective: Vital signs in last 24 hours: Temp:  [98.1 F (36.7 C)-99.8 F (37.7 C)] 98.2 F (36.8 C) (12/15 0956) Pulse Rate:  [81-100] 99 (12/15 0956) Resp:  [17-28] 17 (12/15 0458) BP: (104-126)/(52-81) 121/81 (12/15 0956) SpO2:  [85 %-100 %] 100 % (12/15 0956) Last BM Date: 11/04/17  Intake/Output from previous day: 12/14 0701 - 12/15 0700 In: 3075 [P.O.:450; I.V.:2525; IV Piggyback:100] Out: 1065 [Urine:850; Drains:190; Blood:25] Intake/Output this shift: Total I/O In: 352 [P.O.:352] Out: 450 [Urine:400; Drains:50]  General appearance: alert.  Skin warm and dry.  Mental status normal.  Mild distress. Resp: clear to auscultation bilaterally GI: soft.  Wounds okay.  Mild diffuse tenderness.  JP drainage serosanguineous.  Moderate volume. Extremities: no edema or tenderness.  Lab Results:  Results for orders placed or performed during the hospital encounter of 11/04/17 (from the past 24 hour(s))  CBC     Status: Abnormal   Collection Time: 11/06/17  6:50 AM  Result Value Ref Range   WBC 19.0 (H) 4.0 - 10.5 K/uL   RBC 4.02 3.87 - 5.11 MIL/uL   Hemoglobin 11.1 (L) 12.0 - 15.0 g/dL   HCT 31.9 (L) 36.0 - 46.0 %   MCV 79.4 78.0 - 100.0 fL   MCH 27.6 26.0 - 34.0 pg   MCHC 34.8 30.0 - 36.0 g/dL   RDW 14.2 11.5 - 15.5 %   Platelets 359 150 - 400 K/uL  Basic metabolic panel     Status: Abnormal   Collection Time: 11/06/17  6:50 AM  Result Value Ref Range   Sodium 138 135 - 145 mmol/L   Potassium 4.1 3.5 - 5.1 mmol/L   Chloride 104 101 - 111 mmol/L   CO2 24 22 - 32 mmol/L   Glucose, Bld 97 65 - 99 mg/dL   BUN <5 (L) 6 - 20 mg/dL   Creatinine, Ser 0.55 0.44 - 1.00 mg/dL   Calcium 8.1 (L) 8.9 - 10.3 mg/dL   GFR calc non Af Amer >60 >60 mL/min   GFR calc Af Amer >60 >60 mL/min   Anion gap 10 5  - 15     Studies/Results: No results found.  . Chlorhexidine Gluconate Cloth  6 each Topical Daily  . enoxaparin (LOVENOX) injection  40 mg Subcutaneous Q24H  . mupirocin ointment  1 application Nasal BID     Assessment/Plan: s/p Procedure(s): APPENDECTOMY LAPAROSCOPIC  Ruptured appendicitis with peritonitis and abscess and phlegmon.  POD #1.  Laparoscopic appendectomy and drainage of abscess -Stable. -Clear liquids.  Anticipate ileus -Mobilize -Incentive spirometry -Lovenox for DVT prophylaxis -IV Zosyn.  Will need antibiotics for 10 days at least.  Hypertension Depression Asthma History cholecystectomy  @PROBHOSP @  LOS: 1 day    Beth Frost 11/06/2017  . .prob

## 2017-11-07 NOTE — Progress Notes (Signed)
2 Days Post-Op  Subjective: Alert and stable.  No stool or flatus.  Some nausea but no vomiting.  Voiding well.  Pain about the same. Afebrile.  Hemodynamically stable.  Heart rate 100. No labs today  Objective: Vital signs in last 24 hours: Temp:  [98.2 F (36.8 C)-98.7 F (37.1 C)] 98.7 F (37.1 C) (12/16 0502) Pulse Rate:  [93-101] 101 (12/16 0502) Resp:  [15-18] 16 (12/16 0502) BP: (119-132)/(66-86) 129/72 (12/16 0502) SpO2:  [94 %-100 %] 94 % (12/16 0502) Last BM Date: 11/04/17  Intake/Output from previous day: 12/15 0701 - 12/16 0700 In: 2684 [P.O.:464; I.V.:2120; IV Piggyback:100] Out: 1565 [Urine:900; Drains:665] Intake/Output this shift: No intake/output data recorded.   General appearance: alert.  Skin warm and dry.  Mental status normal.  Mild distress. Resp: clear to auscultation bilaterally GI: soft.  Wounds okay.  Mild diffuse tenderness.  JP drainage Volumes high, 665 mL, but serosanguineous, nonpurulent, nonenteric.  Extremities: no edema or tenderness.     Lab Results:  No results found for this or any previous visit (from the past 24 hour(s)).   Studies/Results: No results found.  . Chlorhexidine Gluconate Cloth  6 each Topical Daily  . enoxaparin (LOVENOX) injection  40 mg Subcutaneous Q24H  . mupirocin ointment  1 application Nasal BID     Assessment/Plan: s/p Procedure(s): APPENDECTOMY LAPAROSCOPIC    Ruptured appendicitis with peritonitis and abscess and phlegmon.  POD #2.  Laparoscopic appendectomy and drainage of abscess -Stable. -Clear liquids.  Anticipate ileus -Mobilize -Incentive spirometry -Lovenox for DVT prophylaxis -IV Zosyn.  Will need antibiotics for 10 days at least -LABS TOMORROW  Hypertension Depression Asthma History cholecystectomy    @PROBHOSP @  LOS: 2 days    Beth Frost 11/07/2017  . .prob

## 2017-11-08 LAB — BASIC METABOLIC PANEL
Anion gap: 8 (ref 5–15)
CALCIUM: 7.8 mg/dL — AB (ref 8.9–10.3)
CO2: 23 mmol/L (ref 22–32)
CREATININE: 0.43 mg/dL — AB (ref 0.44–1.00)
Chloride: 108 mmol/L (ref 101–111)
GFR calc non Af Amer: >60 mL/min (ref >60)
GLUCOSE: 83 mg/dL (ref 65–99)
Potassium: 3.1 mmol/L — ABNORMAL LOW (ref 3.5–5.1)
Sodium: 139 mmol/L (ref 135–145)

## 2017-11-08 LAB — CBC
HEMATOCRIT: 30.9 % — AB (ref 36.0–46.0)
Hemoglobin: 10.7 g/dL — ABNORMAL LOW (ref 12.0–15.0)
MCH: 27 pg (ref 26.0–34.0)
MCHC: 34.6 g/dL (ref 30.0–36.0)
MCV: 78 fL (ref 78.0–100.0)
Platelets: 387 10*3/uL (ref 150–400)
RBC: 3.96 MIL/uL (ref 3.87–5.11)
RDW: 13.8 % (ref 11.5–15.5)
WBC: 10.8 10*3/uL — ABNORMAL HIGH (ref 4.0–10.5)

## 2017-11-08 MED ORDER — ALBUTEROL SULFATE (2.5 MG/3ML) 0.083% IN NEBU: 3.0000 mL | INHALATION_SOLUTION | Freq: Four times a day (QID) | RESPIRATORY_TRACT | Status: DC | PRN

## 2017-11-08 MED ORDER — POTASSIUM CHLORIDE CRYS ER 20 MEQ PO TBCR
20.0000 meq | EXTENDED_RELEASE_TABLET | Freq: Two times a day (BID) | ORAL | Status: DC
  Administered 2017-11-08 – 2017-11-10 (×5): 20 meq via ORAL
  Filled 2017-11-08 (×5): qty 1

## 2017-11-08 NOTE — Progress Notes (Signed)
[facility]/Trauma Progress Note  3 Days Post-Op   Assessment/Plan Asthma - uses an inhaler at home HTN  Ruptured appendicitis - S/P lap appy and placement of drain, Dr. Ninfa Linden, 12/14 - continue IV abx - ileus appears to be resolving, pt had BM yesterday  FEN: fulls, advance as tolerated, PO K replacement VTE: SCD's, lovenox ID: [email or web address] 12/14>> Follow up: Dr. Ninfa Linden 2 weeks  DISPO: advance diet, continue IV abx, continue drain, encourage ambulation, likely home tomorrow, AM labs    LOS: 3 days    Subjective:  CC: pain in RLQ  Pt states pain is improving. Still very sore at drain site. No nausea or vomiting. Tolerating clears. Had a loose BM yesterday. No fever, chills, cough. She states she has asthma and feels like she is wheezing a little. She uses an inhaler at home.   Objective: Vital signs in last 24 hours: Temp:  [98.6 F (37 C)-99.2 F (37.3 C)] 98.6 F (37 C) (12/17 0502) Pulse Rate:  [101-104] 101 (12/17 0502) Resp:  [16] 16 (12/17 0502) BP: (128-134)/(73-84) 134/84 (12/17 0502) SpO2:  [91 %-93 %] 93 % (12/17 0502) Last BM Date: 11/07/17  Intake/Output from previous day: 12/16 0701 - 12/17 0700 In: 1260 [P.O.:360; I.V.:800; IV Piggyback:100] Out: 330 [Urine:300; Drains:30] Intake/Output this shift: Total I/O In: 120 [P.O.:120] Out: 500 [Urine:500]  PE: Gen:  Alert, NAD, pleasant, cooperative Card:  RRR, no M/G/R heard Pulm:  CTA, no W/R/R, rate and effort normal Abd: Soft, not distended, +BS, incisions with glue intact appear well healing, drain with minimal serous drainage, moderate TTP in RLQ, drain site C/D/I Skin: no rashes noted, warm and dry   Anti-infectives: Anti-infectives (From admission, onward)   Start     Dose/Rate Route Frequency Ordered Stop   11/05/17 2200  cefTRIAXone (ROCEPHIN) 2 g in dextrose 5 % 50 mL IVPB  Status:  Discontinued     2 g 100 mL/hr over 30 Minutes Intravenous Every 24 hours 11/05/17 0127  11/05/17 1219   11/05/17 1400  piperacillin-tazobactam (ZOSYN) IVPB 3.375 g     3.375 g 12.5 mL/hr over 240 Minutes Intravenous Every 8 hours 11/05/17 1252     11/05/17 0800  metroNIDAZOLE (FLAGYL) IVPB 500 mg  Status:  Discontinued     500 mg 100 mL/hr over 60 Minutes Intravenous Every 8 hours 11/05/17 0127 11/05/17 1219   11/04/17 2245  cefTRIAXone (ROCEPHIN) 2 g in dextrose 5 % 50 mL IVPB     2 g 100 mL/hr over 30 Minutes Intravenous  Once 11/04/17 2236 11/05/17 0012   11/04/17 2245  metroNIDAZOLE (FLAGYL) IVPB 500 mg     500 mg 100 mL/hr over 60 Minutes Intravenous  Once 11/04/17 2236 11/05/17 0212      Lab Results:  Recent Labs    11/06/17 0650 11/08/17 0530  WBC 19.0* 10.8*  HGB 11.1* 10.7*  HCT 31.9* 30.9*  PLT 359 387   BMET Recent Labs    11/06/17 0650 11/08/17 0530  NA 138 139  K 4.1 3.1*  CL 104 108  CO2 24 23  GLUCOSE 97 83  BUN <5* PENDING  CREATININE 0.55 0.43*  CALCIUM 8.1* 7.8*   PT/INR No results for input(s): LABPROT, INR in the last 72 hours. CMP     Component Value Date/Time   NA 139 11/08/2017 0530   NA 140 11/04/2017 1544   K 3.1 (L) 11/08/2017 0530   CL 108 11/08/2017 0530   CO2 23 11/08/2017 0530  GLUCOSE 83 11/08/2017 0530   BUN PENDING 11/08/2017 0530   BUN 4 (L) 11/04/2017 1544   CREATININE 0.43 (L) 11/08/2017 0530   CREATININE 0.56 11/26/2016 1131   CALCIUM 7.8 (L) 11/08/2017 0530   PROT 6.3 (L) 11/05/2017 0545   PROT 6.6 11/04/2017 1544   ALBUMIN 2.8 (L) 11/05/2017 0545   ALBUMIN 3.9 11/04/2017 1544   AST 17 11/05/2017 0545   ALT 15 11/05/2017 0545   ALKPHOS 66 11/05/2017 0545   BILITOT 0.9 11/05/2017 0545   BILITOT 0.8 11/04/2017 1544   GFRNONAA >60 11/08/2017 0530   GFRNONAA >89 03/30/2016 1802   GFRAA >60 11/08/2017 0530   GFRAA >89 03/30/2016 1802   Lipase     Component Value Date/Time   LIPASE 18 05/11/2014 0219    Studies/Results: No results found.     Kalman Drape , [facility] 11/08/2017, 8:59 AM Pager: [phone] Consults: [phone] Mon-Fri 7:00 am-4:30 pm Sat-Sun 7:00 am-11:30 am

## 2017-11-09 LAB — BASIC METABOLIC PANEL
Anion gap: 7 (ref 5–15)
BUN: <5 mg/dL — ABNORMAL LOW (ref 6–20)
CALCIUM: 8.3 mg/dL — AB (ref 8.9–10.3)
CO2: 26 mmol/L (ref 22–32)
Chloride: 106 mmol/L (ref 101–111)
Creatinine, Ser: 0.42 mg/dL — ABNORMAL LOW (ref 0.44–1.00)
GFR calc Af Amer: >60 mL/min (ref >60)
GLUCOSE: 91 mg/dL (ref 65–99)
POTASSIUM: 3.3 mmol/L — AB (ref 3.5–5.1)
Sodium: 139 mmol/L (ref 135–145)

## 2017-11-09 LAB — CBC
HEMATOCRIT: 32.9 % — AB (ref 36.0–46.0)
Hemoglobin: 11.5 g/dL — ABNORMAL LOW (ref 12.0–15.0)
MCH: 27.2 pg (ref 26.0–34.0)
MCHC: 35 g/dL (ref 30.0–36.0)
MCV: 77.8 fL — ABNORMAL LOW (ref 78.0–100.0)
Platelets: 393 10*3/uL (ref 150–400)
RBC: 4.23 MIL/uL (ref 3.87–5.11)
RDW: 13.8 % (ref 11.5–15.5)
WBC: 9.8 10*3/uL (ref 4.0–10.5)

## 2017-11-09 LAB — CREATININE, SERUM
CREATININE: 0.46 mg/dL (ref 0.44–1.00)
GFR calc Af Amer: >60 mL/min (ref >60)

## 2017-11-09 MED ORDER — ACETAMINOPHEN 500 MG PO TABS
1000.0000 mg | ORAL_TABLET | Freq: Three times a day (TID) | ORAL | Status: DC
  Administered 2017-11-09 – 2017-11-10 (×4): 1000 mg via ORAL
  Filled 2017-11-09 (×4): qty 2

## 2017-11-09 MED ORDER — TRAMADOL HCL 50 MG PO TABS
50.0000 mg | ORAL_TABLET | Freq: Four times a day (QID) | ORAL | Status: DC | PRN
  Administered 2017-11-09 – 2017-11-10 (×3): 100 mg via ORAL
  Filled 2017-11-09 (×3): qty 2

## 2017-11-09 MED ORDER — AMOXICILLIN-POT CLAVULANATE 875-125 MG PO TABS
1.0000 | ORAL_TABLET | Freq: Two times a day (BID) | ORAL | Status: DC
  Administered 2017-11-09 – 2017-11-10 (×3): 1 via ORAL
  Filled 2017-11-09 (×3): qty 1

## 2017-11-09 MED ORDER — MORPHINE SULFATE (PF) 4 MG/ML IV SOLN
1.0000 mg | INTRAVENOUS | Status: DC | PRN
  Administered 2017-11-09: 2 mg via INTRAVENOUS
  Filled 2017-11-09 (×2): qty 1

## 2017-11-09 NOTE — Progress Notes (Signed)
[facility] Progress Note  4 Days Post-Op  Subjective: CC: abdominal pain Patient with RLQ abdominal pain. Tolerated FLD. Denies n/v. Had a BM yesterday. Wanting to get home before the holiday. UOP good. VSS.   Objective: Vital signs in last 24 hours: Temp:  [98.1 F (36.7 C)-98.8 F (37.1 C)] 98.1 F (36.7 C) (12/18 0516) Pulse Rate:  [79-99] 79 (12/18 0516) Resp:  [16-17] 17 (12/18 0516) BP: (119-144)/(65-85) 135/77 (12/18 0516) SpO2:  [93 %-97 %] 97 % (12/18 0516) Last BM Date: 11/08/17  Intake/Output from previous day: 12/17 0701 - 12/18 0700 In: 2434.7 [P.O.:360; I.V.:1974.7; IV Piggyback:100] Out: 4830 [Urine:4450; Drains:380] Intake/Output this shift: No intake/output data recorded.  PE: Gen:  Alert, NAD, pleasant Card:  Regular rate and rhythm, pedal pulses 2+ BL Pulm:  Normal effort, clear to auscultation bilaterally Abd: Soft, TTP in RLQ, non-distended, bowel sounds present in all 4 quadrants, no HSM, incisions C/D/I, drain in RLQ with serous fluid Skin: warm and dry, no rashes  Psych: A&Ox3   Lab Results:  Recent Labs    11/08/17 0530 11/09/17 0525  WBC 10.8* 9.8  HGB 10.7* 11.5*  HCT 30.9* 32.9*  PLT 387 393   BMET Recent Labs    11/08/17 0530 11/09/17 0525  NA 139 139  K 3.1* 3.3*  CL 108 106  CO2 23 26  GLUCOSE 83 91  BUN <5* <5*  CREATININE 0.43* 0.42*  CALCIUM 7.8* 8.3*   PT/INR No results for input(s): LABPROT, INR in the last 72 hours. CMP     Component Value Date/Time   NA 139 11/09/2017 0525   NA 140 11/04/2017 1544   K 3.3 (L) 11/09/2017 0525   CL 106 11/09/2017 0525   CO2 26 11/09/2017 0525   GLUCOSE 91 11/09/2017 0525   BUN <5 (L) 11/09/2017 0525   BUN 4 (L) 11/04/2017 1544   CREATININE 0.42 (L) 11/09/2017 0525   CREATININE 0.56 11/26/2016 1131   CALCIUM 8.3 (L) 11/09/2017 0525   PROT 6.3 (L) 11/05/2017 0545   PROT 6.6 11/04/2017 1544   ALBUMIN 2.8 (L) 11/05/2017 0545   ALBUMIN 3.9 11/04/2017 1544   AST 17 11/05/2017 0545   ALT 15 11/05/2017 0545   ALKPHOS 66 11/05/2017 0545   BILITOT 0.9 11/05/2017 0545   BILITOT 0.8 11/04/2017 1544   GFRNONAA >60 11/09/2017 0525   GFRNONAA >89 03/30/2016 1802   GFRAA >60 11/09/2017 0525   GFRAA >89 03/30/2016 1802   Lipase     Component Value Date/Time   LIPASE 18 05/11/2014 0219       Studies/Results: No results found.  Anti-infectives: Anti-infectives (From admission, onward)   Start     Dose/Rate Route Frequency Ordered Stop   11/05/17 2200  cefTRIAXone (ROCEPHIN) 2 g in dextrose 5 % 50 mL IVPB  Status:  Discontinued     2 g 100 mL/hr over 30 Minutes Intravenous Every 24 hours 11/05/17 0127 11/05/17 1219   11/05/17 1400  piperacillin-tazobactam (ZOSYN) IVPB 3.375 g     3.375 g 12.5 mL/hr over 240 Minutes Intravenous Every 8 hours 11/05/17 1252     11/05/17 0800  metroNIDAZOLE (FLAGYL) IVPB 500 mg  Status:  Discontinued     500 mg 100 mL/hr over 60 Minutes Intravenous Every 8 hours 11/05/17 0127 11/05/17 1219   11/04/17 2245  cefTRIAXone (ROCEPHIN) 2 g in dextrose 5 % 50 mL IVPB     2 g 100 mL/hr over 30 Minutes Intravenous  Once 11/04/17 2236  11/05/17 0012   11/04/17 2245  metroNIDAZOLE (FLAGYL) IVPB 500 mg     500 mg 100 mL/hr over 60 Minutes Intravenous  Once 11/04/17 2236 11/05/17 0212       Assessment/Plan Asthma - uses an inhaler at home HTN  Ruptured appendicitis - S/P lap appy and placement of drain, Dr. Ninfa Linden, 12/14 - WBC 9.8, afebrile - transition to PO abx and will send home on PO abx - drain with 380 cc output - continue to monitor, if output remains high will check creatinine - ileus appears to be resolving, pt had BM yesterday  FEN: advance to soft diet, PO K replacement VTE: SCD's, lovenox  ID: [email or web address] 12/14>> Follow up: Dr. Ninfa Linden 2 weeks  DISPO: advance diet, continue abx, continue drain, encourage ambulation, likely home tomorrow, AM labs    LOS: 4 days    Brigid Re , [facility] 11/09/2017, 9:59 AM Pager: [phone] Consults: [phone] Mon-Fri 7:00 am-4:30 pm Sat-Sun 7:00 am-11:30 am

## 2017-11-10 LAB — BASIC METABOLIC PANEL
Anion gap: 13 (ref 5–15)
CHLORIDE: 102 mmol/L (ref 101–111)
CO2: 23 mmol/L (ref 22–32)
CREATININE: 0.49 mg/dL (ref 0.44–1.00)
Calcium: 8.7 mg/dL — ABNORMAL LOW (ref 8.9–10.3)
GFR calc non Af Amer: >60 mL/min (ref >60)
Glucose, Bld: 75 mg/dL (ref 65–99)
Potassium: 4 mmol/L (ref 3.5–5.1)
Sodium: 138 mmol/L (ref 135–145)

## 2017-11-10 LAB — CBC
HEMATOCRIT: 33.4 % — AB (ref 36.0–46.0)
HEMOGLOBIN: 11.7 g/dL — AB (ref 12.0–15.0)
MCH: 27.3 pg (ref 26.0–34.0)
MCHC: 35 g/dL (ref 30.0–36.0)
MCV: 78 fL (ref 78.0–100.0)
Platelets: 415 10*3/uL — ABNORMAL HIGH (ref 150–400)
RBC: 4.28 MIL/uL (ref 3.87–5.11)
RDW: 13.9 % (ref 11.5–15.5)
WBC: 10.3 10*3/uL (ref 4.0–10.5)

## 2017-11-10 LAB — CREATININE, FLUID (PLEURAL, PERITONEAL, JP DRAINAGE): Creat, Fluid: 0.5 mg/dL

## 2017-11-10 MED ORDER — HYDROCODONE-ACETAMINOPHEN 5-325 MG PO TABS: 1.0000 | ORAL_TABLET | ORAL | 0 refills | Status: DC | PRN

## 2017-11-10 MED ORDER — HYDROCODONE-ACETAMINOPHEN 5-325 MG PO TABS: 1.0000 | ORAL_TABLET | ORAL | Status: DC | PRN

## 2017-11-10 MED ORDER — AMOXICILLIN-POT CLAVULANATE 875-125 MG PO TABS: 1.0000 | ORAL_TABLET | Freq: Two times a day (BID) | ORAL | 0 refills | Status: AC

## 2017-11-10 NOTE — Progress Notes (Signed)
Pt demonstrated how to do drain care and emptying, discharge instructions given. Discharged to home accompanied by spouse.

## 2017-11-10 NOTE — Discharge Summary (Signed)
[facility] Discharge Summary   Patient ID: Beth Frost MRN: [id number] DOB/AGE: September 18, 1971 46 y.o.  Admit date: 11/04/2017 Discharge date: 11/10/2017  Admitting Diagnosis: Acute appendicitis   Discharge Diagnosis Patient Active Problem List   Diagnosis Date Noted  . Acute appendicitis 11/04/2017  . Essential hypertension 11/26/2016    Class: Family History of  . Herpes genitalis in women 07/18/2014    Class: History of    Consultants None  Imaging: CT abdomen pelvis w contrast 11/04/17: Positive for acute appendicitis, as described above. Small amount of free fluid in pelvis. No evidence of abscess or bowel obstruction.  Several small uterine fibroids incidentally noted.  Procedures Dr. Ninfa Linden (11/05/17) - Laparoscopic Appendectomy with drain placement    Hospital Course:  Beth Frost is a 46yo female who presented to [facility] 12/14 with 4 days of diffuse abdominal pain that localized to the RLQ.  Workup showed acute appendicitis on CT scan.  Patient was admitted and underwent procedure listed above.  Intraoperatively she was found to have a perforated appendix with abscess and a large phlegmon; the right fallopian tube was also inflamed secondary to the appendicitis. Tolerated procedure well and was transferred to the floor.  Patient had an ileus postoperatively as expected. Once this resolved diet was advanced as tolerated.  She was kept on IV zosyn during admission, and transitioned to augmentin at discharge. On POD5, the patient was voiding well, tolerating diet, ambulating well, pain well controlled, vital signs stable, incisions c/d/i and felt stable for discharge home. JP drain was left.   Patient will follow up in our office in 1-2 weeks and knows to call with questions or concerns.  Patient was discharged in good condition.  The [place] Substance controlled database was reviewed [place] prior to prescribing narcotic pain medication to this  patient.  Physical Exam: Gen: Alert, NAD, pleasant, cooperative Card: RRR, no M/G/R heard Pulm: CTA, no W/R/R, rate andeffort normal Abd: Soft,not distended,+BS, incisions with glue intact appear well healing,drain with minimal serousdrainage, moderate TTP in RLQ, drain site C/D/I Skin: no rashes noted, warm and dry   Allergies as of 11/10/2017   No Known Allergies     Medication List    TAKE these medications   albuterol 108 (90 Base) MCG/ACT inhaler Commonly known as:  PROVENTIL HFA;VENTOLIN HFA Inhale 2 puffs into the lungs every 6 (six) hours as needed for wheezing or shortness of breath.   amLODipine 5 MG tablet Commonly known as:  NORVASC TAKE 1 TABLET(5 MG) BY MOUTH DAILY   amoxicillin-clavulanate 875-125 MG tablet Commonly known as:  AUGMENTIN Take 1 tablet by mouth every 12 (twelve) hours for 7 days.   HYDROcodone-acetaminophen 5-325 MG tablet Commonly known as:  NORCO/VICODIN Take 1 tablet by mouth every 4 (four) hours as needed for moderate pain.   ibuprofen 200 MG tablet Commonly known as:  ADVIL,MOTRIN Take 200 mg by mouth as needed.   MULTIVITAMIN PO Take 1 tablet by mouth daily as needed (supplemental).   norethindrone 0.35 MG tablet Commonly known as:  MICRONOR,CAMILA,ERRIN Take 1 tablet (0.35 mg total) by mouth daily.   TYLENOL 325 MG tablet Generic drug:  acetaminophen Take 325 mg by mouth every 6 (six) hours as needed (pain).   valACYclovir 1000 MG tablet Commonly known as:  VALTREX Take 1 tablet (1,000 mg total) by mouth daily.        Follow-up [facility], [place]. Go on 11/18/2017.   Specialty:  General Surgery  Why:  appointment time is 11:00AM, please arrive 30 minutes prior to complete paperwork. Bring your photo ID and insurance card Contact information: [address] [place] [phone]          Signed: [facility] 11/10/2017, 11:28 AM Pager: [phone] Consults: [phone] Mon-Fri 7:00 am-4:30 pm Sat-Sun 7:00 am-11:30 am

## 2017-11-10 NOTE — Plan of Care (Signed)
  Education: Knowledge of General Education information will improve 11/10/2017 0450 - Progressing by Anson Fret, RN Note POC reviewed with pt.; pain management discussed- trying Tramadol.

## 2017-11-10 NOTE — Discharge Instructions (Signed)
Please arrive at least 30 min before your appointment to complete your check in paperwork.  If you are unable to arrive 30 min prior to your appointment time we may have to cancel or reschedule you.  LAPAROSCOPIC SURGERY: POST OP INSTRUCTIONS  1. DIET: Follow a light bland diet the first 24 hours after arrival home, such as soup, liquids, crackers, etc. Be sure to include lots of fluids daily. Avoid fast food or heavy meals as your are more likely to get nauseated. Eat a low fat the next few days after surgery.  2. Take your usually prescribed home medications unless otherwise directed. 3. PAIN CONTROL:  1. Pain is best controlled by a usual combination of three different methods TOGETHER:  1. Ice/Heat 2. Over the counter pain medication 3. Prescription pain medication 2. Most patients will experience some swelling and bruising around the incisions. Ice packs or heating pads (30-60 minutes up to 6 times a day) will help. Use ice for the first few days to help decrease swelling and bruising, then switch to heat to help relax tight/sore spots and speed recovery. Some people prefer to use ice alone, heat alone, alternating between ice & heat. Experiment to what works for you. Swelling and bruising can take several weeks to resolve.  3. It is helpful to take an over-the-counter pain medication regularly for the first few weeks. Choose one of the following that works best for you:  1. Naproxen (Aleve, etc) Two 220mg  tabs twice a day 2. Ibuprofen (Advil, etc) Three 200mg  tabs four times a day (every meal & bedtime) 3. Acetaminophen (Tylenol, etc) 500-650mg  four times a day (every meal & bedtime) 4. A prescription for pain medication (such as oxycodone, hydrocodone, etc) should be given to you upon discharge. Take your pain medication as prescribed. DO NOT TAKE any medications containing acetaminophen (tylenol) while taking the Norco 1. If you are having problems/concerns with the prescription medicine  (does not control pain, nausea, vomiting, rash, itching, etc), please call us [phone] to see if we need to switch you to a different pain medicine that will work better for you and/or control your side effect better. 2. If you need a refill on your pain medication, please contact your pharmacy. They will contact our office to request authorization. Prescriptions will not be filled after 5 pm or on week-ends. 4. Avoid getting constipated. Between the surgery and the pain medications, it is common to experience some constipation. Increasing fluid intake and taking a fiber supplement (such as Metamucil, Citrucel, FiberCon, MiraLax, etc) 1-2 times a day regularly will usually help prevent this problem from occurring. A mild laxative (prune juice, Milk of Magnesia, MiraLax, etc) should be taken according to package directions if there are no bowel movements after 48 hours.  5. Watch out for diarrhea. If you have many loose bowel movements, simplify your diet to bland foods & liquids for a few days. Stop any stool softeners and decrease your fiber supplement. Switching to mild anti-diarrheal medications (Kayopectate, Pepto Bismol) can help. If this worsens or does not improve, please call us. 6. Wash / shower every day. You may shower over the dressings as they are waterproof. Continue to shower over incision(s) after the dressing is off. 7. Remove your waterproof bandages 5 days after surgery. You may leave the incision open to air. You may replace a dressing/Band-Aid to cover the incision for comfort if you wish.  8. ACTIVITIES as tolerated:  1. You may resume regular (light) daily  activities beginning the next day--such as daily self-care, walking, climbing stairs--gradually increasing activities as tolerated. If you can walk 30 minutes without difficulty, it is safe to try more intense activity such as jogging, treadmill, bicycling, low-impact aerobics, swimming, etc. 2. Save the most intensive and  strenuous activity for last such as sit-ups, heavy lifting, contact sports, etc Refrain from any heavy lifting or straining until you are off narcotics for pain control.  3. DO NOT PUSH THROUGH PAIN. Let pain be your guide: If it hurts to do something, don't do it. Pain is your body warning you to avoid that activity for another week until the pain goes down. 4. You may drive when you are no longer taking prescription pain medication, you can comfortably wear a seatbelt, and you can safely maneuver your car and apply brakes. 5. You may have sexual intercourse when it is comfortable.  9. FOLLOW UP in our office  1. Please call [facility] at [phone] to set up an appointment to see your surgeon in the office for a follow-up appointment approximately 2-3 weeks after your surgery. 2. Make sure that you call for this appointment the day you arrive home to insure a convenient appointment time.      10. IF YOU HAVE DISABILITY OR FAMILY LEAVE FORMS, BRING THEM TO THE               OFFICE FOR PROCESSING.   WHEN TO CALL us [phone]:  1. Poor pain control 2. Reactions / problems with new medications (rash/itching, nausea, etc)  3. Fever over 101.5 F (38.5 C) 4. Inability to urinate 5. Nausea and/or vomiting 6. Worsening swelling or bruising 7. Continued bleeding from incision. 8. Increased pain, redness, or drainage from the incision  The clinic staff is available to answer your questions during regular business hours (8:30am-5pm). Please dont hesitate to call and ask to speak to one of our nurses for clinical concerns.  If you have a medical emergency, go to the nearest emergency room or call 911.  A surgeon from [facility] is always on call at the [facility], [place], [place], [place], [place] [address] ?  MAIN: [phone] ? TOLL FREE: [phone] ?  FAX [phone]  [email or web address]    [facility]  Care Surgical drains are used to remove extra fluid that normally builds up in a surgical wound after surgery. A surgical drain helps to heal a surgical wound. Different kinds of surgical drains include:  Active drains. These drains use suction to pull drainage away from the surgical wound. Drainage flows through a tube to a container outside of the body. It is important to keep the bulb or the drainage container flat (compressed) at all times, except while you empty it. Flattening the bulb or container creates suction. The two most common types of active drains are bulb drains and Hemovac drains.  Passive drains. These drains allow fluid to drain naturally, by gravity. Drainage flows through a tube to a bandage (dressing) or a container outside of the body. Passive drains do not need to be emptied. The most common type of passive drain is the Penrose drain.  A drain is placed during surgery. Immediately after surgery, drainage is usually bright red and a little thicker than water. The drainage may gradually turn yellow or pink and become thinner. It is likely that your health care provider will remove the drain when the drainage  stops or when the amount decreases to 1-2 Tbsp (15-30 mL) during a 24-hour period. How to care for your surgical drain  Keep the skin around the drain dry and covered with a dressing at all times.  Check your drain area every day for signs of infection. Check for: ? More redness, swelling, or pain. ? Pus or a bad smell. ? Cloudy drainage. Follow instructions from your health care provider about how to take care of your drain and how to change your dressing. Change your dressing at least one time every day. Change it more often if needed to keep the dressing dry. Make sure you: 1. Gather your supplies, including: ? Tape. ? Germ-free cleaning solution (sterile saline). ? Split gauze drain sponge: 4 x 4 inches (10 x 10 cm). ? Gauze square: 4 x 4 inches (10 x 10  cm). 2. Wash your hands with soap and water before you change your dressing. If soap and water are not available, use hand sanitizer. 3. Remove the old dressing. Avoid using scissors to do that. 4. Use sterile saline to clean your skin around the drain. 5. Place the tube through the slit in a drain sponge. Place the drain sponge so that it covers your wound. 6. Place the gauze square or another drain sponge on top of the drain sponge that is on the wound. Make sure the tube is between those layers. 7. Tape the dressing to your skin. 8. If you have an active bulb or Hemovac drain, tape the drainage tube to your skin 1-2 inches (2.5-5 cm) below the place where the tube enters your body. Taping keeps the tube from pulling on any stitches (sutures) that you have. 9. Wash your hands with soap and water. 10. Write down the color of your drainage and how often you change your dressing.  How to empty your active bulb or Hemovac drain 1. Make sure that you have a measuring cup that you can empty your drainage into. 2. Wash your hands with soap and water. If soap and water are not available, use hand sanitizer. 3. Gently move your fingers down the tube while squeezing very lightly. This is called stripping the tube. This clears any drainage, clots, or tissue from the tube. ? Do not pull on the tube. ? You may need to strip the tube several times every day to keep the tube clear. 4. Open the bulb cap or the drain plug. Do not touch the inside of the cap or the bottom of the plug. 5. Empty all of the drainage into the measuring cup. 6. Compress the bulb or the container and replace the cap or the plug. To compress the bulb or the container, squeeze it firmly in the middle while you close the cap or plug the container. 7. Write down the amount of drainage that you have in each 24-hour period. If you have less than 2 Tbsp (30 mL) of drainage during 24 hours, contact your health care provider. 8. Flush the  drainage down the toilet. 9. Wash your hands with soap and water. Contact a health care provider if:  You have more redness, swelling, or pain around your drain area.  The amount of drainage that you have is increasing instead of decreasing.  You have pus or a bad smell coming from your drain area.  You have a fever.  You have drainage that is cloudy.  There is a sudden stop or a sudden decrease in the amount of drainage  that you have.  Your tube falls out.  Your active draindoes not stay compressedafter you empty it. This information is not intended to replace advice given to you by your health care provider. Make sure you discuss any questions you have with your health care provider. Document Released: 11/06/2000 Document Revised: 04/16/2016 Document Reviewed: 05/29/2015 Elsevier Interactive Patient Education  2018 [employer].

## 2017-11-10 NOTE — Progress Notes (Signed)
[facility] Progress Note  5 Days Post-Op   Assessment/Plan Asthma - uses an inhaler at home HTN  Ruptured appendicitis - S/P lap appy and placement of drain, Dr. Ninfa Linden, 12/14 - WBC 9.8, afebrile - transition to PO abx and will send home on PO abx - drain with 120 cc output- will check creatinine - ileus appears to be resolving, pt had BM 12/17  KNL:ZJQB diet, PO K replacement VTE: SCD's, lovenox  HA:LPFXT 12/14-12/18, Augmentin 12/18>> Follow up:Dr. Ninfa Linden 2 weeks  DISPO:PO abx, continue drain, encourage ambulation, checking JP creatinine, likely home later today   LOS: 5 days    Subjective:  CC: abdominal soreness  Pain is mild. No nausea, vomiting, fever or chills. Tolerating diet. Last BM was 2 days ago. Still having flatus.   Objective: Vital signs in last 24 hours: Temp:  [97.7 F (36.5 C)-98.5 F (36.9 C)] 98.2 F (36.8 C) (12/19 0605) Pulse Rate:  [77-84] 82 (12/19 0605) Resp:  [18] 18 (12/19 0605) BP: (123-130)/(71-82) 129/82 (12/19 0605) SpO2:  [96 %-100 %] 100 % (12/19 0605) Last BM Date: 11/08/17  Intake/Output from previous day: 12/18 0701 - 12/19 0700 In: 1120 [P.O.:1120] Out: 520 [Urine:400; Drains:120] Intake/Output this shift: Total I/O In: -  Out: 450 [Urine:400; Drains:50]  PE: Gen:  Alert, NAD, pleasant, cooperative Card:  RRR, no M/G/R heard Pulm:  CTA, no W/R/R, rate and effort normal Abd: Soft, not distended, +BS, incisions with glue intact appear well healing, drain with minimal serous drainage, moderate TTP in RLQ, drain site C/D/I Skin: no rashes noted, warm and dry   Anti-infectives: Anti-infectives (From admission, onward)   Start     Dose/Rate Route Frequency Ordered Stop   11/09/17 1100  amoxicillin-clavulanate (AUGMENTIN) 875-125 MG per tablet 1 tablet     1 tablet Oral Every 12 hours 11/09/17 1025 11/16/17 0959   11/05/17 2200  cefTRIAXone (ROCEPHIN) 2 g in dextrose 5 % 50 mL IVPB  Status:   Discontinued     2 g 100 mL/hr over 30 Minutes Intravenous Every 24 hours 11/05/17 0127 11/05/17 1219   11/05/17 1400  piperacillin-tazobactam (ZOSYN) IVPB 3.375 g  Status:  Discontinued     3.375 g 12.5 mL/hr over 240 Minutes Intravenous Every 8 hours 11/05/17 1252 11/09/17 1025   11/05/17 0800  metroNIDAZOLE (FLAGYL) IVPB 500 mg  Status:  Discontinued     500 mg 100 mL/hr over 60 Minutes Intravenous Every 8 hours 11/05/17 0127 11/05/17 1219   11/04/17 2245  cefTRIAXone (ROCEPHIN) 2 g in dextrose 5 % 50 mL IVPB     2 g 100 mL/hr over 30 Minutes Intravenous  Once 11/04/17 2236 11/05/17 0012   11/04/17 2245  metroNIDAZOLE (FLAGYL) IVPB 500 mg     500 mg 100 mL/hr over 60 Minutes Intravenous  Once 11/04/17 2236 11/05/17 0212      Lab Results:  Recent Labs    11/09/17 0525 11/10/17 0415  WBC 9.8 10.3  HGB 11.5* 11.7*  HCT 32.9* 33.4*  PLT 393 415*   BMET Recent Labs    11/09/17 0525 11/09/17 1500 11/10/17 0415  NA 139  --  138  K 3.3*  --  4.0  CL 106  --  102  CO2 26  --  23  GLUCOSE 91  --  75  BUN <5*  --  <5*  CREATININE 0.42* 0.46 0.49  CALCIUM 8.3*  --  8.7*   PT/INR No results for input(s): LABPROT, INR in the  last 72 hours. CMP     Component Value Date/Time   NA 138 11/10/2017 0415   NA 140 11/04/2017 1544   K 4.0 11/10/2017 0415   CL 102 11/10/2017 0415   CO2 23 11/10/2017 0415   GLUCOSE 75 11/10/2017 0415   BUN <5 (L) 11/10/2017 0415   BUN 4 (L) 11/04/2017 1544   CREATININE 0.49 11/10/2017 0415   CREATININE 0.56 11/26/2016 1131   CALCIUM 8.7 (L) 11/10/2017 0415   PROT 6.3 (L) 11/05/2017 0545   PROT 6.6 11/04/2017 1544   ALBUMIN 2.8 (L) 11/05/2017 0545   ALBUMIN 3.9 11/04/2017 1544   AST 17 11/05/2017 0545   ALT 15 11/05/2017 0545   ALKPHOS 66 11/05/2017 0545   BILITOT 0.9 11/05/2017 0545   BILITOT 0.8 11/04/2017 1544   GFRNONAA >60 11/10/2017 0415   GFRNONAA >89 03/30/2016 1802   GFRAA >60 11/10/2017 0415   GFRAA >89 03/30/2016 1802    Lipase     Component Value Date/Time   LIPASE 18 05/11/2014 0219    Studies/Results: No results found.    Kalman Drape , [facility] 11/10/2017, 8:09 AM Pager: [phone] Consults: [phone] Mon-Fri 7:00 am-4:30 pm Sat-Sun 7:00 am-11:30 am

## 2017-11-27 ENCOUNTER — Other Ambulatory Visit: Payer: Self-pay | Admitting: Certified Nurse Midwife

## 2017-11-27 ENCOUNTER — Other Ambulatory Visit: Payer: Self-pay | Admitting: Family Medicine

## 2017-11-27 DIAGNOSIS — A6009 Herpesviral infection of other urogenital tract: Secondary | ICD-10-CM

## 2017-11-29 NOTE — Telephone Encounter (Signed)
Medication refill request: Valacyclovir Last AEX:  11/26/16 DL Next AEX: 12/01/17  Last MMG (if hormonal medication request): 09/07/16 BIRADS 1 negative/density Refill authorized: 11/26/16 #45 w/6 refills; today please advise, DL out office 11/29/17

## 2017-11-30 NOTE — Progress Notes (Signed)
47 y.o. [id number] Single  African American Fe here for annual exam. Periods monthly on Micronor. Recent surgery 12/14//18 for ruptured appendix with abscess. Still on leave from surgery due to lifting with job. Doing well, no issues at this point. Continues care with [facility] for aex/labs and hypertension management. All stable per patient. No partner change, no STD screening needed. No other health issues today.  No LMP recorded. Patient is not currently having periods (Reason: Oral contraceptives).          Sexually active: Yes.    The current method of family planning is OCP (estrogen/progesterone).    Exercising: No.  The patient does not participate in regular exercise at present. Smoker:  no  Health Maintenance: Pap:  10-26-14 neg, 12-10-16 neg HPV HR neg History of Abnormal Pap: yes, years ago per patient MMG:  November 2018 at [facility] -- will call for results Self Breast exams: yes Colonoscopy:  none BMD:   none TDaP:  2014 Shingles: no Pneumonia: no Hep C and HIV: HIV neg 2018 Labs: PCP   reports that  has never smoked. she has never used smokeless tobacco. She reports that she drinks alcohol. She reports that she does not use drugs.  Past Medical History:  Diagnosis Date  . Abnormal Pap smear    many yrs ago  . Asthma   . Depression   . Hypertension   . STD (sexually transmitted disease)    HSV2    Past Surgical History:  Procedure Laterality Date  . CHOLECYSTECTOMY N/A 05/11/2014   Procedure: LAPAROSCOPIC CHOLECYSTECTOMY WITH INTRAOPERATIVE CHOLANGIOGRAM;  Surgeon: Zenovia Jarred, MD;  Location: [facility];  Service: General;  Laterality: N/A;  . CHOLECYSTECTOMY  04/2014  . COLPOSCOPY    . CRYOTHERAPY     for abnormal pap  . DILATION AND CURETTAGE OF UTERUS    . LAPAROSCOPIC APPENDECTOMY N/A 11/05/2017   Procedure: APPENDECTOMY LAPAROSCOPIC;  Surgeon: Coralie Keens, MD;  Location: [facility];  Service: General;  Laterality: N/A;    Current Outpatient  Medications  Medication Sig Dispense Refill  . acetaminophen (TYLENOL) 325 MG tablet Take 325 mg by mouth every 6 (six) hours as needed (pain).     Marland Kitchen albuterol (PROVENTIL HFA;VENTOLIN HFA) 108 (90 Base) MCG/ACT inhaler Inhale 2 puffs into the lungs every 6 (six) hours as needed for wheezing or shortness of breath. 1 Inhaler 0  . amLODipine (NORVASC) 5 MG tablet TAKE 1 TABLET BY MOUTH EVERY DAY 90 tablet 0  . HYDROcodone-acetaminophen (NORCO/VICODIN) 5-325 MG tablet Take 1 tablet by mouth every 4 (four) hours as needed for moderate pain. 10 tablet 0  . ibuprofen (ADVIL,MOTRIN) 200 MG tablet Take 200 mg by mouth as needed.    . Multiple Vitamins-Minerals (MULTIVITAMIN PO) Take 1 tablet by mouth daily as needed (supplemental).     . norethindrone (MICRONOR,CAMILA,ERRIN) 0.35 MG tablet Take 1 tablet (0.35 mg total) by mouth daily. 3 Package 4  . valACYclovir (VALTREX) 1000 MG tablet TAKE 1 TABLET(1000 MG) BY MOUTH DAILY 45 tablet 0   No current facility-administered medications for this visit.     Family History  Problem Relation Age of Onset  . Diabetes Mother   . Cancer Mother        leukemia  . Lupus Mother   . Hypertension Mother   . Hypertension Father   . Breast cancer Maternal Aunt     ROS:  Pertinent items are noted in HPI.  Otherwise, a comprehensive ROS was negative.  Exam:   BP 118/72 (BP Location: Right Arm, Patient Position: Sitting, Cuff Size: Normal)   Pulse 76   Resp 16   Ht 5' 1.5" (1.562 m)   Wt 170 lb (77.1 kg)   BMI 31.60 kg/m  Height: 5' 1.5" (156.2 cm) Ht Readings from Last 3 Encounters:  12/01/17 5' 1.5" (1.562 m)  11/05/17 5' 1.73" (1.568 m)  11/04/17 5' 1.75" (1.568 m)    General appearance: alert, cooperative and appears stated age Head: Normocephalic, without obvious abnormality, atraumatic Neck: no adenopathy, supple, symmetrical, trachea midline and thyroid normal to inspection and palpation Lungs: clear to auscultation bilaterally Breasts: normal  appearance, no masses or tenderness, No nipple retraction or dimpling, No nipple discharge or bleeding, No axillary or supraclavicular adenopathy Heart: regular rate and rhythm Abdomen: soft, non-tender; no masses,  no organomegaly Extremities: extremities normal, atraumatic, no cyanosis or edema Skin: Skin color, texture, turgor normal. No rashes or lesions Lymph nodes: Cervical, supraclavicular, and axillary nodes normal. No abnormal inguinal nodes palpated Neurologic: Grossly normal   Pelvic: External genitalia:  no lesions              Urethra:  normal appearing urethra with no masses, tenderness or lesions              Bartholin's and Skene's: normal                 Vagina: normal appearing vagina with normal color and discharge, no lesions              Cervix: multiparous appearance, no cervical motion tenderness and no lesions              Pap taken: No. Bimanual Exam:  Uterus:  normal size, contour, position, consistency, mobility, non-tender              Adnexa: normal adnexa and no mass, fullness, tenderness               Rectovaginal: Confirms               Anus:  normal sphincter tone, no lesions  Chaperone present: yes  A:  Well Woman with normal exam  Contraception POP due to hypertension  History of HSV needs Rx update  Recent appendectomy due rupture and abscess, still under surgical follow up  Hypertension stable with PCP management    P:   Reviewed health and wellness pertinent to exam  Risks/benefits/warning signs with POP discussed, also discussed IUD if she desires change, will advise, requests Rx  Rx Micronor see order with instructions  Rx Valtrex see order with instructions  Continue follow up with MD as indicated  Pap smear: no   counseled on breast self exam, mammography screening, STD prevention, HIV risk factors and prevention, adequate intake of calcium and vitamin D, diet and exercise  return annually or prn  An After Visit Summary was printed  and given to the patient.

## 2017-12-01 ENCOUNTER — Encounter: Payer: Self-pay | Admitting: Certified Nurse Midwife

## 2017-12-01 ENCOUNTER — Ambulatory Visit (INDEPENDENT_AMBULATORY_CARE_PROVIDER_SITE_OTHER): Payer: PRIVATE HEALTH INSURANCE | Admitting: Certified Nurse Midwife

## 2017-12-01 ENCOUNTER — Other Ambulatory Visit: Payer: Self-pay

## 2017-12-01 VITALS — BP 118/72 | HR 76 | Resp 16 | Ht 61.5 in | Wt 170.0 lb

## 2017-12-01 DIAGNOSIS — Z30011 Encounter for initial prescription of contraceptive pills: Secondary | ICD-10-CM | POA: Diagnosis not present

## 2017-12-01 DIAGNOSIS — A6009 Herpesviral infection of other urogenital tract: Secondary | ICD-10-CM

## 2017-12-01 DIAGNOSIS — Z01419 Encounter for gynecological examination (general) (routine) without abnormal findings: Secondary | ICD-10-CM | POA: Diagnosis not present

## 2017-12-01 MED ORDER — NORETHINDRONE 0.35 MG PO TABS: 1.0000 | ORAL_TABLET | Freq: Every day | ORAL | 4 refills | Status: DC

## 2017-12-01 MED ORDER — VALACYCLOVIR HCL 1 G PO TABS: ORAL_TABLET | ORAL | 12 refills | Status: DC

## 2017-12-01 NOTE — Patient Instructions (Signed)

## 2018-01-04 ENCOUNTER — Other Ambulatory Visit: Payer: Self-pay | Admitting: Certified Nurse Midwife

## 2018-01-04 DIAGNOSIS — Z30011 Encounter for initial prescription of contraceptive pills: Secondary | ICD-10-CM

## 2018-03-16 ENCOUNTER — Ambulatory Visit: Payer: PRIVATE HEALTH INSURANCE | Admitting: Certified Nurse Midwife

## 2018-03-16 ENCOUNTER — Other Ambulatory Visit: Payer: Self-pay

## 2018-03-16 ENCOUNTER — Encounter: Payer: Self-pay | Admitting: Certified Nurse Midwife

## 2018-03-16 ENCOUNTER — Telehealth: Payer: Self-pay | Admitting: Certified Nurse Midwife

## 2018-03-16 VITALS — BP 110/70 | HR 68 | Resp 16 | Ht 61.5 in | Wt 170.0 lb

## 2018-03-16 DIAGNOSIS — N6320 Unspecified lump in the left breast, unspecified quadrant: Secondary | ICD-10-CM | POA: Diagnosis not present

## 2018-03-16 NOTE — Progress Notes (Signed)
Scheduled patient for Lt.Breast Diag.w/Lt.Breast U/S 03-16-18 3:15 at [facility]. Patient took order with her to [place].

## 2018-03-16 NOTE — Patient Instructions (Signed)
Breast Self-Awareness Breast self-awareness means:  Knowing how your breasts look.  Knowing how your breasts feel.  Checking your breasts every month for changes.  Telling your doctor if you notice a change in your breasts.  Breast self-awareness allows you to notice a breast problem early while it is still small. How to do a breast self-exam One way to learn what is normal for your breasts and to check for changes is to do a breast self-exam. To do a breast self-exam: Look for Changes  1. Take off all the clothes above your waist. 2. Stand in front of a mirror in a room with good lighting. 3. Put your hands on your hips. 4. Push your hands down. 5. Look at your breasts and nipples in the mirror to see if one breast or nipple looks different than the other. Check to see if: ? The shape of one breast is different. ? The size of one breast is different. ? There are wrinkles, dips, and bumps in one breast and not the other. 6. Look at each breast for changes in your skin, such as: ? Redness. ? Scaly areas. 7. Look for changes in your nipples, such as: ? Liquid around the nipples. ? Bleeding. ? Dimpling. ? Redness. ? A change in where the nipples are. Feel for Changes 1. Lie on your back on the floor. 2. Feel each breast. To do this, follow these steps: ? Pick a breast to feel. ? Put the arm closest to that breast above your head. ? Use your other arm to feel the nipple area of your breast. Feel the area with the pads of your three middle fingers by making small circles with your fingers. For the first circle, press lightly. For the second circle, press harder. For the third circle, press even harder. ? Keep making circles with your fingers at the light, harder, and even harder pressures as you move down your breast. Stop when you feel your ribs. ? Move your fingers a little toward the center of your body. ? Start making circles with your fingers again, this time going up until  you reach your collarbone. ? Keep making up and down circles until you reach your armpit. Remember to keep using the three pressures. ? Feel the other breast in the same way. 3. Sit or stand in the shower or tub. 4. With soapy water on your skin, feel each breast the same way you did in step 2, when you were lying on the floor. Write Down What You Find  After doing the self-exam, write down:  What is normal for each breast.  Any changes you find in each breast.  When you last had your period.  How often should I check my breasts? Check your breasts every month. If you are breastfeeding, the best time to check them is after you feed your baby or after you use a breast pump. If you get periods, the best time to check your breasts is 5-7 days after your period is over. When should I see my doctor? See your doctor if you notice:  A change in shape or size of your breasts or nipples.  A change in the skin of your breast or nipples, such as red or scaly skin.  Unusual fluid coming from your nipples.  A lump or thick area that was not there before.  Pain in your breasts.  Anything that concerns you.  This information is not intended to replace advice given to   you by your health care provider. Make sure you discuss any questions you have with your health care provider. Document Released: 04/27/2008 Document Revised: 04/16/2016 Document Reviewed: 09/29/2015 Elsevier Interactive Patient Education  2018 Elsevier Inc.  

## 2018-03-16 NOTE — Progress Notes (Signed)
   Subjective:   47 y.o. Single African American female presents for evaluation of left breast mass. Noted breast mass while doing SBE shower and noted mass, non tender.  Patient sought evaluation because of breast finding.  Contributing factors include family hx on mother's side. Denies any other problems today. Takes Micronor for contraception.. Patient denies history of trauma, bites, or injuries. Last mammogram was 1/19 and was normal per patient. Previous evaluation has included none.   Review of Systems Pertinent items are noted in HPI.   Objective:   General appearance: alert, cooperative, appears stated age and no distress Breasts: Normal appearance, no masses, tenderness, axillary enlargement or nipple discharge noted in right breast.  Left breast: normal appearance, non tender, no nipple discharge, small pea size mass noted at 8 o'clock with 1-2 Fb from outer edge of breast, non tender, mobile. Breast edge: fibroglandular feel.     Assessment:   ASSESSMENT:Patient is diagnosed with left breast mass   Plan:   PLAN: Discussed finding and need to evaluate with [place] and diagnostic mammogram, patient agreeable. Questions addressed. Will be scheduled prior to leaving.

## 2018-03-16 NOTE — Telephone Encounter (Signed)
Patient has a left breast lump and also wants to discuss changing one of her medications.

## 2018-03-16 NOTE — Telephone Encounter (Signed)
Spoke with patient. Patient states that she found a lump in her left breast and would like to have it checked. Denies any swelling or pain in the breast. Also would like to discuss changing one of her medications. Appointment scheduled for today at 12:45 pm with Melvia Heaps CNM. Patient is agreeable to date and time.   Routing to provider for final review. Patient agreeable to disposition. Will close encounter.

## 2018-03-18 ENCOUNTER — Telehealth: Payer: Self-pay | Admitting: *Deleted

## 2018-03-18 NOTE — Telephone Encounter (Signed)
Spoke with patient, reviewed [facility]G results dated 03/16/18 per Melvia Heaps, CNM. 2 wk recheck scheduled for 5/14 at 4pm with Melvia Heaps, CNM.   MMG hold  Routing to provider for final review. Patient is agreeable to disposition. Will close encounter.

## 2018-03-26 ENCOUNTER — Other Ambulatory Visit: Payer: Self-pay | Admitting: Family Medicine

## 2018-03-28 ENCOUNTER — Encounter: Payer: Self-pay | Admitting: Certified Nurse Midwife

## 2018-03-28 NOTE — Telephone Encounter (Signed)
Norvasc 5 mg refill request  LOV 11/27/16 with Dr. Nolon Rod where this was address.     Needs appt.  [place], [place].

## 2018-04-05 ENCOUNTER — Encounter: Payer: Self-pay | Admitting: Certified Nurse Midwife

## 2018-04-05 ENCOUNTER — Ambulatory Visit (INDEPENDENT_AMBULATORY_CARE_PROVIDER_SITE_OTHER): Payer: PRIVATE HEALTH INSURANCE | Admitting: Certified Nurse Midwife

## 2018-04-05 VITALS — BP 104/62 | HR 68 | Resp 16 | Ht 61.5 in | Wt 165.0 lb

## 2018-04-05 DIAGNOSIS — Z0000 Encounter for general adult medical examination without abnormal findings: Secondary | ICD-10-CM | POA: Diagnosis not present

## 2018-04-05 NOTE — Progress Notes (Signed)
   Subjective:   47 y.o. SingleAfrican American female presents for follow up of left breast mass noted to be fatty tissue on [place] and Diagnostic mammogram (see report). Patient denies pain or breast mass. Review of Systems Pertinent to HPI   Objective:   General appearance: alert, cooperative, appears stated age and no distress   Breasts: normal appearance, no masses or tenderness, No nipple retraction or dimpling, No nipple discharge or bleeding, No axillary or supraclavicular adenopathy, area of concern noted to feel fatty in relationship to other tissue and no distinction noted today. patient palpated breast so she could watch for changes   Assessment:   ASSESSMENT:Patient is diagnosed with normal breast exam with fatty changes in breast bilateral  Plan:   Plan: Discussed finding with patient and importance of continued SBE and notify if change. Patient comfortable with this after discussed and seeing visual of the description of breast. Questions addressed. Have routine screening in 7 months.  rv prn

## 2018-05-18 ENCOUNTER — Other Ambulatory Visit: Payer: Self-pay | Admitting: Family Medicine

## 2018-05-19 ENCOUNTER — Other Ambulatory Visit: Payer: Self-pay | Admitting: Family Medicine

## 2018-05-19 NOTE — Telephone Encounter (Signed)
Refill request for amlodipine 5 mg denied.  Pt needs ov for f/u htn/med refill.  Will send pt scheduling pool to call pt and schedule f/u appt for htn/med refill with [facility]. Dgaddy, CMA

## 2018-05-19 NOTE — Telephone Encounter (Signed)
Refill request for amlodipine 5 mg tab denied.  Pt needs ov no refill with schedule and kept appt.  Will send to schedulers to call pt and schedule with [facility] for f/u htn and medication refills. Dgaddy, CMA

## 2018-05-19 NOTE — Telephone Encounter (Signed)
Pt has appt on 7/8. Need refill on bp meds. The rx at the pharmacy is expired. They are sending a request for another. Pt will need more meds before the appt.

## 2018-05-19 NOTE — Telephone Encounter (Signed)
Called pt and scheduled  appt for July the 8th with [facility]. Anitra called in 2 week suppy for BP med because pt was completely out . FR

## 2018-05-20 NOTE — Telephone Encounter (Signed)
Pt has an appt for 05/30/18 for a med refill . Thanks!

## 2018-05-25 ENCOUNTER — Telehealth: Payer: Self-pay

## 2018-05-25 ENCOUNTER — Other Ambulatory Visit: Payer: Self-pay

## 2018-05-25 MED ORDER — AMLODIPINE BESYLATE 5 MG PO TABS: 5.0000 mg | ORAL_TABLET | Freq: Every day | ORAL | 0 refills | Status: DC

## 2018-05-25 NOTE — Telephone Encounter (Signed)
Dawn from [facility] calling to advise pt calling for bp med she requested on 05/18/18 and 05/19/18.  Pt has appt with [facility] on Monday 05/30/18.  Advised Dawn to convey to pt to make sure she keeps appt on Monday and I will call in 30 day supply of amlodipine 5 mg.  Refill sent to [facility] on [address] and [address]. Dgaddy, CMA

## 2018-05-30 ENCOUNTER — Encounter: Payer: Self-pay | Admitting: Family Medicine

## 2018-05-30 ENCOUNTER — Other Ambulatory Visit: Payer: Self-pay

## 2018-05-30 ENCOUNTER — Ambulatory Visit: Payer: PRIVATE HEALTH INSURANCE | Admitting: Family Medicine

## 2018-05-30 VITALS — BP 145/83 | HR 84 | Temp 98.8°F | Resp 17 | Ht 61.5 in | Wt 157.6 lb

## 2018-05-30 DIAGNOSIS — E663 Overweight: Secondary | ICD-10-CM | POA: Diagnosis not present

## 2018-05-30 DIAGNOSIS — I10 Essential (primary) hypertension: Secondary | ICD-10-CM

## 2018-05-30 DIAGNOSIS — J4520 Mild intermittent asthma, uncomplicated: Secondary | ICD-10-CM | POA: Diagnosis not present

## 2018-05-30 MED ORDER — AMLODIPINE BESYLATE 5 MG PO TABS: 5.0000 mg | ORAL_TABLET | Freq: Every day | ORAL | 0 refills | Status: DC

## 2018-05-30 MED ORDER — ALBUTEROL SULFATE HFA 108 (90 BASE) MCG/ACT IN AERS: 2.0000 | INHALATION_SPRAY | Freq: Four times a day (QID) | RESPIRATORY_TRACT | 0 refills | Status: DC | PRN

## 2018-05-30 NOTE — Patient Instructions (Addendum)
   IF you received an x-ray today, you will receive an invoice from [facility]. Please contact [facility] at [phone] with questions or concerns regarding your invoice.   IF you received labwork today, you will receive an invoice from [facility]. Please contact [facility] at [phone] with questions or concerns regarding your invoice.   Our billing staff will not be able to assist you with questions regarding bills from these companies.  You will be contacted with the lab results as soon as they are available. The fastest way to get your results is to activate your My Chart account. Instructions are located on the last page of this paperwork. If you have not heard from us regarding the results in 2 weeks, please contact this office.     Heart Disease Prevention Heart disease is a leading cause of death. There are many things you can do to help prevent heart disease. Be physically active Physical activity is good for your heart. It helps control your blood pressure, cholesterol levels, and weight. Try to be physically active every day. Ask your health care provider what activities are best for you. Be a healthy weight Extra weight can strain your heart and affect your blood pressure and cholesterol levels. Lose weight with diet and exercise if recommended by your health care provider. Eat heart-healthy foods Follow a healthy eating plan as recommended by your health care provider or dietitian. Heart-healthy foods include:  High-fiber foods. These include oat bran, oatmeal, and whole-grain breads and cereals.  Fruits and vegetables.  Avoid:  Alcohol.  Fried foods.  Foods high in saturated fat. These include meats, butter, whole dairy products, shortening, and coconut or palm oil.  Salty foods. These include canned food, luncheon meat, salty snacks, and fast food.  Keep your cholesterol levels under control Cholesterol is a substance that is used for many  important functions. When your cholesterol levels are high, cholesterol can stick to the insides of your blood vessels, making them narrow or clog. This can lead to chest pain (angina) and a heart attack. Keep your cholesterol levels under control as recommended by your health care provider. Have your cholesterol checked at least once a year. Target cholesterol levels (in mg/dL) for most people are:  Total cholesterol below 200.  LDL cholesterol below 100.  HDL cholesterol above 40 in men and above 50 in women.  Triglycerides below 150.  Keep your blood pressure under control Having high blood pressure (hypertension) puts you at risk for stroke and other forms of heart disease. Keep your blood pressure under control as recommended by your health care provider. Ask your health care provider if you need treatment to lower your blood pressure. If you are 18-39 years of age, have your blood pressure checked every 3-5 years. If you are 40 years of age or older, have your blood pressure checked every year. Do not use tobacco products Tobacco smoke can damage your heart and blood vessels. Do not use any tobacco products including cigarettes, chewing tobacco, or electronic cigarettes. If you need help quitting, ask your health care provider. Take medicines as directed Take medicines only as directed by your health care provider. Ask your health care provider whether you should take an aspirin every day. Taking aspirin can help reduce your risk of heart disease and stroke. Where to find more information: To find out more about heart disease, visit the American Heart Association's website at www.americanheart.org This information is not intended to replace advice given to   you by your health care provider. Make sure you discuss any questions you have with your health care provider. Document Released: 06/23/2004 Document Revised: 04/08/2016 Document Reviewed: 01/03/2014 Elsevier Interactive Patient  Education  [employer].

## 2018-05-30 NOTE — Progress Notes (Signed)
Chief Complaint  Patient presents with  . Medication Refill    amlodipine and albuterol    HPI  Hypertension: Patient here for follow-up of elevated blood pressure. She is adherent to low salt diet.  Blood pressure is well controlled at home. Cardiac symptoms none. Patient denies chest pain, chest pressure/discomfort, claudication, dyspnea, exertional chest pressure/discomfort, fatigue and irregular heart beat.  Cardiovascular risk factors: hypertension. Use of agents associated with hypertension: none. History of target organ damage: none.  BP Readings from Last 3 Encounters:  05/30/18 (!) 145/83  04/05/18 104/62  03/16/18 110/70   No side effects of the food Gets dizzy every once in a while She did not take her medication last night  Asthma She has asthma Her asthma is typically flaring due to heat and humidity She uses her inhaler weekly during the summer She is not susing it multiple times a day unless she is outside   Overweight Wt Readings from Last 3 Encounters:  05/30/18 157 lb 9.6 oz (71.5 kg)  04/05/18 165 lb (74.8 kg)  03/16/18 170 lb (77.1 kg)   Pt reports that she is working on losing weight by changing her foods She is exercising She denies fatigue, dizziness, polyuria or polydipsia   Past Medical History:  Diagnosis Date  . Abnormal Pap smear    many yrs ago  . Asthma   . Depression   . Hypertension   . STD (sexually transmitted disease)    HSV2    Current Outpatient Medications  Medication Sig Dispense Refill  . acetaminophen (TYLENOL) 325 MG tablet Take 325 mg by mouth every 6 (six) hours as needed (pain).     Marland Kitchen albuterol (PROVENTIL HFA;VENTOLIN HFA) 108 (90 Base) MCG/ACT inhaler Inhale 2 puffs into the lungs every 6 (six) hours as needed for wheezing or shortness of breath. 1 Inhaler 0  . amLODipine (NORVASC) 5 MG tablet Take 1 tablet (5 mg total) by mouth daily. 30 tablet 0  . ibuprofen (ADVIL,MOTRIN) 200 MG tablet Take 200 mg by mouth as  needed.    . norethindrone (MICRONOR,CAMILA,ERRIN) 0.35 MG tablet Take 1 tablet (0.35 mg total) by mouth daily. 3 Package 4  . valACYclovir (VALTREX) 1000 MG tablet Take one daily at onset of outbreak 45 tablet 12  . Multiple Vitamins-Minerals (MULTIVITAMIN PO) Take 1 tablet by mouth daily as needed (supplemental).     . phentermine (ADIPEX-P) 37.5 MG tablet Take 37.5 mg by mouth daily.  0   No current facility-administered medications for this visit.     Allergies: No Known Allergies  Past Surgical History:  Procedure Laterality Date  . CHOLECYSTECTOMY N/A 05/11/2014   Procedure: LAPAROSCOPIC CHOLECYSTECTOMY WITH INTRAOPERATIVE CHOLANGIOGRAM;  Surgeon: Zenovia Jarred, MD;  Location: [facility];  Service: General;  Laterality: N/A;  . CHOLECYSTECTOMY  04/2014  . COLPOSCOPY    . CRYOTHERAPY     for abnormal pap  . DILATION AND CURETTAGE OF UTERUS    . LAPAROSCOPIC APPENDECTOMY N/A 11/05/2017   Procedure: APPENDECTOMY LAPAROSCOPIC;  Surgeon: Coralie Keens, MD;  Location: [place];  Service: General;  Laterality: N/A;    Social History   Socioeconomic History  . Marital status: Single    Spouse name: Not on file  . Number of children: Not on file  . Years of education: Not on file  . Highest education level: Not on file  Occupational History  . Not on file  Social Needs  . Financial resource strain: Not on file  . Food  insecurity:    Worry: Not on file    Inability: Not on file  . Transportation needs:    Medical: Not on file    Non-medical: Not on file  Tobacco Use  . Smoking status: Never Smoker  . Smokeless tobacco: Never Used  Substance and Sexual Activity  . Alcohol use: Yes    Comment: 3-4 times a week (wine)  . Drug use: No  . Sexual activity: Yes    Partners: Male    Birth control/protection: Pill  Lifestyle  . Physical activity:    Days per week: Not on file    Minutes per session: Not on file  . Stress: Not on file  Relationships  . Social connections:     Talks on phone: Not on file    Gets together: Not on file    Attends religious service: Not on file    Active member of club or organization: Not on file    Attends meetings of clubs or organizations: Not on file    Relationship status: Not on file  Other Topics Concern  . Not on file  Social History Narrative  . Not on file    Family History  Problem Relation Age of Onset  . Diabetes Mother   . Cancer Mother        leukemia  . Lupus Mother   . Hypertension Mother   . Hypertension Father   . Breast cancer Maternal Aunt      ROS Review of Systems See HPI Constitution: No fevers or chills No malaise No diaphoresis Skin: No rash or itching Eyes: no blurry vision, no double vision GU: no dysuria or hematuria Neuro: no dizziness or headaches  all others reviewed and negative   Objective: Vitals:   05/30/18 1216  BP: (!) 145/83  Pulse: 84  Resp: 17  Temp: 98.8 F (37.1 C)  TempSrc: Oral  SpO2: 100%  Weight: 157 lb 9.6 oz (71.5 kg)  Height: 5' 1.5" (1.562 m)   Body mass index is 29.3 kg/m.  Physical Exam Physical Exam  Constitutional: She is oriented to person, place, and time. She appears well-developed and well-nourished.  HENT:  Head: Normocephalic and atraumatic.  Eyes: Conjunctivae and EOM are normal.  Cardiovascular: Normal rate, regular rhythm and normal heart sounds.   Pulmonary/Chest: Effort normal and breath sounds normal. No respiratory distress. She has no wheezes.  Neurological: She is alert and oriented to person, place, and time.    Assessment and Plan [facility] was seen today for medication refill.  Diagnoses and all orders for this visit:  Essential hypertension- refilled amlodipine, checked monitoring labs Discussed DASH diet Continue weight loss -     amLODipine (NORVASC) 5 MG tablet; Take 1 tablet (5 mg total) by mouth daily. -     Basic metabolic panel -     Lipid panel  Mild intermittent asthma without complication- stable,  avoid triggers, advised flu shot -     albuterol (PROVENTIL HFA;VENTOLIN HFA) 108 (90 Base) MCG/ACT inhaler; Inhale 2 puffs into the lungs every 6 (six) hours as needed for wheezing or shortness of breath.  Overweight -  Improved from obese bmi to overweight bmi    Trellis Guirguis A Nolon Rod

## 2018-05-31 ENCOUNTER — Encounter: Payer: Self-pay | Admitting: Family Medicine

## 2018-05-31 LAB — BASIC METABOLIC PANEL
BUN / CREAT RATIO: 14 (ref 9–23)
BUN: 8 mg/dL (ref 6–24)
CALCIUM: 9.2 mg/dL (ref 8.7–10.2)
CHLORIDE: 103 mmol/L (ref 96–106)
CO2: 24 mmol/L (ref 20–29)
Creatinine, Ser: 0.59 mg/dL (ref 0.57–1.00)
GFR, EST AFRICAN AMERICAN: 129 mL/min/{1.73_m2} (ref >59)
GFR, EST NON AFRICAN AMERICAN: 112 mL/min/{1.73_m2} (ref >59)
Glucose: 73 mg/dL (ref 65–99)
POTASSIUM: 5.5 mmol/L — AB (ref 3.5–5.2)
Sodium: 142 mmol/L (ref 134–144)

## 2018-05-31 LAB — LIPID PANEL
Chol/HDL Ratio: 2.6 ratio (ref 0.0–4.4)
Cholesterol, Total: 167 mg/dL (ref 100–199)
HDL: 64 mg/dL (ref >39)
LDL Calculated: 96 mg/dL (ref 0–99)
Triglycerides: 34 mg/dL (ref 0–149)
VLDL Cholesterol Cal: 7 mg/dL (ref 5–40)

## 2018-06-14 ENCOUNTER — Encounter: Payer: Self-pay | Admitting: Certified Nurse Midwife

## 2018-07-26 ENCOUNTER — Encounter: Payer: Self-pay | Admitting: Family Medicine

## 2018-08-15 ENCOUNTER — Other Ambulatory Visit: Payer: Self-pay | Admitting: Family Medicine

## 2018-08-15 DIAGNOSIS — I10 Essential (primary) hypertension: Secondary | ICD-10-CM

## 2018-08-15 NOTE — Telephone Encounter (Signed)
Amlodipine 5 mg refill Last Refill:05/30/18 # 30 Last OV: 05/30/18 PCP: [place]: [facility] # [phone]

## 2018-09-14 ENCOUNTER — Other Ambulatory Visit: Payer: Self-pay | Admitting: Family Medicine

## 2018-09-14 DIAGNOSIS — I10 Essential (primary) hypertension: Secondary | ICD-10-CM

## 2018-12-09 ENCOUNTER — Encounter: Payer: Self-pay | Admitting: Certified Nurse Midwife

## 2018-12-09 ENCOUNTER — Other Ambulatory Visit (HOSPITAL_COMMUNITY)
Admission: RE | Admit: 2018-12-09 | Discharge: 2018-12-09 | Disposition: A | Discharge status: Home / Self Care | Payer: PRIVATE HEALTH INSURANCE | Source: Ambulatory Visit | Attending: Certified Nurse Midwife | Admitting: Certified Nurse Midwife

## 2018-12-09 ENCOUNTER — Other Ambulatory Visit: Payer: Self-pay

## 2018-12-09 ENCOUNTER — Ambulatory Visit (INDEPENDENT_AMBULATORY_CARE_PROVIDER_SITE_OTHER): Payer: PRIVATE HEALTH INSURANCE | Admitting: Certified Nurse Midwife

## 2018-12-09 VITALS — BP 110/78 | HR 60 | Resp 16 | Ht 61.75 in | Wt 175.0 lb

## 2018-12-09 DIAGNOSIS — Z113 Encounter for screening for infections with a predominantly sexual mode of transmission: Secondary | ICD-10-CM

## 2018-12-09 DIAGNOSIS — Z124 Encounter for screening for malignant neoplasm of cervix: Secondary | ICD-10-CM | POA: Insufficient documentation

## 2018-12-09 DIAGNOSIS — Z01419 Encounter for gynecological examination (general) (routine) without abnormal findings: Secondary | ICD-10-CM | POA: Diagnosis not present

## 2018-12-09 DIAGNOSIS — A6009 Herpesviral infection of other urogenital tract: Secondary | ICD-10-CM

## 2018-12-09 DIAGNOSIS — Z1211 Encounter for screening for malignant neoplasm of colon: Secondary | ICD-10-CM

## 2018-12-09 DIAGNOSIS — Z3041 Encounter for surveillance of contraceptive pills: Secondary | ICD-10-CM

## 2018-12-09 MED ORDER — VALACYCLOVIR HCL 1 G PO TABS: ORAL_TABLET | ORAL | 12 refills | Status: DC

## 2018-12-09 MED ORDER — NORETHINDRONE 0.35 MG PO TABS: 1.0000 | ORAL_TABLET | Freq: Every day | ORAL | 4 refills | Status: DC

## 2018-12-09 NOTE — Patient Instructions (Signed)

## 2018-12-09 NOTE — Progress Notes (Signed)
48 y.o. [id number] Single  African American Fe here for annual exam. Periods normal, no issues or changes. Contraception POP working well. No partner change, STD screening.  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 Please update RX. Sees PCP Dr. Bridget Hartshorn. Continues to see PCP for hypertension management. Had labs at last visit, all stable. No other health issues today. Spent time with grandchildren over holidays!  Patient's last menstrual period was 11/13/2018.          Sexually active: Yes.    The current method of family planning is POP.    Exercising: No.  exercise Smoker:  no  Review of Systems  Constitutional: Negative.   HENT: Negative.   Eyes: Negative.   Respiratory: Negative.   Cardiovascular: Negative.   Gastrointestinal: Negative.   Genitourinary: Negative.   Musculoskeletal: Negative.   Skin: Negative.   Neurological: Negative.   Endo/Heme/Allergies: Negative.   Psychiatric/Behavioral: Negative.     Health Maintenance: Pap:  11-26-16 neg HPV HR neg History of Abnormal Pap: yrs ago MMG:  09-30-18 Category a density birads 1:neg Self Breast exams: occ Colonoscopy:  none BMD:   none TDaP:  2014 Shingles: no Pneumonia: no Hep C and HIV: HIV neg 2018 Labs: if needed   reports that she has never smoked. She has never used smokeless tobacco. She reports current alcohol use of about 3.0 standard drinks of alcohol per week. She reports that she does not use drugs.  Past Medical History:  Diagnosis Date  . Abnormal Pap smear    many yrs ago  . Asthma   . Depression   . Hypertension   . STD (sexually transmitted disease)    HSV2    Past Surgical History:  Procedure Laterality Date  . CHOLECYSTECTOMY N/A 05/11/2014   Procedure: LAPAROSCOPIC CHOLECYSTECTOMY WITH INTRAOPERATIVE CHOLANGIOGRAM;  Surgeon: Zenovia Jarred, MD;  Location: [place];  Service: General;  Laterality: N/A;  . CHOLECYSTECTOMY  04/2014  . COLPOSCOPY    . CRYOTHERAPY     for abnormal pap  .  DILATION AND CURETTAGE OF UTERUS    . LAPAROSCOPIC APPENDECTOMY N/A 11/05/2017   Procedure: APPENDECTOMY LAPAROSCOPIC;  Surgeon: Coralie Keens, MD;  Location: [place];  Service: General;  Laterality: N/A;    Current Outpatient Medications  Medication Sig Dispense Refill  . albuterol (PROVENTIL HFA;VENTOLIN HFA) 108 (90 Base) MCG/ACT inhaler Inhale 2 puffs into the lungs every 6 (six) hours as needed for wheezing or shortness of breath. 1 Inhaler 0  . amLODipine (NORVASC) 5 MG tablet TAKE 1 TABLET(5 MG) BY MOUTH DAILY 90 tablet 0  . ibuprofen (ADVIL,MOTRIN) 200 MG tablet Take 200 mg by mouth as needed.    . Multiple Vitamins-Minerals (MULTIVITAMIN PO) Take 1 tablet by mouth daily as needed (supplemental).     . norethindrone (MICRONOR,CAMILA,ERRIN) 0.35 MG tablet Take 1 tablet (0.35 mg total) by mouth daily. 3 Package 4  . valACYclovir (VALTREX) 1000 MG tablet Take one daily at onset of outbreak 45 tablet 12   No current facility-administered medications for this visit.     Family History  Problem Relation Age of Onset  . Diabetes Mother   . Cancer Mother        leukemia  . Lupus Mother   . Hypertension Mother   . Hypertension Father   . Breast cancer Maternal Aunt     ROS:  Pertinent items are noted in HPI.  Otherwise, a comprehensive ROS was negative.  Exam:   BP  110/78   Pulse 60   Resp 16   Ht 5' 1.75" (1.568 m)   Wt 175 lb (79.4 kg)   LMP 11/13/2018   BMI 32.27 kg/m  Height: 5' 1.75" (156.8 cm) Ht Readings from Last 3 Encounters:  12/09/18 5' 1.75" (1.568 m)  05/30/18 5' 1.5" (1.562 m)  04/05/18 5' 1.5" (1.562 m)    General appearance: alert, cooperative and appears stated age Head: Normocephalic, without obvious abnormality, atraumatic Neck: no adenopathy, supple, symmetrical, trachea midline and thyroid normal to inspection and palpation Lungs: clear to auscultation bilaterally Breasts: normal appearance, no masses or tenderness, No nipple retraction or  dimpling, No nipple discharge or bleeding, No axillary or supraclavicular adenopathy, pendulous bilateral Heart: regular rate and rhythm Abdomen: soft, non-tender; no masses,  no organomegaly Extremities: extremities normal, atraumatic, no cyanosis or edema Skin: Skin color, texture, turgor normal. No rashes or lesions Lymph nodes: Cervical, supraclavicular, and axillary nodes normal. No abnormal inguinal nodes palpated Neurologic: Grossly normal   Pelvic: External genitalia:  no lesions              Urethra:  normal appearing urethra with no masses, tenderness or lesions              Bartholin's and Skene's: normal                 Vagina: normal appearing vagina with normal color and discharge, no lesions              Cervix: multiparous appearance, no cervical motion tenderness and no lesions              Pap taken: Yes.   Bimanual Exam:  Uterus:  normal size, contour, position, consistency, mobility, non-tender and anteverted              Adnexa: normal adnexa and no mass, fullness, tenderness               Rectovaginal: Confirms               Anus:  normal sphincter tone, no lesions  Chaperone present: yes  A:  Well Woman with normal exam  Contraception  POP due to hypertension  History of of HSV 2 needs Rx  Update  Hypertension with PCP management  STD screening  Colonoscopy due  P:   Reviewed health and wellness pertinent to exam  Discussed risks/benefits/warning signs with POP and bleeding expectations. Desires continuance  Rx Micronor see order with instructions  Rx Valtrex see order with instructions  Continue follow up as indicated with MD  Lab: Gc,Chlamydia Affirm  Discussed risks/benefits of colonoscopy, declines scheduling, IFOB dispensed with instructions  Pap smear: yes   counseled on breast self exam, mammography screening, STD prevention, HIV risk factors and prevention, use and side effects of OCP's, adequate intake of calcium and vitamin D, diet and  exercise  return annually or prn  An After Visit Summary was printed and given to the patient.

## 2018-12-10 LAB — GC/CHLAMYDIA PROBE AMP
Chlamydia trachomatis, NAA: NEGATIVE
Neisseria gonorrhoeae by PCR: NEGATIVE

## 2018-12-10 LAB — VAGINITIS/VAGINOSIS, DNA PROBE
CANDIDA SPECIES: POSITIVE — AB
GARDNERELLA VAGINALIS: NEGATIVE
Trichomonas vaginosis: NEGATIVE

## 2018-12-13 ENCOUNTER — Other Ambulatory Visit: Payer: Self-pay

## 2018-12-13 MED ORDER — FLUCONAZOLE 150 MG PO TABS: ORAL_TABLET | ORAL | 0 refills | Status: DC

## 2018-12-15 ENCOUNTER — Other Ambulatory Visit: Payer: Self-pay | Admitting: Certified Nurse Midwife

## 2018-12-15 DIAGNOSIS — R87612 Low grade squamous intraepithelial lesion on cytologic smear of cervix (LGSIL): Secondary | ICD-10-CM

## 2018-12-15 LAB — CYTOLOGY - PAP: HPV: NOT DETECTED

## 2018-12-26 ENCOUNTER — Other Ambulatory Visit: Payer: Self-pay | Admitting: Certified Nurse Midwife

## 2018-12-26 ENCOUNTER — Telehealth: Payer: Self-pay | Admitting: Certified Nurse Midwife

## 2018-12-26 DIAGNOSIS — Z3041 Encounter for surveillance of contraceptive pills: Secondary | ICD-10-CM

## 2018-12-26 NOTE — Telephone Encounter (Signed)
No answer, mailbox full, unable to leave message.

## 2018-12-26 NOTE — Telephone Encounter (Signed)
Patient called and cancelled Colpo scheduled for Wednesday due to having the flu.

## 2018-12-28 ENCOUNTER — Ambulatory Visit: Payer: PRIVATE HEALTH INSURANCE | Admitting: Certified Nurse Midwife

## 2018-12-30 NOTE — Telephone Encounter (Signed)
Spoke with patient. LMP approximately 12/10/18. POP for contraceptive. Patient request to schedule on a Friday after 2/26. Colpo scheduled for 2/28 at 1:30pm with Melvia Heaps, CNM. Advised to take Motrin 800 mg with food and water one hour before procedure.  Routing to provider for final review. Patient is agreeable to disposition. Will close encounter.  Cc: Lerry Liner, Magdalene Patricia

## 2019-01-05 ENCOUNTER — Other Ambulatory Visit: Payer: Self-pay | Admitting: Certified Nurse Midwife

## 2019-01-05 DIAGNOSIS — A6009 Herpesviral infection of other urogenital tract: Secondary | ICD-10-CM

## 2019-01-20 ENCOUNTER — Encounter: Payer: Self-pay | Admitting: Certified Nurse Midwife

## 2019-01-20 ENCOUNTER — Other Ambulatory Visit: Payer: Self-pay | Admitting: Family Medicine

## 2019-01-20 ENCOUNTER — Other Ambulatory Visit: Payer: Self-pay

## 2019-01-20 ENCOUNTER — Ambulatory Visit (INDEPENDENT_AMBULATORY_CARE_PROVIDER_SITE_OTHER): Payer: PRIVATE HEALTH INSURANCE | Admitting: Certified Nurse Midwife

## 2019-01-20 VITALS — BP 120/80 | HR 70 | Resp 16 | Wt 179.0 lb

## 2019-01-20 DIAGNOSIS — I10 Essential (primary) hypertension: Secondary | ICD-10-CM

## 2019-01-20 DIAGNOSIS — N898 Other specified noninflammatory disorders of vagina: Secondary | ICD-10-CM

## 2019-01-20 DIAGNOSIS — R87612 Low grade squamous intraepithelial lesion on cytologic smear of cervix (LGSIL): Secondary | ICD-10-CM | POA: Diagnosis not present

## 2019-01-20 NOTE — Telephone Encounter (Signed)
Pre authorization needed on refill of Amlodipine 5 mg tablet.

## 2019-01-20 NOTE — Telephone Encounter (Signed)
Copied from [place] [id number]. Topic: Quick Communication - Rx Refill/Question >> Jan 20, 2019 10:34 AM Gustavus Messing wrote: Medication: amLODipine (NORVASC) 5 MG tablet   Has the patient contacted their pharmacy? Yes.    (Agent: If yes, when and what did the pharmacy advise?) Pharmacy is saying that they are waiting for authorization from DR. Patient also does not know why medication has to be pre-authorized every time she needs a refill  Preferred Pharmacy (with phone number or street name): [facility], [place] [phone] (Phone) [phone] (Fax)    Agent: Please be advised that RX refills may take up to 3 business days. We ask that you follow-up with your pharmacy.

## 2019-01-20 NOTE — Patient Instructions (Signed)

## 2019-01-20 NOTE — Progress Notes (Signed)
12-09-2018 LGSIL HPV HR neg Previous abnormal history Patient took 4 aleve at 1pm

## 2019-01-20 NOTE — Progress Notes (Addendum)
Patient ID: Beth Frost, female   DOB: [birth year], 48 y.o.   MRN: [id number]  Chief Complaint  Patient presents with  . Colposcopy    //jj    HPI Beth Frost is a 48 yo [id number] African American  female here for colposcopy exam. Denies vaginal pelvic pain or vaginal bleeding. Contraception:POP. Patient has noted some vaginal irritation from use of new vaginal product. Please check.  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 Previous colposcopy: none. Prior cervical treatment:none  Past Medical History:  Diagnosis Date  . Abnormal Pap smear    many yrs ago, 2020 LGSIL  . Asthma   . Depression   . Hypertension   . STD (sexually transmitted disease)    HSV2    Past Surgical History:  Procedure Laterality Date  . CHOLECYSTECTOMY N/A 05/11/2014   Procedure: LAPAROSCOPIC CHOLECYSTECTOMY WITH INTRAOPERATIVE CHOLANGIOGRAM;  Surgeon: Zenovia Jarred, MD;  Location: [facility];  Service: General;  Laterality: N/A;  . CHOLECYSTECTOMY  04/2014  . COLPOSCOPY    . CRYOTHERAPY     for abnormal pap  . DILATION AND CURETTAGE OF UTERUS    . LAPAROSCOPIC APPENDECTOMY N/A 11/05/2017   Procedure: APPENDECTOMY LAPAROSCOPIC;  Surgeon: Coralie Keens, MD;  [facility];  Service: General;  Laterality: N/A;    Family History  Problem Relation Age of Onset  . Diabetes Mother   . Cancer Mother        leukemia  . Lupus Mother   . Hypertension Mother   . Hypertension Father   . Breast cancer Maternal Aunt     Social History Social History   Tobacco Use  . Smoking status: Never Smoker  . Smokeless tobacco: Never Used  Substance Use Topics  . Alcohol use: Yes    Alcohol/week: 3.0 standard drinks    Types: 3 Standard drinks or equivalent per week  . Drug use: No    No Known Allergies  Current Outpatient Medications  Medication Sig Dispense Refill  . albuterol (PROVENTIL HFA;VENTOLIN  HFA) 108 (90 Base) MCG/ACT inhaler Inhale 2 puffs into the lungs every 6 (six) hours as needed for wheezing or shortness of breath. 1 Inhaler 0  . amLODipine (NORVASC) 5 MG tablet TAKE 1 TABLET(5 MG) BY MOUTH DAILY 90 tablet 0  . ibuprofen (ADVIL,MOTRIN) 200 MG tablet Take 200 mg by mouth as needed.    . Multiple Vitamins-Minerals (MULTIVITAMIN PO) Take 1 tablet by mouth daily as needed (supplemental).     . norethindrone (MICRONOR,CAMILA,ERRIN) 0.35 MG tablet Take 1 tablet (0.35 mg total) by mouth daily. 3 Package 4  . valACYclovir (VALTREX) 1000 MG tablet Take one daily at onset of outbreak 45 tablet 12   No current facility-administered medications for this visit.     Review of Systems Review of Systems  Constitutional: Negative.   HENT: Negative.   Eyes: Negative.   Respiratory: Negative.   Gastrointestinal: Negative.   Endocrine: Negative.   Genitourinary:       Vaginal irritation from new product  Skin: Negative.   Allergic/Immunologic: Negative.   Neurological: Negative.   Hematological: Negative.   Psychiatric/Behavioral: Negative.     Blood pressure 120/80, pulse 70, resp. rate 16, weight 179 lb (81.2 kg), last menstrual period 01/06/2019.  Physical Exam Physical Exam Exam conducted with a chaperone present.  Constitutional:      Appearance: Normal appearance.  Cardiovascular:     Rate and Rhythm: Normal rate.  Pulmonary:  Effort: Pulmonary effort is normal.  Genitourinary:    General: Normal vulva.     Exam position: Lithotomy position.     Pubic Area: No rash.      Labia:        Right: No rash, tenderness or lesion.        Left: No rash, tenderness or lesion.      Vagina: Vaginal discharge present. No tenderness, bleeding or lesions.     Cervix: No cervical motion tenderness or cervical bleeding.     Lymphadenopathy:     Lower Body: No right inguinal adenopathy. No left inguinal adenopathy.  Skin:    General: Skin is warm and dry.  Neurological:      Mental Status: She is alert and oriented to person, place, and time.  Psychiatric:        Mood and Affect: Mood and affect normal.        Speech: Speech normal.        Behavior: Behavior normal.        Thought Content: Thought content normal.        Cognition and Memory: Cognition and memory normal.        Judgment: Judgment normal.     Data Reviewed Reviewed pap smear information and finding. Questions addressed.  Assessment    Procedure Details  The risks and benefits of the procedure and Written informed consent obtained.  Speculum placed in vagina and scant vaginal discharge noted. Affirm collected. Excellent visualization of cervix achieved, cervix swabbed x 3 with Saline solution and  acetic acid solution. Cervix viewed with 3.75, 7.5 and 15# Green filter. Acetowhite response noted at 4 and 12 o'clock. Lugol's solution applied and very light staining noted in both areas. Biopsy taken at 4 o'clock and 12 o'clock. ECC obtained. Monsel's applied with no active bleeding noted with removal of speculum. Patient tolerated procedure well. Printed and verbal instructions given. Patient escorted to check out in stable condition.  Specimens: 3  Complications: none.     Plan    Specimens labelled and sent to Pathology. Affirm sent to lab. Patient will be called with results once reviewed. Biopsy at 4 o'clock and 12 o'clock showed focal koiocytic atypia consistent with LSIL, CIN 1, ECC showed benign endocervical and squamous mucosa. Patient to be called with results and need for repeat pap smear in one year. Pap recall placed 08      Melvia Heaps 01/20/2019, 1:53 PM

## 2019-01-23 ENCOUNTER — Other Ambulatory Visit: Payer: Self-pay | Admitting: Family Medicine

## 2019-01-23 DIAGNOSIS — I10 Essential (primary) hypertension: Secondary | ICD-10-CM

## 2019-01-23 LAB — VAGINITIS/VAGINOSIS, DNA PROBE
Candida Species: NEGATIVE
Gardnerella vaginalis: NEGATIVE
TRICHOMONAS VAG: NEGATIVE

## 2019-01-23 MED ORDER — AMLODIPINE BESYLATE 5 MG PO TABS: ORAL_TABLET | ORAL | 0 refills | Status: DC

## 2019-01-23 NOTE — Telephone Encounter (Signed)
Pt has called back on Question on 2/28. Informed her refill in progress but wants to know why she has to call everytime

## 2019-01-27 ENCOUNTER — Telehealth: Payer: Self-pay | Admitting: Certified Nurse Midwife

## 2019-01-27 NOTE — Telephone Encounter (Signed)
-----   Message from Regina Eck, CNM sent at 01/25/2019  7:39 AM EST ----- Notify patient her biopsies show Koiocytic atypia consistent with LSIL, CIN 1 ECC showed benign endocervical mucosa No further evaluation at this time. Needs repeat pap in one year, very important to follow. Pap recall 08

## 2019-01-27 NOTE — Telephone Encounter (Signed)
Patient is calling regarding results.

## 2019-01-27 NOTE — Telephone Encounter (Signed)
Spoke with patient, advised as seen below per Melvia Heaps, CNM. Patient verbalizes understanding and is agreeable.   08 recall placed.   Encounter closed.

## 2019-02-09 ENCOUNTER — Telehealth: Payer: Self-pay

## 2019-02-09 NOTE — Telephone Encounter (Signed)
Ifob not mailed in yet. Patient states she will do her ifob and mail it in. Patient is aware of the importance of having it done.

## 2019-02-22 ENCOUNTER — Other Ambulatory Visit: Payer: Self-pay | Admitting: Family Medicine

## 2019-02-22 DIAGNOSIS — I10 Essential (primary) hypertension: Secondary | ICD-10-CM

## 2019-02-28 ENCOUNTER — Ambulatory Visit (INDEPENDENT_AMBULATORY_CARE_PROVIDER_SITE_OTHER): Payer: PRIVATE HEALTH INSURANCE | Admitting: Family Medicine

## 2019-02-28 ENCOUNTER — Other Ambulatory Visit: Payer: Self-pay

## 2019-02-28 ENCOUNTER — Encounter: Payer: Self-pay | Admitting: Family Medicine

## 2019-02-28 ENCOUNTER — Telehealth: Payer: Self-pay | Admitting: Family Medicine

## 2019-02-28 ENCOUNTER — Telehealth: Payer: PRIVATE HEALTH INSURANCE | Admitting: Family Medicine

## 2019-02-28 VITALS — BP 123/73 | HR 85 | Temp 99.0°F | Resp 14 | Ht 62.0 in | Wt 181.6 lb

## 2019-02-28 DIAGNOSIS — I10 Essential (primary) hypertension: Secondary | ICD-10-CM

## 2019-02-28 DIAGNOSIS — E663 Overweight: Secondary | ICD-10-CM | POA: Diagnosis not present

## 2019-02-28 DIAGNOSIS — F4322 Adjustment disorder with anxiety: Secondary | ICD-10-CM

## 2019-02-28 MED ORDER — AMLODIPINE BESYLATE 5 MG PO TABS: 5.0000 mg | ORAL_TABLET | Freq: Every day | ORAL | 3 refills | Status: DC

## 2019-02-28 NOTE — Addendum Note (Signed)
Addended by: Leim Fabry on: 02/28/2019 04:10 PM   Modules accepted: Orders

## 2019-02-28 NOTE — Progress Notes (Signed)
CC: hypertension  HPI Hypertension: Patient here for follow-up of elevated blood pressure. She is not exercising and is adherent to low salt diet.  Blood pressure is well controlled at home. Cardiac symptoms none. Patient denies chest pressure/discomfort, dyspnea, exertional chest pressure/discomfort, irregular heart beat and near-syncope.  Cardiovascular risk factors: hypertension. Use of agents associated with hypertension: none. History of target organ damage: none.  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 Abnormal Pap smear    many yrs ago, 2020 LGSIL  . Asthma   . Depression   . Hypertension   . STD (sexually transmitted disease)    HSV2    Current Outpatient Medications  Medication Sig Dispense Refill  . albuterol (PROVENTIL HFA;VENTOLIN HFA) 108 (90 Base) MCG/ACT inhaler Inhale 2 puffs into the lungs every 6 (six) hours as needed for wheezing or shortness of breath. 1 Inhaler 0  . amLODipine (NORVASC) 5 MG tablet Take 1 tablet (5 mg total) by mouth daily. 90 tablet 3  . ibuprofen (ADVIL,MOTRIN) 200 MG tablet Take 200 mg by mouth as needed.    . Multiple Vitamins-Minerals (MULTIVITAMIN PO) Take 1 tablet by mouth daily as needed (supplemental).     . norethindrone (MICRONOR,CAMILA,ERRIN) 0.35 MG tablet Take 1 tablet (0.35 mg total) by mouth daily. 3 Package 4  . valACYclovir (VALTREX) 1000 MG tablet Take one daily at onset of outbreak 45 tablet 12   No current facility-administered medications for this visit.      Allergies: No Known Allergies  Past Surgical History:  Procedure Laterality Date  . CHOLECYSTECTOMY N/A 05/11/2014   Procedure: LAPAROSCOPIC CHOLECYSTECTOMY WITH INTRAOPERATIVE CHOLANGIOGRAM;  Surgeon: Zenovia Jarred, MD;  Location: [place];  Service: General;  Laterality: N/A;  . CHOLECYSTECTOMY  04/2014  . COLPOSCOPY    . CRYOTHERAPY     for abnormal pap  . DILATION AND CURETTAGE OF UTERUS    . LAPAROSCOPIC APPENDECTOMY N/A 11/05/2017   Procedure: APPENDECTOMY LAPAROSCOPIC;  Surgeon: Coralie Keens, MD;  Location: [place];  Service: General;  Laterality: N/A;    Social History   Socioeconomic History  . Marital status: Single    Spouse name: Not on file  . Number of children: Not on file  . Years of education: Not on file  . Highest education level: Not on file  Occupational History  . Not on file  Social Needs  . Financial resource strain: Not on file  . Food insecurity:    Worry: Not on file    Inability: Not on file  . Transportation needs:    Medical: Not on file    Non-medical: Not on file  Tobacco Use  . Smoking status: Never Smoker  . Smokeless tobacco: Never Used  Substance and Sexual Activity  . Alcohol use: Yes    Alcohol/week: 3.0 standard drinks    Types: 3 Standard drinks or equivalent per week  . Drug use: No  . Sexual activity: Yes  Partners: Male    Birth control/protection: Pill  Lifestyle  . Physical activity:    Days per week: Not on file    Minutes per session: Not on file  . Stress: Not on file  Relationships  . Social connections:    Talks on phone: Not on file    Gets together: Not on file    Attends religious service: Not on file    Active member of club or organization: Not on file    Attends meetings of clubs or organizations: Not on file    Relationship status: Not on file  Other Topics Concern  . Not on file  Social History Narrative  . Not on file    Family History  Problem Relation Age of Onset  . Diabetes  Mother   . Cancer Mother        leukemia  . Lupus Mother   . Hypertension Mother   . Hypertension Father   . Breast cancer Maternal Aunt      ROS Review of Systems See HPI Constitution: No fevers or chills No malaise No diaphoresis Skin: No rash or itching Eyes: no blurry vision, no double vision GU: no dysuria or hematuria Neuro: no dizziness or headaches all others reviewed and negative   Objective: Vitals:   02/28/19 1314  BP: 123/73  Pulse: 85  Resp: 14  Temp: 99 F (37.2 C)  TempSrc: Oral  SpO2: 99%  Weight: 181 lb 9.6 oz (82.4 kg)  Height: 5\' 2"  (1.575 m)   Wt Readings from Last 3 Encounters:  02/28/19 181 lb 9.6 oz (82.4 kg)  01/20/19 179 lb (81.2 kg)  12/09/18 175 lb (79.4 kg)     Physical Exam Physical Exam  Constitutional: Oriented to person, place, and time. Appears well-developed and well-nourished.  HENT:  Head: Normocephalic and atraumatic.  Eyes: Conjunctivae and EOM are normal.  Cardiovascular: Normal rate, regular rhythm, normal heart sounds and intact distal pulses.  No murmur heard. Pulmonary/Chest: Effort normal and breath sounds normal. No stridor. No respiratory distress. Has no wheezes.  Neurological: Is alert and oriented to person, place, and time.  Skin: Skin is warm. Capillary refill takes less than 2 seconds.  Psychiatric: Has a normal mood and affect. Behavior is normal. Judgment and thought content normal.   Assessment and Plan Diagnoses and all orders for this visit:  Essential hypertension- Patient's blood pressure is at goal of 139/89 or less. Condition is stable. Continue current medications and treatment plan. I recommend that you exercise for 30-45 minutes 5 days a week. I also recommend a balanced diet with fruits and vegetables every day, lean meats, and little fried foods. The DASH diet (you can find this online) is a good example of this.  -     Microalbumin, urine -     COMPLETE METABOLIC PANEL WITH GFR -      Lipid panel -     amLODipine (NORVASC) 5 MG tablet; Take 1 tablet (5 mg total) by mouth daily.  Overweight- discussed exercise program to help weight loss and mood  Adjustment disorder with anxious mood- discussed ways to stay connected using technology such as [place]

## 2019-02-28 NOTE — Telephone Encounter (Signed)
Appointment scheduled for HTN follow-up.

## 2019-02-28 NOTE — Patient Instructions (Addendum)
If you have lab work done today you will be contacted with your lab results within the next 2 weeks.  If you have not heard from [place] then please contact us. The fastest way to get your results is to register for My Chart.   IF you received an x-ray today, you will receive an invoice from [facility]. Please contact [facility] at [phone] with questions or concerns regarding your invoice.   IF you received labwork today, you will receive an invoice from [facility]. Please contact [facility] at [phone] with questions or concerns regarding your invoice.   Our billing staff will not be able to assist you with questions regarding bills from these companies.  You will be contacted with the lab results as soon as they are available. The fastest way to get your results is to activate your My Chart account. Instructions are located on the last page of this paperwork. If you have not heard from [place] regarding the results in 2 weeks, please contact this office.      Hypertension Hypertension is another name for high blood pressure. High blood pressure forces your heart to work harder to pump blood. This can cause problems over time. There are two numbers in a blood pressure reading. There is a top number (systolic) over a bottom number (diastolic). It is best to have a blood pressure below 120/80. Healthy choices can help lower your blood pressure. You may need medicine to help lower your blood pressure if:  Your blood pressure cannot be lowered with healthy choices.  Your blood pressure is higher than 130/80. Follow these instructions at home: Eating and drinking   If directed, follow the DASH eating plan. This diet includes: ? Filling half of your plate at each meal with fruits and vegetables. ? Filling one quarter of your plate at each meal with whole grains. Whole grains include whole wheat pasta, brown rice, and whole grain bread. ? Eating or drinking low-fat dairy  products, such as skim milk or low-fat yogurt. ? Filling one quarter of your plate at each meal with low-fat (lean) proteins. Low-fat proteins include fish, skinless chicken, eggs, beans, and tofu. ? Avoiding fatty meat, cured and processed meat, or chicken with skin. ? Avoiding premade or processed food.  Eat less than 1,500 mg of salt (sodium) a day.  Limit alcohol use to no more than 1 drink a day for nonpregnant women and 2 drinks a day for men. One drink equals 12 oz of beer, 5 oz of wine, or 1 oz of hard liquor. Lifestyle  Work with your doctor to stay at a healthy weight or to lose weight. Ask your doctor what the best weight is for you.  Get at least 30 minutes of exercise that causes your heart to beat faster (aerobic exercise) most days of the week. This may include walking, swimming, or biking.  Get at least 30 minutes of exercise that strengthens your muscles (resistance exercise) at least 3 days a week. This may include lifting weights or pilates.  Do not use any products that contain nicotine or tobacco. This includes cigarettes and e-cigarettes. If you need help quitting, ask your doctor.  Check your blood pressure at home as told by your doctor.  Keep all follow-up visits as told by your doctor. This is important. Medicines  Take over-the-counter and prescription medicines only as told by your doctor. Follow directions carefully.  Do not skip doses of blood pressure medicine. The medicine  does not work as well if you skip doses. Skipping doses also puts you at risk for problems.  Ask your doctor about side effects or reactions to medicines that you should watch for. Contact a doctor if:  You think you are having a reaction to the medicine you are taking.  You have headaches that keep coming back (recurring).  You feel dizzy.  You have swelling in your ankles.  You have trouble with your vision. Get help right away if:  You get a very bad headache.  You  start to feel confused.  You feel weak or numb.  You feel faint.  You get very bad pain in your: ? Chest. ? Belly (abdomen).  You throw up (vomit) more than once.  You have trouble breathing. Summary  Hypertension is another name for high blood pressure.  Making healthy choices can help lower blood pressure. If your blood pressure cannot be controlled with healthy choices, you may need to take medicine. This information is not intended to replace advice given to you by your health care provider. Make sure you discuss any questions you have with your health care provider. Document Released: 04/27/2008 Document Revised: 10/07/2016 Document Reviewed: 10/07/2016 Elsevier Interactive Patient Education  2019 [employer].

## 2019-02-28 NOTE — Telephone Encounter (Signed)
Copied from [place] [id number]. Topic: Quick Communication - Rx Refill/Question >> Feb 28, 2019 10:06 AM Rainey Pines A wrote: Medication: amLODipine (NORVASC) 5 MG tablet (Patient stated that she normally receives 30 pills but the prescription was for only 15 pills.)  Has the patient contacted their pharmacy? Yes (Agent: If no, request that the patient contact the pharmacy for the refill.) (Agent: If yes, when and what did the pharmacy advise?) Contact PCP  Preferred Pharmacy (with phone number or street name): [facility] [place], [place] [phone] (Phone) [phone] (Fax)    Agent: Please be advised that RX refills may take up to 3 business days. We ask that you follow-up with your pharmacy.

## 2019-02-28 NOTE — Progress Notes (Signed)
CC- f/u for blood pressure and refill on amlodipine. Patient stated she did not know why her rx for amlodipine is not a 90 day supply.I did change the pended med is set up for 90 day.

## 2019-02-28 NOTE — Addendum Note (Signed)
Addended by: Leim Fabry on: 02/28/2019 04:12 PM   Modules accepted: Orders

## 2019-03-01 LAB — CMP14+EGFR
ALT: 8 IU/L (ref 0–32)
AST: 17 IU/L (ref 0–40)
Albumin/Globulin Ratio: 1.6 (ref 1.2–2.2)
Albumin: 4.3 g/dL (ref 3.8–4.8)
Alkaline Phosphatase: 63 IU/L (ref 39–117)
BUN/Creatinine Ratio: 13 (ref 9–23)
BUN: 7 mg/dL (ref 6–24)
Bilirubin Total: 1 mg/dL (ref 0.0–1.2)
CO2: 21 mmol/L (ref 20–29)
Calcium: 9.1 mg/dL (ref 8.7–10.2)
Chloride: 105 mmol/L (ref 96–106)
Creatinine, Ser: 0.52 mg/dL — ABNORMAL LOW (ref 0.57–1.00)
GFR calc Af Amer: 133 mL/min/{1.73_m2} (ref >59)
GFR calc non Af Amer: 116 mL/min/{1.73_m2} (ref >59)
Globulin, Total: 2.7 g/dL (ref 1.5–4.5)
Glucose: 118 mg/dL — ABNORMAL HIGH (ref 65–99)
Potassium: 3.9 mmol/L (ref 3.5–5.2)
Sodium: 141 mmol/L (ref 134–144)
Total Protein: 7 g/dL (ref 6.0–8.5)

## 2019-03-01 LAB — LIPID PANEL
Chol/HDL Ratio: 2.5 ratio (ref 0.0–4.4)
Cholesterol, Total: 183 mg/dL (ref 100–199)
HDL: 73 mg/dL (ref >39)
LDL Calculated: 99 mg/dL (ref 0–99)
Triglycerides: 55 mg/dL (ref 0–149)
VLDL Cholesterol Cal: 11 mg/dL (ref 5–40)

## 2019-03-01 LAB — MICROALBUMIN, URINE: Microalbumin, Urine: 13.5 ug/mL

## 2019-03-02 ENCOUNTER — Encounter: Payer: Self-pay | Admitting: Radiology

## 2019-05-08 ENCOUNTER — Encounter: Payer: Self-pay | Admitting: Obstetrics and Gynecology

## 2019-05-08 ENCOUNTER — Other Ambulatory Visit: Payer: Self-pay

## 2019-05-08 ENCOUNTER — Ambulatory Visit: Payer: PRIVATE HEALTH INSURANCE | Admitting: Obstetrics and Gynecology

## 2019-05-08 VITALS — BP 122/68 | HR 84 | Temp 97.6°F | Resp 12 | Ht 62.0 in | Wt 163.0 lb

## 2019-05-08 DIAGNOSIS — N760 Acute vaginitis: Secondary | ICD-10-CM | POA: Diagnosis not present

## 2019-05-08 DIAGNOSIS — N898 Other specified noninflammatory disorders of vagina: Secondary | ICD-10-CM | POA: Diagnosis not present

## 2019-05-08 DIAGNOSIS — D219 Benign neoplasm of connective and other soft tissue, unspecified: Secondary | ICD-10-CM

## 2019-05-08 NOTE — Patient Instructions (Signed)
Beth Frost, you may have a vaginal wall cyst called a gartner's duct cyst.  this is close to your urethra tube.  We will invite you back for a pelvic ultrasound.   Beth Half, MD  Uterine Fibroids  Uterine fibroids (leiomyomas) are noncancerous (benign) tumors that can develop in the uterus. Fibroids may also develop in the fallopian tubes, cervix, or tissues (ligaments) near the uterus. You may have one or many fibroids. Fibroids vary in size, weight, and where they grow in the uterus. Some can become quite large. Most fibroids do not require medical treatment. What are the causes? The cause of this condition is not known. What increases the risk? You are more likely to develop this condition if you:  Are in your 30s or 40s and have not gone through menopause.  Have a family history of this condition.  Are of African-American descent.  Had your first period at an early age (early menarche).  Have not had any children (nulliparity).  Are overweight or obese. What are the signs or symptoms? Many women do not have any symptoms. Symptoms of this condition may include:  Heavy menstrual bleeding.  Bleeding or spotting between periods.  Pain and pressure in the pelvic area, between the hips.  Bladder problems, such as needing to urinate urgently or more often than usual.  Inability to have children (infertility).  Failure to carry pregnancy to term (miscarriage). How is this diagnosed? This condition may be diagnosed based on:  Your symptoms and medical history.  A physical exam.  A pelvic exam that includes feeling for any tumors.  Imaging tests, such as ultrasound or MRI. How is this treated? Treatment for this condition may include:  Seeing your health care provider for follow-up visits to monitor your fibroids for any changes.  Taking NSAIDs such as ibuprofen, naproxen, or aspirin to reduce pain.  Hormone medicines. These may be taken as a pill, given in an  injection, or delivered by a T-shaped device that is inserted into the uterus (intrauterine device, IUD).  Surgery to remove one of the following: ? The fibroids (myomectomy). Your health care provider may recommend this if fibroids affect your fertility and you want to become pregnant. ? The uterus (hysterectomy). ? Blood supply to the fibroids (uterine artery embolization). Follow these instructions at home:  Take over-the-counter and prescription medicines only as told by your health care provider.  Ask your health care provider if you should take iron pills or eat more iron-rich foods, such as dark green, leafy vegetables. Heavy menstrual bleeding can cause low iron levels.  If directed, apply heat to your back or abdomen to reduce pain. Use the heat source that your health care provider recommends, such as a moist heat pack or a heating pad. ? Place a towel between your skin and the heat source. ? Leave the heat on for 20-30 minutes. ? Remove the heat if your skin turns bright red. This is especially important if you are unable to feel pain, heat, or cold. You may have a greater risk of getting burned.  Pay close attention to your menstrual cycle. Tell your health care provider about any changes, such as: ? Increased blood flow that requires you to use more pads or tampons than usual. ? A change in the number of days that your period lasts. ? A change in symptoms that are associated with your period, such as back pain or cramps in your abdomen.  Keep all follow-up visits as told by  your health care provider. This is important, especially if your fibroids need to be monitored for any changes. Contact a health care provider if you:  Have pelvic pain, back pain, or cramps in your abdomen that do not get better with medicine or heat.  Develop new bleeding between periods.  Have increased bleeding during or between periods.  Feel unusually tired or weak.  Feel light-headed. Get help  right away if you:  Faint.  Have pelvic pain that suddenly gets worse.  Have severe vaginal bleeding that soaks a tampon or pad in 30 minutes or less. Summary  Uterine fibroids are noncancerous (benign) tumors that can develop in the uterus.  The exact cause of this condition is not known.  Most fibroids do not require medical treatment unless they affect your ability to have children (fertility).  Contact a health care provider if you have pelvic pain, back pain, or cramps in your abdomen that do not get better with medicines.  Make sure you know what symptoms should cause you to get help right away. This information is not intended to replace advice given to you by your health care provider. Make sure you discuss any questions you have with your health care provider. Document Released: 11/06/2000 Document Revised: 10/05/2017 Document Reviewed: 10/05/2017 Elsevier Interactive Patient Education  2019 [employer].

## 2019-05-08 NOTE — Progress Notes (Signed)
GYNECOLOGY  VISIT   HPI: 48 y.o.   Single  Caucasian  female   [phone] with Patient's last menstrual period was 04/25/2019 (within days).   here for vaginal burning and odor     Notices a fishy smell with intercourse.  Having some burning and irritation.  No itching or discharge.   No change in partner.   She is using a lot of different personal products.   No recent abx or steroids.  States she does not feel like an HSV outbreak.   Occasional urinary incontinence.  No double voiding.   CT scan in 2018 showing fibroids.  Patient states she is unaware of this dx.  This was done when she had appendicitis.   GYNECOLOGIC HISTORY: Patient's last menstrual period was 04/25/2019 (within days). Contraception:  Micronor Menopausal hormone therapy:  n/a Last mammogram:  09/30/18 BIRADS 1 negative/density a Last pap smear:   12/19/18 LSIL:Neg HR HPV; 01/20/19 Colpo showed Koiocytic atypia consistent with LSIL, CIN 1, ECC showed benign endocervical mucosa        OB History    Gravida  4   Para  2   Term  2   Preterm      AB  2   Living  2     SAB  1   TAB  1   Ectopic      Multiple      Live Births  2              Patient Active Problem List   Diagnosis Date Noted  . Acute appendicitis 11/04/2017  . Essential hypertension 11/26/2016    Class: Family History of  . Herpes genitalis in women 07/18/2014    Class: History of    Past Medical History:  Diagnosis Date  . Abnormal Pap smear    many yrs ago, 2020 LGSIL  . Asthma   . Depression   . Hypertension   . STD (sexually transmitted disease)    HSV2    Past Surgical History:  Procedure Laterality Date  . CHOLECYSTECTOMY N/A 05/11/2014   Procedure: LAPAROSCOPIC CHOLECYSTECTOMY WITH INTRAOPERATIVE CHOLANGIOGRAM;  Surgeon: Zenovia Jarred, MD;  Location: [place];  Service: General;  Laterality: N/A;  . CHOLECYSTECTOMY  04/2014  . COLPOSCOPY    . CRYOTHERAPY     for abnormal pap  . DILATION AND  CURETTAGE OF UTERUS    . LAPAROSCOPIC APPENDECTOMY N/A 11/05/2017   Procedure: APPENDECTOMY LAPAROSCOPIC;  Surgeon: Coralie Keens, MD;  Location: [place];  Service: General;  Laterality: N/A;    Current Outpatient Medications  Medication Sig Dispense Refill  . albuterol (PROVENTIL HFA;VENTOLIN HFA) 108 (90 Base) MCG/ACT inhaler Inhale 2 puffs into the lungs every 6 (six) hours as needed for wheezing or shortness of breath. 1 Inhaler 0  . amLODipine (NORVASC) 5 MG tablet Take 1 tablet (5 mg total) by mouth daily. 90 tablet 3  . ibuprofen (ADVIL,MOTRIN) 200 MG tablet Take 200 mg by mouth as needed.    . Multiple Vitamins-Minerals (MULTIVITAMIN PO) Take 1 tablet by mouth daily as needed (supplemental).     . norethindrone (MICRONOR,CAMILA,ERRIN) 0.35 MG tablet Take 1 tablet (0.35 mg total) by mouth daily. 3 Package 4  . phentermine (ADIPEX-P) 37.5 MG tablet Take 37.5 mg by mouth daily.    . valACYclovir (VALTREX) 1000 MG tablet Take one daily at onset of outbreak 45 tablet 12   No current facility-administered medications for this visit.      ALLERGIES:  Patient has no known allergies.  Family History  Problem Relation Age of Onset  . Diabetes Mother   . Cancer Mother        leukemia  . Lupus Mother   . Hypertension Mother   . Hypertension Father   . Breast cancer Maternal Aunt     Social History   Socioeconomic History  . Marital status: Single    Spouse name: Not on file  . Number of children: Not on file  . Years of education: Not on file  . Highest education level: Not on file  Occupational History  . Not on file  Social Needs  . Financial resource strain: Not on file  . Food insecurity    Worry: Not on file    Inability: Not on file  . Transportation needs    Medical: Not on file    Non-medical: Not on file  Tobacco Use  . Smoking status: Never Smoker  . Smokeless tobacco: Never Used  Substance and Sexual Activity  . Alcohol use: Yes    Alcohol/week: 3.0  standard drinks    Types: 3 Standard drinks or equivalent per week  . Drug use: No  . Sexual activity: Yes    Partners: Male    Birth control/protection: Pill  Lifestyle  . Physical activity    Days per week: Not on file    Minutes per session: Not on file  . Stress: Not on file  Relationships  . Social Herbalist on phone: Not on file    Gets together: Not on file    Attends religious service: Not on file    Active member of club or organization: Not on file    Attends meetings of clubs or organizations: Not on file    Relationship status: Not on file  . Intimate partner violence    Fear of current or ex partner: Not on file    Emotionally abused: Not on file    Physically abused: Not on file    Forced sexual activity: Not on file  Other Topics Concern  . Not on file  Social History Narrative  . Not on file    Review of Systems  Constitutional: Negative.   HENT: Negative.   Eyes: Negative.   Respiratory: Negative.   Cardiovascular: Negative.   Gastrointestinal: Negative.   Endocrine: Negative.   Genitourinary:       Vaginal burning Vaginal odor  Musculoskeletal: Negative.   Skin: Negative.   Allergic/Immunologic: Negative.   Neurological: Negative.   Hematological: Negative.   Psychiatric/Behavioral: Negative.     PHYSICAL EXAMINATION:    BP 122/68 (BP Location: Right Arm, Patient Position: Sitting, Cuff Size: Normal)   Pulse 84   Temp 97.6 F (36.4 C) (Temporal)   Resp 12   Ht 5\' 2"  (1.575 m)   Wt 163 lb (73.9 kg)   LMP 04/25/2019 (Within Days)   BMI 29.81 kg/m     General appearance: alert, cooperative and appears stated age  Pelvic: External genitalia:  no lesions              Urethra:  normal appearing urethra.              Bartholins and Skenes: normal                 Vagina: normal appearing vagina with normal color and yellow green discharge, no lesions  Cervix: no lesions                Bimanual Exam:  Uterus:  normal  size, contour, position, consistency, mobility, non-tender                               1.5 cm anterior vaginal wall cyst at level of pubic symphysis.  Nontender.   Chaperone was present for exam.  ASSESSMENT  Vaginitis.  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  Fibroids.   PLAN  Affirm taken.  Tx to follow.  Discussed vaginal wall cyst formation. Will return to do pelvic US of vaginal wall cyst and check fibroids.  We had a preliminary discussion of the vaginal cyst and fibroids.  She may need an MRI of the pelvis if the pelvic ultrasound is not able to delineate the anterior vaginal cyst well.   An After Visit Summary was printed and given to the patient.  __15___ minutes face to face time of which over 50% was spent in counseling.

## 2019-05-09 ENCOUNTER — Other Ambulatory Visit: Payer: Self-pay | Admitting: *Deleted

## 2019-05-09 LAB — VAGINITIS/VAGINOSIS, DNA PROBE
Candida Species: NEGATIVE
Gardnerella vaginalis: POSITIVE — AB
Trichomonas vaginosis: NEGATIVE

## 2019-05-09 MED ORDER — METRONIDAZOLE 500 MG PO TABS: 500.0000 mg | ORAL_TABLET | Freq: Two times a day (BID) | ORAL | 0 refills | Status: DC

## 2019-05-10 NOTE — Progress Notes (Signed)
Telemedicine Encounter- SOAP NOTE Established Patient  This telephone encounter was conducted with the patient's (or proxy's) verbal consent via audio telecommunications: yes/no: Yes Patient was instructed to have this encounter in a suitably private space; and to only have persons present to whom they give permission to participate. In addition, patient identity was confirmed by use of name plus two identifiers (DOB and address).  I discussed the limitations, risks, security and privacy concerns of performing an evaluation and management service by telephone and the availability of in person appointments. I also discussed with the patient that there may be a patient responsible charge related to this service. The patient expressed understanding and agreed to proceed.  I spent a total of TIME; 0 MIN TO 60 MIN: 25 minutes talking with the patient or their proxy.

## 2019-05-11 ENCOUNTER — Telehealth: Payer: Self-pay | Admitting: Obstetrics and Gynecology

## 2019-05-11 NOTE — Telephone Encounter (Signed)
Spoke with patient. I advised her she had only been on Flagyl since 05-09-19 and it may take longer for her to notice improvement. She states I'm probably right, but wanted to call and let me know she is having vaginal "burning". She denies any urinary symptoms. I advised it still may take a little time to feel better. The Affirm was negative for yeast. Will let Dr.Silva know and see if she had any further recommendations. Patient is fine with giving it some more time. Routed to provider.

## 2019-05-11 NOTE — Telephone Encounter (Signed)
Spoke with patient in regards to benefits for an ultrasound. Patient understood and agreeable. Patient requested to schedule mid-July. Patient is scheduled 06/15/2019 with Dr Quincy Simmonds. Patient is aware of the appointment date, arrival time and the cancellation policy.  Patient also requested to speak with a nurse. Patient states medication prescribed 05/10/2019 is not working.  Routing to Triage Nurse

## 2019-05-11 NOTE — Telephone Encounter (Signed)
Spoke with Dr.Silva. She recommends patient to complete full 7 day course of Flagyl therapy. If she is not better at that time, to give [place] a call back. Patient does have appointment 06-15-19 for PUS. Patient thanked me for calling her back.

## 2019-05-13 DIAGNOSIS — I10 Essential (primary) hypertension: Secondary | ICD-10-CM | POA: Insufficient documentation

## 2019-05-13 DIAGNOSIS — J45909 Unspecified asthma, uncomplicated: Secondary | ICD-10-CM | POA: Insufficient documentation

## 2019-05-31 ENCOUNTER — Ambulatory Visit: Payer: Self-pay | Admitting: *Deleted

## 2019-05-31 NOTE — Telephone Encounter (Signed)
Woke up feeling achy, headache, 102.4 temp,chills, and fatigue. She took ibuprofen for her fever. She denies shortness of breath, or sore throat. She is requesting a virtual visit so she can get tested for covid-19.  Attempted to notify the Primary Care at [facility] twice, no answer. Routing to the office for review and an appointment. Home care advice given to patient with verba understanding.  Reason for Disposition . [1] COVID-19 infection suspected by caller or triager AND [2] mild symptoms (cough, fever, or others) AND [0] no complications or SOB  Answer Assessment - Initial Assessment Questions 1. COVID-19 DIAGNOSIS: "Who made your Coronavirus (COVID-19) diagnosis?" "Was it confirmed by a positive lab test?" If not diagnosed by a HCP, ask "Are there lots of cases (community spread) where you live?" (See public health department website, if unsure)     In the community and works a nursing 2. ONSET: "When did the COVID-19 symptoms start?"      In the middle of last night 3. WORST SYMPTOM: "What is your worst symptom?" (e.g., cough, fever, shortness of breath, muscle aches)     fever 4. COUGH: "Do you have a cough?" If so, ask: "How bad is the cough?"       no 5. FEVER: "Do you have a fever?" If so, ask: "What is your temperature, how was it measured, and when did it start?"     Yes and it was 102.4 6. RESPIRATORY STATUS: "Describe your breathing?" (e.g., shortness of breath, wheezing, unable to speak)      no 7. BETTER-SAME-WORSE: "Are you getting better, staying the same or getting worse compared to yesterday?"  If getting worse, ask, "In what way?"     yes 8. HIGH RISK DISEASE: "Do you have any chronic medical problems?" (e.g., asthma, heart or lung disease, weak immune system, etc.)     Asthma, hypertension 9. PREGNANCY: "Is there any chance you are pregnant?" "When was your last menstrual period?"     No and LMP was last week 10. OTHER SYMPTOMS: "Do you have any other symptoms?"   (e.g., chills, fatigue, headache, loss of smell or taste, muscle pain, sore throat)       Fatigue, chills, headache, body aches  Protocols used: CORONAVIRUS (COVID-19) DIAGNOSED OR SUSPECTED-A-AH

## 2019-06-01 ENCOUNTER — Telehealth: Payer: Self-pay | Admitting: Family Medicine

## 2019-06-01 ENCOUNTER — Encounter: Payer: Self-pay | Admitting: Family Medicine

## 2019-06-01 ENCOUNTER — Telehealth (INDEPENDENT_AMBULATORY_CARE_PROVIDER_SITE_OTHER): Payer: PRIVATE HEALTH INSURANCE | Admitting: Family Medicine

## 2019-06-01 ENCOUNTER — Telehealth: Payer: Self-pay | Admitting: *Deleted

## 2019-06-01 ENCOUNTER — Other Ambulatory Visit: Payer: Self-pay

## 2019-06-01 VITALS — Temp 101.1°F | Ht 62.0 in | Wt 166.0 lb

## 2019-06-01 DIAGNOSIS — R5381 Other malaise: Secondary | ICD-10-CM | POA: Diagnosis not present

## 2019-06-01 DIAGNOSIS — R509 Fever, unspecified: Secondary | ICD-10-CM

## 2019-06-01 DIAGNOSIS — Z20822 Contact with and (suspected) exposure to covid-19: Secondary | ICD-10-CM

## 2019-06-01 NOTE — Telephone Encounter (Signed)
Pt scheduled for testing at [facility] on 06/02/19.

## 2019-06-01 NOTE — Progress Notes (Signed)
Telemedicine Encounter- SOAP NOTE Established Patient  I discussed the limitations, risks, security and privacy concerns of performing an evaluation and management service by telephone and the availability of in person appointments. I also discussed with the patient that there may be a patient responsible charge related to this service. The patient expressed understanding and agreed to proceed.  This telephone encounter was conducted with the patient's  verbal consent via audio telecommunications: yes Patient was instructed to have this encounter in a suitably private space; and to only have persons present to whom they give permission to participate. In addition, patient identity was confirmed by use of name plus two identifiers (DOB and address).  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  No recent + at work.  Pt lives with boy friend who works outside of home, son-16yo, step son  Beth Frost is a 48 y.o. female established patient. Telephone visit today for fever, fatigue  HPI pt at work on Tuesday and did not feel well-temp 102.8-took tylenol. Body aches-"like the flu" yesterday. Headache today and yesterday-no n/v/d. NO acute symptoms of other family members. Pt with no difficulty swallowing-s/t on and off. Pt able to eat and is drinking water.    Patient Active Problem List   Diagnosis Date Noted  . Acute appendicitis 11/04/2017  . Essential hypertension 11/26/2016    Class: Family History of  . Herpes genitalis in women 07/18/2014    Class: History of    Past Medical History:  Diagnosis Date  . Abnormal Pap smear    many yrs ago, 2020 LGSIL  . Asthma   . Depression   . Hypertension   . STD (sexually transmitted disease)    HSV2    Current Outpatient Medications  Medication Sig Dispense Refill  . amLODipine (NORVASC) 5 MG tablet Take 1 tablet (5 mg total)  by mouth daily. 90 tablet 3  . ibuprofen (ADVIL,MOTRIN) 200 MG tablet Take 200 mg by mouth as needed.    . Multiple Vitamins-Minerals (MULTIVITAMIN PO) Take 1 tablet by mouth daily as needed (supplemental).     . norethindrone (MICRONOR,CAMILA,ERRIN) 0.35 MG tablet Take 1 tablet (0.35 mg total) by mouth daily. 3 Package 4  . phentermine (ADIPEX-P) 37.5 MG tablet Take 37.5 mg by mouth daily.    . valACYclovir (VALTREX) 1000 MG tablet Take one daily at onset of outbreak 45 tablet 12  . albuterol (PROVENTIL HFA;VENTOLIN HFA) 108 (90 Base) MCG/ACT inhaler Inhale 2 puffs into the lungs every 6 (six) hours as needed for wheezing or shortness of breath. (Patient not taking: Reported on 06/01/2019) 1 Inhaler 0   No current facility-administered medications for this visit.     Allergies  Allergen Reactions  . Flagyl [Metronidazole]     Itchy all over    Social History   Socioeconomic History  . Marital status: Single    Spouse name: Not on file  . Number of children: Not on file  . Years of education: Not on file  . Highest education level: Not on file  Occupational History  . Not on file  Social Needs  . Financial resource strain: Not on file  . Food insecurity    Worry: Not on file    Inability: Not on file  . Transportation needs    Medical: Not on file    Non-medical: Not on file  Tobacco Use  .  Smoking status: Never Smoker  . Smokeless tobacco: Never Used  Substance and Sexual Activity  . Alcohol use: Yes    Alcohol/week: 3.0 standard drinks    Types: 3 Standard drinks or equivalent per week  . Drug use: No  . Sexual activity: Yes    Partners: Male    Birth control/protection: Pill  Lifestyle  . Physical activity    Days per week: Not on file    Minutes per session: Not on file  . Stress: Not on file  Relationships  . Social Herbalist on phone: Not on file    Gets together: Not on file    Attends religious service: Not on file    Active member of club or  organization: Not on file    Attends meetings of clubs or organizations: Not on file    Relationship status: Not on file  . Intimate partner violence    Fear of current or ex partner: Not on file    Emotionally abused: Not on file    Physically abused: Not on file    Forced sexual activity: Not on file  Other Topics Concern  . Not on file  Social History Narrative  . Not on file    Review of Systems  Constitutional: Positive for fever and malaise/fatigue.  HENT: Positive for sore throat. Negative for congestion.   Respiratory: Negative for cough and shortness of breath.   Gastrointestinal: Negative for diarrhea.  Musculoskeletal: Positive for myalgias.  Neurological: Positive for weakness and headaches.  s/t intermittent-able to swallow 101.6 today Objective   Vitals as reported by the patient: Today's Vitals   06/01/19 1429  Temp: (!) 101.1 F (38.4 C)  TempSrc: Oral  Weight: 166 lb (75.3 kg)  Height: 5\' 2"  (1.575 m)   No exam-tele-visit  1. Fever, unspecified COVID testing Taking tylenol Fluids and rest Isolate from family members Out of work until testing completed 2. Malaise  I discussed the assessment and treatment plan with the patient. The patient was provided an opportunity to ask questions and all were answered. The patient agreed with the plan and demonstrated an understanding of the instructions.   The patient was advised to call back or seek an in-person evaluation if the symptoms worsen or if the condition fails to improve as anticipated.  I provided 58minutes of non-face-to-face time during this encounter.   Hannah Beat, MD  Primary Care at [facility] 06-01-19

## 2019-06-01 NOTE — Telephone Encounter (Signed)
Beth Hancock, MD  [facility] Pool    Fever, fatigue, headache      Pt called and scheduled for testing at [facility] site on 06/02/19. Pt advised to wear a mask and to remain in the car at the time of appt. Understanding verbalized.

## 2019-06-01 NOTE — Telephone Encounter (Signed)
Fever, fatigue

## 2019-06-01 NOTE — Progress Notes (Signed)
Fever since tue night  Bad Headache  Body aches   Wanting to COVID-19 testing

## 2019-06-02 ENCOUNTER — Other Ambulatory Visit: Payer: PRIVATE HEALTH INSURANCE

## 2019-06-02 DIAGNOSIS — Z20822 Contact with and (suspected) exposure to covid-19: Secondary | ICD-10-CM

## 2019-06-07 LAB — NOVEL CORONAVIRUS, NAA: SARS-CoV-2, NAA: DETECTED — AB

## 2019-06-12 ENCOUNTER — Telehealth (INDEPENDENT_AMBULATORY_CARE_PROVIDER_SITE_OTHER): Payer: PRIVATE HEALTH INSURANCE | Admitting: Family Medicine

## 2019-06-12 ENCOUNTER — Telehealth: Payer: Self-pay | Admitting: Obstetrics and Gynecology

## 2019-06-12 DIAGNOSIS — U071 COVID-19: Secondary | ICD-10-CM

## 2019-06-12 DIAGNOSIS — R5381 Other malaise: Secondary | ICD-10-CM | POA: Diagnosis not present

## 2019-06-12 NOTE — Telephone Encounter (Signed)
Patient canceled her upcoming PUS appointment 06/15/19. She did not wish to reschedule at this time.

## 2019-06-12 NOTE — Progress Notes (Signed)
Spoke to patient and adv Dr. Nolon Rod wanted her to come in and pick up COVID self test 07/22

## 2019-06-12 NOTE — Progress Notes (Signed)
Telemedicine Encounter- SOAP NOTE Established Patient  This telephone encounter was conducted with the patient's (or proxy's) verbal consent via audio telecommunications: yes/no: Yes Patient was instructed to have this encounter in a suitably private space; and to only have persons present to whom they give permission to participate. In addition, patient identity was confirmed by use of name plus two identifiers (DOB and address).  I discussed the limitations, risks, security and privacy concerns of performing an evaluation and management service by telephone and the availability of in person appointments. I also discussed with the patient that there may be a patient responsible charge related to this service. The patient expressed understanding and agreed to proceed.  I spent a total of TIME; 0 MIN TO 60 MIN: 15 minutes talking with the patient or their proxy.  CC: covid retesting Subjective   Beth Frost is a 48 y.o. established patient. 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 She continues to have fatigue and headaches. She is not eating her regular meals due to poor appetite and sense of taste.   She states that she has not sick contacts in the house Her family who live in the house got tested and they are asymptomatic  She reports that her employer requires that she be tested  before return to work and needs to be afebrile for 3 days   Patient Active Problem List   Diagnosis Date Noted  . Fever, unspecified 06/01/2019  . Malaise 06/01/2019  . Acute appendicitis 11/04/2017  . Essential hypertension 11/26/2016    Class: Family History of  . Herpes genitalis in women 07/18/2014    Class: History of    Past Medical History:  Diagnosis Date  . Abnormal Pap smear    many yrs ago, 2020 LGSIL  . Asthma   . Depression   . Hypertension   . STD (sexually transmitted disease)    HSV2    Current Outpatient Medications  Medication Sig Dispense Refill  . albuterol (PROVENTIL HFA;VENTOLIN HFA) 108 (90 Base) MCG/ACT inhaler Inhale 2 puffs into the lungs every 6 (six) hours as needed for wheezing or shortness of breath. (Patient not taking: Reported on 06/01/2019) 1 Inhaler 0  . amLODipine (NORVASC) 5 MG tablet Take 1 tablet (5 mg total) by mouth daily. 90 tablet 3  . ibuprofen (ADVIL,MOTRIN) 200 MG tablet Take 200 mg by mouth as needed.    . Multiple Vitamins-Minerals (MULTIVITAMIN PO) Take 1 tablet by mouth daily as needed (supplemental).     . norethindrone (MICRONOR,CAMILA,ERRIN) 0.35 MG tablet Take 1 tablet (0.35 mg total) by mouth daily. 3 Package 4  . phentermine (ADIPEX-P) 37.5 MG tablet Take 37.5 mg by mouth daily.    . valACYclovir (VALTREX) 1000 MG tablet Take one daily at onset of outbreak 45 tablet 12   No current facility-administered medications for this  visit.     Allergies  Allergen Reactions  . Flagyl [Metronidazole]     Itchy all over    Social History   Socioeconomic History  . Marital status: Single    Spouse name: Not on file  . Number of children: Not on file  . Years of education: Not on file  . Highest education level: Not on file  Occupational History  . Not on file  Social Needs  . Financial resource strain: Not on file  . Food insecurity    Worry: Not on file    Inability: Not on file  . Transportation needs    Medical:  Not on file    Non-medical: Not on file  Tobacco Use  . Smoking status: Never Smoker  . Smokeless tobacco: Never Used  Substance and Sexual Activity  . Alcohol use: Yes    Alcohol/week: 3.0 standard drinks    Types: 3 Standard drinks or equivalent per week  . Drug use: No  . Sexual activity: Yes    Partners: Male    Birth control/protection: Pill  Lifestyle  . Physical activity    Days per week: Not on file    Minutes per session: Not on file  . Stress: Not on file  Relationships  . Social Herbalist on phone: Not on file    Gets together: Not on file    Attends religious service: Not on file    Active member of club or organization: Not on file    Attends meetings of clubs or organizations: Not on file    Relationship status: Not on file  . Intimate partner violence    Fear of current or ex partner: Not on file    Emotionally abused: Not on file    Physically abused: Not on file    Forced sexual activity: Not on file  Other Topics Concern  . Not on file  Social History Narrative  . Not on file    ROS Review of Systems   -  No wheezing,coughing or shortness of breath -  No skin rash - no diarrhea or vomiting   Objective   Vitals as reported by the patient: There were no vitals filed for this visit.  Diagnoses and all orders for this visit:  COVID-19 virus infection -     Novel Coronavirus, NAA ([facility]); Future  Malaise   Advised pt to follow up to clinic to get retesting to return to work on 06/14/19  I discussed the assessment and treatment plan with the patient. The patient was provided an opportunity to ask questions and all were answered. The patient agreed with the plan and demonstrated an understanding of the instructions.   The patient was advised to call back or seek an in-person evaluation if the symptoms worsen or if the condition fails to improve as anticipated.  I provided 15 minutes of non-face-to-face time during this encounter.   Forrest Moron, MD  Primary Care at [facility]

## 2019-06-13 ENCOUNTER — Encounter: Payer: Self-pay | Admitting: Obstetrics and Gynecology

## 2019-06-13 NOTE — Telephone Encounter (Signed)
Encounter reviewed and closed.  Patient choice to do ultrasound.

## 2019-06-14 ENCOUNTER — Ambulatory Visit: Payer: PRIVATE HEALTH INSURANCE

## 2019-06-14 ENCOUNTER — Other Ambulatory Visit: Payer: Self-pay

## 2019-06-14 ENCOUNTER — Other Ambulatory Visit: Payer: Self-pay | Admitting: Family Medicine

## 2019-06-14 DIAGNOSIS — Z20822 Contact with and (suspected) exposure to covid-19: Secondary | ICD-10-CM

## 2019-06-15 ENCOUNTER — Other Ambulatory Visit: Payer: PRIVATE HEALTH INSURANCE | Admitting: Obstetrics and Gynecology

## 2019-06-15 ENCOUNTER — Other Ambulatory Visit: Payer: PRIVATE HEALTH INSURANCE

## 2019-06-17 LAB — SPECIMEN STATUS REPORT

## 2019-06-17 LAB — NOVEL CORONAVIRUS, NAA: SARS-CoV-2, NAA: NOT DETECTED

## 2019-06-23 ENCOUNTER — Encounter: Payer: Self-pay | Admitting: Family Medicine

## 2019-06-28 ENCOUNTER — Telehealth: Payer: Self-pay

## 2019-06-28 ENCOUNTER — Telehealth (INDEPENDENT_AMBULATORY_CARE_PROVIDER_SITE_OTHER): Payer: PRIVATE HEALTH INSURANCE | Admitting: Family Medicine

## 2019-06-28 ENCOUNTER — Other Ambulatory Visit: Payer: Self-pay

## 2019-06-28 VITALS — Temp 98.2°F

## 2019-06-28 DIAGNOSIS — F4323 Adjustment disorder with mixed anxiety and depressed mood: Secondary | ICD-10-CM

## 2019-06-28 DIAGNOSIS — U071 COVID-19: Secondary | ICD-10-CM

## 2019-06-28 DIAGNOSIS — R51 Headache: Secondary | ICD-10-CM | POA: Diagnosis not present

## 2019-06-28 DIAGNOSIS — I10 Essential (primary) hypertension: Secondary | ICD-10-CM

## 2019-06-28 DIAGNOSIS — R519 Headache, unspecified: Secondary | ICD-10-CM

## 2019-06-28 MED ORDER — PROPRANOLOL HCL ER 60 MG PO CP24: 60.0000 mg | ORAL_CAPSULE | Freq: Every day | ORAL | 0 refills | Status: DC

## 2019-06-28 MED ORDER — CITALOPRAM HYDROBROMIDE 10 MG PO TABS: 10.0000 mg | ORAL_TABLET | Freq: Every day | ORAL | 3 refills | Status: DC

## 2019-06-28 NOTE — Progress Notes (Signed)
Telemedicine Encounter- SOAP NOTE Established Patient  This telephone encounter was conducted with the patient's (or proxy's) verbal consent via audio telecommunications: yes/no: Yes Patient was instructed to have this encounter in a suitably private space; and to only have persons present to whom they give permission to participate. In addition, patient identity was confirmed by use of name plus two identifiers (DOB and address).  I discussed the limitations, risks, security and privacy concerns of performing an evaluation and management service by telephone and the availability of in person appointments. I also discussed with the patient that there may be a patient responsible charge related to this service. The patient expressed understanding and agreed to proceed.  I spent a total of TIME; 0 MIN TO 60 MIN: 25 minutes talking with the patient or their proxy.  CC: covid recovery, return to work  Subjective   Beth Frost is a 48 y.o. established patient. Telephone visit today for  HPI  COVID- recovery and Headache  She reports that she had a second covid test on 06/14/2019 that was negative.  She states that she started having a headache in the front of her head in the frontal area about 2-3 weeks ago while she had COVID symptoms It is never radiating Not associated with eye pain No nausea The headache is dull Pain score of 6/10 She took ibuprofen 600mg  for the headache which eases her headache pain  There is no pain or stiffness in the neck She states that loud noises affect her She denies any history of sinuses or allergies   Hypertension: Patient here for follow-up of elevated blood pressure. She is not exercising and is adherent to low salt diet.  Blood pressure is well controlled at home.  Cardiac symptoms none. Patient denies chest pain, chest pressure/discomfort, dyspnea, exertional chest pressure/discomfort, fatigue, irregular heart beat and lower extremity edema.   Cardiovascular risk factors: hypertension. Use of agents associated with hypertension: NSAIDS. History of target organ damage: none. BP Readings from Last 3 Encounters:  05/08/19 122/68  02/28/19 123/73  01/20/19 120/80    Adjustment Disorder Pt reports that she has been having a very hard time with the idea of going back to work. She reports that she gets headaches, she has been stress eating. 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 She states that she has been feeling very nervous and it is also causing her to have depressed moods. She reports that her colleagues are supportive and she would consider them friends. She states that they have all had to be retested. She does not know how she will cope going back to work.  Depression screen [facility] 2/9 06/28/2019 06/12/2019 06/01/2019 02/28/2019 11/04/2017  Decreased Interest 0 0 0 0 0  Down, Depressed, Hopeless 2 0 0 1 0  PHQ - 2 Score 2 0 0 1 0  Altered sleeping 2 - - - -  Tired, decreased energy 2 - - - -  Change in appetite 2 - - - -  Feeling bad or failure about yourself  0 - - - -  Trouble concentrating 0 - - - -  Moving slowly or fidgety/restless 0 - - - -  Suicidal thoughts 0 - - - -  PHQ-9 Score 8 - - - -  Difficult doing work/chores Not difficult at all - - - -      Patient Active Problem List   Diagnosis Date Noted  . Fever, unspecified  06/01/2019  . Malaise 06/01/2019  . Acute appendicitis 11/04/2017  . Essential hypertension 11/26/2016    Class: Family History of  . Herpes genitalis in women 07/18/2014    Class: History of    Past Medical History:  Diagnosis Date  . Abnormal Pap smear    many yrs ago, 2020 LGSIL  . Asthma   . Depression   . Fibroids   . Hypertension   . STD (sexually transmitted disease)    HSV2  . Vaginal wall cyst     Current Outpatient Medications  Medication Sig Dispense Refill  . albuterol (PROVENTIL HFA;VENTOLIN HFA) 108 (90 Base) MCG/ACT  inhaler Inhale 2 puffs into the lungs every 6 (six) hours as needed for wheezing or shortness of breath. (Patient not taking: Reported on 06/01/2019) 1 Inhaler 0  . amLODipine (NORVASC) 5 MG tablet Take 1 tablet (5 mg total) by mouth daily. 90 tablet 3  . citalopram (CELEXA) 10 MG tablet Take 1 tablet (10 mg total) by mouth daily. 30 tablet 3  . ibuprofen (ADVIL,MOTRIN) 200 MG tablet Take 200 mg by mouth as needed.    . Multiple Vitamins-Minerals (MULTIVITAMIN PO) Take 1 tablet by mouth daily as needed (supplemental).     . norethindrone (MICRONOR,CAMILA,ERRIN) 0.35 MG tablet Take 1 tablet (0.35 mg total) by mouth daily. 3 Package 4  . phentermine (ADIPEX-P) 37.5 MG tablet Take 37.5 mg by mouth daily.    . propranolol ER (INDERAL LA) 60 MG 24 hr capsule Take 1 capsule (60 mg total) by mouth daily. For headaches 30 capsule 0  . valACYclovir (VALTREX) 1000 MG tablet Take one daily at onset of outbreak (Patient not taking: Reported on 06/12/2019) 45 tablet 12   No current facility-administered medications for this visit.     Allergies  Allergen Reactions  . Flagyl [Metronidazole]     Itchy all over    Social History   Socioeconomic History  . Marital status: Single    Spouse name: Not on file  . Number of children: Not on file  . Years of education: Not on file  . Highest education level: Not on file  Occupational History  . Not on file  Social Needs  . Financial resource strain: Not on file  . Food insecurity    Worry: Not on file    Inability: Not on file  . Transportation needs    Medical: Not on file    Non-medical: Not on file  Tobacco Use  . Smoking status: Never Smoker  . Smokeless tobacco: Never Used  Substance and Sexual Activity  . Alcohol use: Yes    Alcohol/week: 3.0 standard drinks    Types: 3 Standard drinks or equivalent per week  . Drug use: No  . Sexual activity: Yes    Partners: Male    Birth control/protection: Pill  Lifestyle  . Physical activity     Days per week: Not on file    Minutes per session: Not on file  . Stress: Not on file  Relationships  . Social Herbalist on phone: Not on file    Gets together: Not on file    Attends religious service: Not on file    Active member of club or organization: Not on file    Attends meetings of clubs or organizations: Not on file    Relationship status: Not on file  . Intimate partner violence    Fear of current or ex partner: Not on file  Emotionally abused: Not on file    Physically abused: Not on file    Forced sexual activity: Not on file  Other Topics Concern  . Not on file  Social History Narrative  . Not on file    ROS Review of Systems  Constitutional: Negative for activity change, appetite change, chills and fever.  HENT: Negative for congestion, nosebleeds, trouble swallowing and voice change.   Respiratory: Negative for cough, shortness of breath and wheezing.   Gastrointestinal: Negative for diarrhea, nausea and vomiting.  Genitourinary: Negative for difficulty urinating, dysuria, flank pain and hematuria.  Musculoskeletal: Negative for back pain, joint swelling and neck pain.  Neurological: Negative for dizziness, speech difficulty, light-headedness and numbness.  See HPI. All other review of systems negative.   Objective   Vitals as reported by the patient: Today's Vitals   06/28/19 1303  Temp: 98.2 F (36.8 C)  TempSrc: Oral    Diagnoses and all orders for this visit:  COVID-19 virus infection- patient has recovered and be cleared to return to work however she has been advised to get support and to wear her PPE and to avoid breakrooms  Adjustment disorder with mixed anxiety and depressed mood- discussed treatment for at least 3 months to help with the daily anxiety at work -     citalopram (CELEXA) 10 MG tablet; Take 1 tablet (10 mg total) by mouth daily.  Frontal headache - will treat with inderal LA, stress mgmt, celexa and NSAIDs   Essential hypertension- will start inderal LA for headache  Other orders -     propranolol ER (INDERAL LA) 60 MG 24 hr capsule; Take 1 capsule (60 mg total) by mouth daily. For headaches     I discussed the assessment and treatment plan with the patient. The patient was provided an opportunity to ask questions and all were answered. The patient agreed with the plan and demonstrated an understanding of the instructions.   The patient was advised to call back or seek an in-person evaluation if the symptoms worsen or if the condition fails to improve as anticipated.  I provided 25 minutes of non-face-to-face time during this encounter.  Forrest Moron, MD  Primary Care at [facility]

## 2019-06-28 NOTE — Telephone Encounter (Signed)
Pt came in for bp chk 129/83

## 2019-07-20 ENCOUNTER — Telehealth: Payer: Self-pay

## 2019-07-20 NOTE — Telephone Encounter (Signed)
Pt left without after visit summary and covid 19 return to work note. Sending this to pt via mail. Dgaddy, CMA

## 2019-08-07 ENCOUNTER — Other Ambulatory Visit: Payer: Self-pay | Admitting: Family Medicine

## 2019-11-03 ENCOUNTER — Other Ambulatory Visit: Payer: Self-pay

## 2019-11-03 DIAGNOSIS — Z20822 Contact with and (suspected) exposure to covid-19: Secondary | ICD-10-CM

## 2019-11-05 LAB — NOVEL CORONAVIRUS, NAA: SARS-CoV-2, NAA: NOT DETECTED

## 2019-11-21 ENCOUNTER — Encounter: Payer: Self-pay | Admitting: Certified Nurse Midwife

## 2019-12-14 ENCOUNTER — Other Ambulatory Visit: Payer: Self-pay

## 2019-12-14 ENCOUNTER — Ambulatory Visit: Payer: PRIVATE HEALTH INSURANCE | Admitting: Certified Nurse Midwife

## 2019-12-15 NOTE — Progress Notes (Signed)
49 y.o. [id number] Single  African American Fe here for annual exam. Periods normal  with POP use. Denies warning signs with use. Has taken one Covid vaccine with some nausea and soreness. Occasional HSV outbreak and has Valtrex to use, needs update. STD screening requested. Complaining of urinary frequency and incomplete emptying for the past two days. Denies fever, chills or back ache. Desires STD screening. No other health issues today. Sees PCP for labs and hypertension, albuterol and Celexa management. All stable per patient.   No LMP recorded.          Sexually active: Yes.    The current method of family planning is oral progesterone-only contraceptive.    Exercising: No.  exercise Smoker:  no  Review of Systems  Constitutional: Negative.   HENT: Negative.   Eyes: Negative.   Respiratory: Negative.   Cardiovascular: Negative.   Gastrointestinal: Negative.   Genitourinary:       Voiding small amount of urine, discomfort  Musculoskeletal: Negative.   Skin: Negative.   Neurological: Negative.   Endo/Heme/Allergies: Negative.   Psychiatric/Behavioral: Negative.     Health Maintenance: Pap:  11-26-16 neg HPV HR neg, 12-09-2018 LGSIL HPV HR neg History of Abnormal Pap: yes MMG:  2020 neg per patient Self Breast exams: occ Colonoscopy:  none BMD:   none TDaP:  2014 Shingles: no Pneumonia: no Hep C and HIV: HIV neg 2018 Labs: poct urine-rbc tr, wbc 2+, protein+   reports that she has never smoked. She has never used smokeless tobacco. She reports current alcohol use of about 3.0 standard drinks of alcohol per week. She reports that she does not use drugs.  Past Medical History:  Diagnosis Date  . Abnormal Pap smear    many yrs ago, 2020 LGSIL  . Asthma   . Depression   . Fibroids   . Hypertension   . STD (sexually transmitted disease)    HSV2  . Vaginal wall cyst     Past Surgical History:  Procedure Laterality Date  . CHOLECYSTECTOMY N/A 05/11/2014   Procedure:  LAPAROSCOPIC CHOLECYSTECTOMY WITH INTRAOPERATIVE CHOLANGIOGRAM;  Surgeon: Zenovia Jarred, MD;  Location: [place];  Service: General;  Laterality: N/A;  . CHOLECYSTECTOMY  04/2014  . COLPOSCOPY    . CRYOTHERAPY     for abnormal pap  . DILATION AND CURETTAGE OF UTERUS    . LAPAROSCOPIC APPENDECTOMY N/A 11/05/2017   Procedure: APPENDECTOMY LAPAROSCOPIC;  Surgeon: Coralie Keens, MD;  Location: [place];  Service: General;  Laterality: N/A;    Current Outpatient Medications  Medication Sig Dispense Refill  . albuterol (PROVENTIL HFA;VENTOLIN HFA) 108 (90 Base) MCG/ACT inhaler Inhale 2 puffs into the lungs every 6 (six) hours as needed for wheezing or shortness of breath. (Patient not taking: Reported on 06/01/2019) 1 Inhaler 0  . amLODipine (NORVASC) 5 MG tablet Take 1 tablet (5 mg total) by mouth daily. 90 tablet 3  . citalopram (CELEXA) 10 MG tablet Take 1 tablet (10 mg total) by mouth daily. 30 tablet 3  . ibuprofen (ADVIL,MOTRIN) 200 MG tablet Take 200 mg by mouth as needed.    . Multiple Vitamins-Minerals (MULTIVITAMIN PO) Take 1 tablet by mouth daily as needed (supplemental).     . norethindrone (MICRONOR,CAMILA,ERRIN) 0.35 MG tablet Take 1 tablet (0.35 mg total) by mouth daily. 3 Package 4  . phentermine (ADIPEX-P) 37.5 MG tablet Take 37.5 mg by mouth daily.    . propranolol ER (INDERAL LA) 60 MG 24 hr capsule TAKE 1 CAPSULE(60  MG) BY MOUTH DAILY FOR HEADACHES 30 capsule 0  . valACYclovir (VALTREX) 1000 MG tablet Take one daily at onset of outbreak (Patient not taking: Reported on 06/12/2019) 45 tablet 12   No current facility-administered medications for this visit.    Family History  Problem Relation Age of Onset  . Diabetes Mother   . Cancer Mother        leukemia  . Lupus Mother   . Hypertension Mother   . Hypertension Father   . Breast cancer Maternal Aunt     ROS:  Pertinent items are noted in HPI.  Otherwise, a comprehensive ROS was negative.  Exam:   There were no  vitals taken for this visit.   Ht Readings from Last 3 Encounters:  06/01/19 5\' 2"  (1.575 m)  05/08/19 5\' 2"  (1.575 m)  02/28/19 5\' 2"  (1.575 m)    General appearance: alert, cooperative and appears stated age Head: Normocephalic, without obvious abnormality, atraumatic Neck: no adenopathy, supple, symmetrical, trachea midline and thyroid normal to inspection and palpation Lungs: clear to auscultation bilaterally Breasts: normal appearance, no masses or tenderness, No nipple retraction or dimpling, No nipple discharge or bleeding, No axillary or supraclavicular adenopathy Heart: regular rate and rhythm Abdomen: soft, non-tender; no masses,  no organomegaly Extremities: extremities normal, atraumatic, no cyanosis or edema Skin: Skin color, texture, turgor normal. No rashes or lesions Lymph nodes: Cervical, supraclavicular, and axillary nodes normal. No abnormal inguinal nodes palpated Neurologic: Grossly normal   Pelvic: External genitalia:  no lesions              Urethra:  normal appearing urethra with no masses, tenderness or lesions              Bartholin's and Skene's: normal                 Vagina: normal appearing vagina with normal color and discharge, no lesions              Cervix: no cervical motion tenderness, no lesions and retroverted              Pap taken: Yes.   Bimanual Exam:  Uterus:  normal size, contour, position, consistency, mobility, non-tender and anteverted              Adnexa: normal adnexa and no mass, fullness, tenderness               Rectovaginal: Confirms               Anus:  normal sphincter tone, no lesions  Chaperone present: yes  A:  Well Woman with normal exam  Contraception POP, denies warning signs with use.  UTI with Hematuria  Hypertension/celexa management with PCP  History of HSV needs Rx update  History of LSIL pap follow up today  STD screening  P:   Reviewed health and wellness pertinent to exam  Risks/benefits/warning signs with  POP use reviewed. Patient desires continuance.  Rx Micronor see order with instructions  Discussed UTI finding and need for medication.  Rx Macrobid 100 mg bid x 7 See order with instructions. Warning signs with UTI given  Lab: Urine micro/culture  Continue follow up with PCP as indicated  Rx Valtrex see order with instructions  Lab: Affirn, Gc,Chlamydia  Follow up pap smear today  Pap smear: yes   counseled on breast self exam, mammography screening, STD prevention, HIV risk factors and prevention, feminine hygiene, adequate intake of calcium and vitamin D,  diet and exercise  return annually or prn  An After Visit Summary was printed and given to the patient.

## 2019-12-18 ENCOUNTER — Other Ambulatory Visit (HOSPITAL_COMMUNITY)
Admission: RE | Admit: 2019-12-18 | Discharge: 2019-12-18 | Disposition: A | Discharge status: Home / Self Care | Payer: PRIVATE HEALTH INSURANCE | Source: Ambulatory Visit | Attending: Certified Nurse Midwife | Admitting: Certified Nurse Midwife

## 2019-12-18 ENCOUNTER — Encounter: Payer: Self-pay | Admitting: Certified Nurse Midwife

## 2019-12-18 ENCOUNTER — Other Ambulatory Visit: Payer: Self-pay

## 2019-12-18 ENCOUNTER — Ambulatory Visit (INDEPENDENT_AMBULATORY_CARE_PROVIDER_SITE_OTHER): Payer: PRIVATE HEALTH INSURANCE | Admitting: Certified Nurse Midwife

## 2019-12-18 VITALS — BP 110/78 | HR 68 | Temp 97.5°F | Resp 16 | Ht 61.75 in | Wt 175.0 lb

## 2019-12-18 DIAGNOSIS — R87612 Low grade squamous intraepithelial lesion on cytologic smear of cervix (LGSIL): Secondary | ICD-10-CM

## 2019-12-18 DIAGNOSIS — Z3041 Encounter for surveillance of contraceptive pills: Secondary | ICD-10-CM

## 2019-12-18 DIAGNOSIS — Z113 Encounter for screening for infections with a predominantly sexual mode of transmission: Secondary | ICD-10-CM | POA: Insufficient documentation

## 2019-12-18 DIAGNOSIS — Z01411 Encounter for gynecological examination (general) (routine) with abnormal findings: Secondary | ICD-10-CM | POA: Diagnosis not present

## 2019-12-18 DIAGNOSIS — N390 Urinary tract infection, site not specified: Secondary | ICD-10-CM

## 2019-12-18 DIAGNOSIS — Z0000 Encounter for general adult medical examination without abnormal findings: Secondary | ICD-10-CM

## 2019-12-18 DIAGNOSIS — A6009 Herpesviral infection of other urogenital tract: Secondary | ICD-10-CM | POA: Diagnosis not present

## 2019-12-18 DIAGNOSIS — R319 Hematuria, unspecified: Secondary | ICD-10-CM

## 2019-12-18 LAB — POCT URINALYSIS DIPSTICK
Bilirubin, UA: NEGATIVE
Glucose, UA: NEGATIVE
Ketones, UA: NEGATIVE
Nitrite, UA: NEGATIVE
Protein, UA: POSITIVE — AB
Urobilinogen, UA: NEGATIVE E.U./dL — AB
pH, UA: 5 (ref 5.0–8.0)

## 2019-12-18 MED ORDER — VALACYCLOVIR HCL 1 G PO TABS: ORAL_TABLET | ORAL | 12 refills | Status: DC

## 2019-12-18 MED ORDER — NORETHINDRONE 0.35 MG PO TABS: 1.0000 | ORAL_TABLET | Freq: Every day | ORAL | 4 refills | Status: DC

## 2019-12-18 MED ORDER — NITROFURANTOIN MONOHYD MACRO 100 MG PO CAPS: 100.0000 mg | ORAL_CAPSULE | Freq: Two times a day (BID) | ORAL | 0 refills | Status: DC

## 2019-12-18 NOTE — Patient Instructions (Signed)
EXERCISE AND DIET:  We recommended that you start or continue a regular exercise program for good health. Regular exercise means any activity that makes your heart beat faster and makes you sweat.  We recommend exercising at least 30 minutes per day at least 3 days a week, preferably 4 or 5.  We also recommend a diet low in fat and sugar.  Inactivity, poor dietary choices and obesity can cause diabetes, heart attack, stroke, and kidney damage, among others.    ALCOHOL AND SMOKING:  Women should limit their alcohol intake to no more than 7 drinks/beers/glasses of wine (combined, not each!) per week. Moderation of alcohol intake to this level decreases your risk of breast cancer and liver damage. And of course, no recreational drugs are part of a healthy lifestyle.  And absolutely no smoking or even second hand smoke. Most people know smoking can cause heart and lung diseases, but did you know it also contributes to weakening of your bones? Aging of your skin?  Yellowing of your teeth and nails?  CALCIUM AND VITAMIN D:  Adequate intake of calcium and Vitamin D are recommended.  The recommendations for exact amounts of these supplements seem to change often, but generally speaking 600 mg of calcium (either carbonate or citrate) and 800 units of Vitamin D per day seems prudent. Certain women may benefit from higher intake of Vitamin D.  If you are among these women, your doctor will have told you during your visit.    PAP SMEARS:  Pap smears, to check for cervical cancer or precancers,  have traditionally been done yearly, although recent scientific advances have shown that most women can have pap smears less often.  However, every woman still should have a physical exam from her gynecologist every year. It will include a breast check, inspection of the vulva and vagina to check for abnormal growths or skin changes, a visual exam of the cervix, and then an exam to evaluate the size and shape of the uterus and  ovaries.  And after 47 years of age, a rectal exam is indicated to check for rectal cancers. We will also provide age appropriate advice regarding health maintenance, like when you should have certain vaccines, screening for sexually transmitted diseases, bone density testing, colonoscopy, mammograms, etc.   MAMMOGRAMS:  All women over 40 years old should have a yearly mammogram. Many facilities now offer a "3D" mammogram, which may cost around $50 extra out of pocket. If possible,  we recommend you accept the option to have the 3D mammogram performed.  It both reduces the number of women who will be called back for extra views which then turn out to be normal, and it is better than the routine mammogram at detecting truly abnormal areas.    COLONOSCOPY:  Colonoscopy to screen for colon cancer is recommended for all women at age 50.  We know, you hate the idea of the prep.  We agree, BUT, having colon cancer and not knowing it is worse!!  Colon cancer so often starts as a polyp that can be seen and removed at colonscopy, which can quite literally save your life!  And if your first colonoscopy is normal and you have no family history of colon cancer, most women don't have to have it again for 10 years.  Once every ten years, you can do something that may end up saving your life, right?  We will be happy to help you get it scheduled when you are ready.    Be sure to check your insurance coverage so you understand how much it will cost.  It may be covered as a preventative service at no cost, but you should check your particular policy.       Urinary Tract Infection, Adult A urinary tract infection (UTI) is an infection of any part of the urinary tract. The urinary tract includes:  The kidneys.  The ureters.  The bladder.  The urethra. These organs make, store, and get rid of pee (urine) in the body. What are the causes? This is caused by germs (bacteria) in your genital area. These germs grow and  cause swelling (inflammation) of your urinary tract. What increases the risk? You are more likely to develop this condition if:  You have a small, thin tube (catheter) to drain pee.  You cannot control when you pee or poop (incontinence).  You are female, and: ? You use these methods to prevent pregnancy:  A medicine that kills sperm (spermicide).  A device that blocks sperm (diaphragm). ? You have low levels of a female hormone (estrogen). ? You are pregnant.  You have genes that add to your risk.  You are sexually active.  You take antibiotic medicines.  You have trouble peeing because of: ? A prostate that is bigger than normal, if you are female. ? A blockage in the part of your body that drains pee from the bladder (urethra). ? A kidney stone. ? A nerve condition that affects your bladder (neurogenic bladder). ? Not getting enough to drink. ? Not peeing often enough.  You have other conditions, such as: ? Diabetes. ? A weak disease-fighting system (immune system). ? Sickle cell disease. ? Gout. ? Injury of the spine. What are the signs or symptoms? Symptoms of this condition include:  Needing to pee right away (urgently).  Peeing often.  Peeing small amounts often.  Pain or burning when peeing.  Blood in the pee.  Pee that smells bad or not like normal.  Trouble peeing.  Pee that is cloudy.  Fluid coming from the vagina, if you are female.  Pain in the belly or lower back. Other symptoms include:  Throwing up (vomiting).  No urge to eat.  Feeling mixed up (confused).  Being tired and grouchy (irritable).  A fever.  Watery poop (diarrhea). How is this treated? This condition may be treated with:  Antibiotic medicine.  Other medicines.  Drinking enough water. Follow these instructions at home:  Medicines  Take over-the-counter and prescription medicines only as told by your doctor.  If you were prescribed an antibiotic medicine,  take it as told by your doctor. Do not stop taking it even if you start to feel better. General instructions  Make sure you: ? Pee until your bladder is empty. ? Do not hold pee for a long time. ? Empty your bladder after sex. ? Wipe from front to back after pooping if you are a female. Use each tissue one time when you wipe.  Drink enough fluid to keep your pee pale yellow.  Keep all follow-up visits as told by your doctor. This is important. Contact a doctor if:  You do not get better after 1-2 days.  Your symptoms go away and then come back. Get help right away if:  You have very bad back pain.  You have very bad pain in your lower belly.  You have a fever.  You are sick to your stomach (nauseous).  You are throwing up. Summary  A urinary tract infection (UTI) is an infection of any part of the urinary tract.  This condition is caused by germs in your genital area.  There are many risk factors for a UTI. These include having a small, thin tube to drain pee and not being able to control when you pee or poop.  Treatment includes antibiotic medicines for germs.  Drink enough fluid to keep your pee pale yellow. This information is not intended to replace advice given to you by your health care provider. Make sure you discuss any questions you have with your health care provider. Document Revised: 10/27/2018 Document Reviewed: 05/19/2018 Elsevier Patient Education  2020 [employer].

## 2019-12-19 ENCOUNTER — Other Ambulatory Visit: Payer: Self-pay

## 2019-12-19 LAB — VAGINITIS/VAGINOSIS, DNA PROBE
Candida Species: NEGATIVE
Gardnerella vaginalis: POSITIVE — AB
Trichomonas vaginosis: NEGATIVE

## 2019-12-19 LAB — URINALYSIS, MICROSCOPIC ONLY
Casts: NONE SEEN /lpf
WBC, UA: >30 /hpf — AB (ref 0–5)

## 2019-12-19 MED ORDER — CLINDAMYCIN PHOSPHATE 2 % VA CREA: 1.0000 | TOPICAL_CREAM | Freq: Every day | VAGINAL | 0 refills | Status: AC

## 2019-12-20 LAB — CYTOLOGY - PAP
Adequacy: ABSENT
Chlamydia: NEGATIVE
Comment: NEGATIVE
Comment: NEGATIVE
Comment: NORMAL
Diagnosis: NEGATIVE
High risk HPV: NEGATIVE
Neisseria Gonorrhea: NEGATIVE

## 2019-12-20 LAB — URINE CULTURE

## 2019-12-22 ENCOUNTER — Other Ambulatory Visit: Payer: Self-pay | Admitting: Certified Nurse Midwife

## 2019-12-22 DIAGNOSIS — A6009 Herpesviral infection of other urogenital tract: Secondary | ICD-10-CM

## 2019-12-29 ENCOUNTER — Other Ambulatory Visit: Payer: Self-pay | Admitting: Certified Nurse Midwife

## 2019-12-29 DIAGNOSIS — Z3041 Encounter for surveillance of contraceptive pills: Secondary | ICD-10-CM

## 2020-01-02 ENCOUNTER — Other Ambulatory Visit: Payer: Self-pay | Admitting: Family Medicine

## 2020-01-02 DIAGNOSIS — I10 Essential (primary) hypertension: Secondary | ICD-10-CM

## 2020-01-26 ENCOUNTER — Other Ambulatory Visit: Payer: Self-pay | Admitting: Family Medicine

## 2020-01-26 DIAGNOSIS — F4323 Adjustment disorder with mixed anxiety and depressed mood: Secondary | ICD-10-CM

## 2020-01-26 NOTE — Telephone Encounter (Signed)
Requested  medications are  due for refill today yes  Requested medications are on the active medication list yes  Last refill 12/21/2018  Future visit scheduled no  Notes to clinic failed protocol of visit within 6 months

## 2020-01-31 ENCOUNTER — Telehealth: Payer: Self-pay | Admitting: Family Medicine

## 2020-01-31 NOTE — Telephone Encounter (Signed)
error 

## 2020-02-02 ENCOUNTER — Ambulatory Visit: Payer: PRIVATE HEALTH INSURANCE | Admitting: Family Medicine

## 2020-02-09 ENCOUNTER — Encounter: Payer: Self-pay | Admitting: Certified Nurse Midwife

## 2020-02-14 ENCOUNTER — Other Ambulatory Visit: Payer: Self-pay | Admitting: Family Medicine

## 2020-02-14 DIAGNOSIS — I10 Essential (primary) hypertension: Secondary | ICD-10-CM

## 2020-02-14 NOTE — Telephone Encounter (Signed)
Requested Prescriptions  Pending Prescriptions Disp Refills  . amLODipine (NORVASC) 5 MG tablet [Pharmacy Med Name: AMLODIPINE BESYLATE 5MG  TABLETS] 10 tablet 0    Sig: TAKE 1 TABLET(5 MG) BY MOUTH DAILY     Cardiovascular:  Calcium Channel Blockers Failed - 02/14/2020  6:34 AM      Failed - Valid encounter within last 6 months    Recent Outpatient Visits          7 months ago COVID-19 virus infection   [facility] at [place], MD   8 months ago COVID-19 virus infection   Primary Care at [facility], Arlie Solomons, MD   8 months ago Fever, unspecified   [facility] at [facility], Rex Kras, MD   11 months ago Essential hypertension   [facility] at [place], MD   11 months ago Essential hypertension   [facility] at [place], MD      Future Appointments            In 5 days Forrest Moron, MD Primary Care at [place], [place] BP in normal range    BP Readings from Last 1 Encounters:  12/18/19 110/78         Providing enough medication to get her to her office visit.

## 2020-02-19 ENCOUNTER — Encounter: Payer: Self-pay | Admitting: Family Medicine

## 2020-02-19 ENCOUNTER — Ambulatory Visit: Payer: PRIVATE HEALTH INSURANCE | Admitting: Family Medicine

## 2020-02-19 ENCOUNTER — Other Ambulatory Visit: Payer: Self-pay

## 2020-02-19 VITALS — BP 115/70 | HR 80 | Temp 98.1°F | Ht 62.0 in | Wt 185.0 lb

## 2020-02-19 DIAGNOSIS — R5382 Chronic fatigue, unspecified: Secondary | ICD-10-CM

## 2020-02-19 DIAGNOSIS — R7301 Impaired fasting glucose: Secondary | ICD-10-CM | POA: Diagnosis not present

## 2020-02-19 DIAGNOSIS — D649 Anemia, unspecified: Secondary | ICD-10-CM

## 2020-02-19 DIAGNOSIS — Z7289 Other problems related to lifestyle: Secondary | ICD-10-CM

## 2020-02-19 DIAGNOSIS — I10 Essential (primary) hypertension: Secondary | ICD-10-CM

## 2020-02-19 DIAGNOSIS — F4323 Adjustment disorder with mixed anxiety and depressed mood: Secondary | ICD-10-CM

## 2020-02-19 DIAGNOSIS — R0683 Snoring: Secondary | ICD-10-CM

## 2020-02-19 DIAGNOSIS — R635 Abnormal weight gain: Secondary | ICD-10-CM

## 2020-02-19 DIAGNOSIS — Z789 Other specified health status: Secondary | ICD-10-CM

## 2020-02-19 LAB — POCT CBC
Granulocyte percent: 65.7 %G (ref 37–80)
HCT, POC: 39.9 % (ref 29–41)
Hemoglobin: 12.7 g/dL (ref 11–14.6)
Lymph, poc: 2.5 (ref 0.6–3.4)
MCH, POC: 28.2 pg (ref 27–31.2)
MCHC: 31.7 g/dL — AB (ref 31.8–35.4)
MCV: 89 fL (ref 76–111)
MID (cbc): 0.2 (ref 0–0.9)
MPV: 7.8 fL (ref 0–99.8)
POC Granulocyte: 5.1 (ref 2–6.9)
POC LYMPH PERCENT: 31.9 %L (ref 10–50)
POC MID %: 2.4 %M (ref 0–12)
Platelet Count, POC: 300 10*3/uL (ref 142–424)
RBC: 4.48 M/uL (ref 4.04–5.48)
RDW, POC: 14 %
WBC: 7.7 10*3/uL (ref 4.6–10.2)

## 2020-02-19 LAB — POCT GLYCOSYLATED HEMOGLOBIN (HGB A1C): Hemoglobin A1C: 5.3 % (ref 4.0–5.6)

## 2020-02-19 MED ORDER — CITALOPRAM HYDROBROMIDE 10 MG PO TABS: ORAL_TABLET | ORAL | 3 refills | Status: DC

## 2020-02-19 MED ORDER — AMLODIPINE BESYLATE 5 MG PO TABS: 5.0000 mg | ORAL_TABLET | Freq: Every day | ORAL | 3 refills | Status: DC

## 2020-02-19 NOTE — Patient Instructions (Addendum)
If you have lab work done today you will be contacted with your lab results within the next 2 weeks.  If you have not heard from [place] then please contact us. The fastest way to get your results is to register for My Chart.   IF you received an x-ray today, you will receive an invoice from [facility]. Please contact [facility] at [phone] with questions or concerns regarding your invoice.   IF you received labwork today, you will receive an invoice from [facility]. Please contact [facility] at [phone] with questions or concerns regarding your invoice.   Our billing staff will not be able to assist you with questions regarding bills from these companies.  You will be contacted with the lab results as soon as they are available. The fastest way to get your results is to activate your My Chart account. Instructions are located on the last page of this paperwork. If you have not heard from [place] regarding the results in 2 weeks, please contact this office.     Preventing Unhealthy Weight Gain, Adult Staying at a healthy weight is important to your overall health. When fat builds up in your body, you may become overweight or obese. Being overweight or obese increases your risk of developing certain health problems, such as heart disease, diabetes, sleeping problems, joint problems, and some types of cancer. Unhealthy weight gain is often the result of making unhealthy food choices or not getting enough exercise. You can make changes to your lifestyle to prevent obesity and stay as healthy as possible. What nutrition changes can be made?   Eat only as much as your body needs. To do this: ? Pay attention to signs that you are hungry or full. Stop eating as soon as you feel full. ? If you feel hungry, try drinking water first before eating. Drink enough water so your urine is clear or pale yellow. ? Eat smaller portions. Pay attention to portion sizes when eating out. ? Look  at serving sizes on food labels. Most foods contain more than one serving per container. ? Eat the recommended number of calories for your gender and activity level. For most active people, a daily total of 2,000 calories is appropriate. If you are trying to lose weight or are not very active, you may need to eat fewer calories. Talk with your health care provider or a diet and nutrition specialist (dietitian) about how many calories you need each day.  Choose healthy foods, such as: ? Fruits and vegetables. At each meal, try to fill at least half of your plate with fruits and vegetables. ? Whole grains, such as whole-wheat bread, brown rice, and quinoa. ? Lean meats, such as chicken or fish. ? Other healthy proteins, such as beans, eggs, or tofu. ? Healthy fats, such as nuts, seeds, fatty fish, and olive oil. ? Low-fat or fat-free dairy products.  Check food labels, and avoid food and drinks that: ? Are high in calories. ? Have added sugar. ? Are high in sodium. ? Have saturated fats or trans fats.  Cook foods in healthier ways, such as by baking, broiling, or grilling.  Make a meal plan for the week, and shop with a grocery list to help you stay on track with your purchases. Try to avoid going to the grocery store when you are hungry.  When grocery shopping, try to shop around the outside of the store first, where the fresh foods are. Doing this helps you  to avoid prepackaged foods, which can be high in sugar, salt (sodium), and fat. What lifestyle changes can be made?   Exercise for 30 or more minutes on 5 or more days each week. Exercising may include brisk walking, yard work, biking, running, swimming, and team sports like basketball and soccer. Ask your health care provider which exercises are safe for you.  Do muscle-strengthening activities, such as lifting weights or using resistance bands, on 2 or more days a week.  Do not use any products that contain nicotine or tobacco, such  as cigarettes and e-cigarettes. If you need help quitting, ask your health care provider.  Limit alcohol intake to no more than 1 drink a day for nonpregnant women and 2 drinks a day for men. One drink equals 12 oz of beer, 5 oz of wine, or 1 oz of hard liquor.  Try to get 7-9 hours of sleep each night. What other changes can be made?  Keep a food and activity journal to keep track of: ? What you ate and how many calories you had. Remember to count the calories in sauces, dressings, and side dishes. ? Whether you were active, and what exercises you did. ? Your calorie, weight, and activity goals.  Check your weight regularly. Track any changes. If you notice you have gained weight, make changes to your diet or activity routine.  Avoid taking weight-loss medicines or supplements. Talk to your health care provider before starting any new medicine or supplement.  Talk to your health care provider before trying any new diet or exercise plan. Why are these changes important? Eating healthy, staying active, and having healthy habits can help you to prevent obesity. Those changes also:  Help you manage stress and emotions.  Help you connect with friends and family.  Improve your self-esteem.  Improve your sleep.  Prevent long-term health problems. What can happen if changes are not made? Being obese or overweight can cause you to develop joint or bone problems, which can make it hard for you to stay active or do activities you enjoy. Being obese or overweight also puts stress on your heart and lungs and can lead to health problems like diabetes, heart disease, and some cancers. Where to find more information Talk with your health care provider or a dietitian about healthy eating and healthy lifestyle choices. You may also find information from:  U.S. Department of Agriculture, MyPlate: FormerBoss.no  American Heart Association: www.heart.org  Centers for Disease Control and  Prevention: http://www.wolf.info/ Summary  Staying at a healthy weight is important to your overall health. It helps you to prevent certain diseases and health problems, such as heart disease, diabetes, joint problems, sleep disorders, and some types of cancer.  Being obese or overweight can cause you to develop joint or bone problems, which can make it hard for you to stay active or do activities you enjoy.  You can prevent unhealthy weight gain by eating a healthy diet, exercising regularly, not smoking, limiting alcohol, and getting enough sleep.  Talk with your health care provider or a dietitian for guidance about healthy eating and healthy lifestyle choices. This information is not intended to replace advice given to you by your health care provider. Make sure you discuss any questions you have with your health care provider. Document Revised: 11/12/2017 Document Reviewed: 12/16/2016 Elsevier Patient Education  2020 [employer].

## 2020-02-19 NOTE — Progress Notes (Signed)
Established Patient Office Visit  Subjective:  Patient ID: Beth Frost, female    DOB: Feb 13, 1971  Age: 49 y.o. MRN: [id number]  CC:  Chief Complaint  Patient presents with  . Medication Refill    on Celexa and amlodipine.     HPI Beth Frost presents for   She reports that she is low energy She states she gets 7 hours of sleep a night She reports that this started in the last couple of months She is not exercising and has a "terrible diet"  Wt Readings from Last 3 Encounters:  02/19/20 185 lb (83.9 kg)  12/18/19 175 lb (79.4 kg)  06/01/19 166 lb (75.3 kg)     She reports that her mood is anxious sometimes She doe snot spend a lot of time worrying She drinks about 12 oz of wine minimum every She states that she does not know why she drinks every day. She is also drinking energy drinks every day.  She tries to do this for more energy. Her boyfriend tells her that she snores. She feels tired almost every day.  Past Medical History:  Diagnosis Date  . Abnormal Pap smear    many yrs ago, 2020 LGSIL  . Asthma   . Depression   . Fibroids   . Hypertension   . STD (sexually transmitted disease)    HSV2  . Vaginal wall cyst     Past Surgical History:  Procedure Laterality Date  . CHOLECYSTECTOMY N/A 05/11/2014   Procedure: LAPAROSCOPIC CHOLECYSTECTOMY WITH INTRAOPERATIVE CHOLANGIOGRAM;  Surgeon: Zenovia Jarred, MD;  Location: [facility];  Service: General;  Laterality: N/A;  . CHOLECYSTECTOMY  04/2014  . COLPOSCOPY    . CRYOTHERAPY     for abnormal pap  . DILATION AND CURETTAGE OF UTERUS    . LAPAROSCOPIC APPENDECTOMY N/A 11/05/2017   Procedure: APPENDECTOMY LAPAROSCOPIC;  Surgeon: Coralie Keens, MD;  Location: [facility] OR;  Service: General;  Laterality: N/A;    Family History  Problem Relation Age of Onset  . Diabetes Mother   . Cancer Mother        leukemia  . Lupus Mother   . Hypertension Mother   . Hypertension Father   . Breast cancer Maternal  Aunt     Social History   Socioeconomic History  . Marital status: Single    Spouse name: Not on file  . Number of children: Not on file  . Years of education: Not on file  . Highest education level: Not on file  Occupational History  . Not on file  Tobacco Use  . Smoking status: Never Smoker  . Smokeless tobacco: Never Used  Substance and Sexual Activity  . Alcohol use: Yes    Alcohol/week: 3.0 standard drinks    Types: 3 Standard drinks or equivalent per week  . Drug use: No  . Sexual activity: Yes    Partners: Male    Birth control/protection: Pill  Other Topics Concern  . Not on file  Social History Narrative  . Not on file   Social Determinants of Health   Financial Resource Strain:   . Difficulty of Paying Living Expenses:   Food Insecurity:   . Worried About Charity fundraiser in the Last Year:   . Arboriculturist in the Last Year:   Transportation Needs:   . Film/video editor (Medical):   Marland Kitchen Lack of Transportation (Non-Medical):   Physical Activity:   . Days of Exercise per Week:   .  Minutes of Exercise per Session:   Stress:   . Feeling of Stress :   Social Connections:   . Frequency of Communication with Friends and Family:   . Frequency of Social Gatherings with Friends and Family:   . Attends Religious Services:   . Active Member of Clubs or Organizations:   . Attends Archivist Meetings:   Marland Kitchen Marital Status:   Intimate Partner Violence:   . Fear of Current or Ex-Partner:   . Emotionally Abused:   Marland Kitchen Physically Abused:   . Sexually Abused:     Outpatient Medications Prior to Visit  Medication Sig Dispense Refill  . albuterol (PROVENTIL HFA;VENTOLIN HFA) 108 (90 Base) MCG/ACT inhaler Inhale 2 puffs into the lungs every 6 (six) hours as needed for wheezing or shortness of breath. 1 Inhaler 0  . ibuprofen (ADVIL,MOTRIN) 200 MG tablet Take 200 mg by mouth as needed.    . Multiple Vitamins-Minerals (MULTIVITAMIN PO) Take 1 tablet by  mouth daily as needed (supplemental).     . nitrofurantoin, macrocrystal-monohydrate, (MACROBID) 100 MG capsule Take 1 capsule (100 mg total) by mouth 2 (two) times daily. 14 capsule 0  . norethindrone (MICRONOR) 0.35 MG tablet Take 1 tablet (0.35 mg total) by mouth daily. 3 Package 4  . valACYclovir (VALTREX) 1000 MG tablet Take one daily at onset of outbreak 45 tablet 12  . amLODipine (NORVASC) 5 MG tablet TAKE 1 TABLET(5 MG) BY MOUTH DAILY 10 tablet 0  . citalopram (CELEXA) 10 MG tablet TAKE 1 TABLET(10 MG) BY MOUTH DAILY 30 tablet 3   No facility-administered medications prior to visit.    Allergies  Allergen Reactions  . Flagyl [Metronidazole] Hives    Itchy all over    ROS Review of Systems See hpi   Objective:    Physical Exam  BP 115/70   Pulse 80   Temp 98.1 F (36.7 C) (Temporal)   Ht '5\' 2"'  (1.575 m)   Wt 185 lb (83.9 kg)   SpO2 97%   BMI 33.84 kg/m  Wt Readings from Last 3 Encounters:  02/19/20 185 lb (83.9 kg)  12/18/19 175 lb (79.4 kg)  06/01/19 166 lb (75.3 kg)   Physical Exam  Constitutional: Oriented to person, place, and time. Appears well-developed and well-nourished.  HENT:  Head: Normocephalic and atraumatic.  Eyes: Conjunctivae and EOM are normal.  Cardiovascular: Normal rate, regular rhythm, normal heart sounds and intact distal pulses.  No murmur heard. Pulmonary/Chest: Effort normal and breath sounds normal. No stridor. No respiratory distress. Has no wheezes.  Neurological: Is alert and oriented to person, place, and time.  Skin: Skin is warm. Capillary refill takes less than 2 seconds.  Psychiatric: Has a normal mood and affect. Behavior is normal. Judgment and thought content normal.    Health Maintenance Due  Topic Date Due  . COVID-19 Vaccine (1) Never done    There are no preventive care reminders to display for this patient.  Lab Results  Component Value Date   TSH 1.670 02/19/2020   Lab Results  Component Value Date   WBC  7.7 02/19/2020   HGB 12.7 02/19/2020   HCT 39.9 02/19/2020   MCV 89.0 02/19/2020   PLT 415 (H) 11/10/2017   Lab Results  Component Value Date   NA 139 02/19/2020   K 4.1 02/19/2020   CO2 26 02/19/2020   GLUCOSE 87 02/19/2020   BUN 8 02/19/2020   CREATININE 0.52 (L) 02/19/2020   BILITOT 0.6 02/19/2020  ALKPHOS 73 02/19/2020   AST 19 02/19/2020   ALT 11 02/19/2020   PROT 6.5 02/19/2020   ALBUMIN 4.0 02/19/2020   CALCIUM 8.9 02/19/2020   ANIONGAP 13 11/10/2017   Lab Results  Component Value Date   CHOL 183 02/28/2019   Lab Results  Component Value Date   HDL 73 02/28/2019   Lab Results  Component Value Date   LDLCALC 99 02/28/2019   Lab Results  Component Value Date   TRIG 55 02/28/2019   Lab Results  Component Value Date   CHOLHDL 2.5 02/28/2019   Lab Results  Component Value Date   HGBA1C 5.3 02/19/2020      Assessment & Plan:   Problem List Items Addressed This Visit      Cardiovascular and Mediastinum   Essential hypertension - Primary Patient's blood pressure is at goal of 139/89 or less. Condition is stable. Continue current medications and treatment plan. I recommend that you exercise for 30-45 minutes 5 days a week. I also recommend a balanced diet with fruits and vegetables every day, lean meats, and little fried foods. The DASH diet (you can find this online) is a good example of this.    Relevant Medications   amLODipine (NORVASC) 5 MG tablet   Other Relevant Orders   CMP14+EGFR (Completed)    Other Visit Diagnoses    Adjustment disorder with mixed anxiety and depressed mood     Discussed CELEXA Discussed counseling   Relevant Medications   citalopram (CELEXA) 10 MG tablet   Fatigue associated with anemia    -  Continue iron supplementation through iron rich foods   Relevant Orders   POCT CBC (Completed)   POCT glycosylated hemoglobin (Hb A1C) (Completed)   TSH (Completed)   VITAMIN D 25 Hydroxy (Vit-D Deficiency, Fractures)  (Completed)   Weight gain    - advised to decrease alcohol intake    Relevant Orders   POCT glycosylated hemoglobin (Hb A1C) (Completed)   Impaired fasting glucose       Relevant Orders   POCT glycosylated hemoglobin (Hb A1C) (Completed)   Snoring    - concerning for sleep apnea   Relevant Orders   Ambulatory referral to Sleep Studies   Chronic fatigue    -  Referral placed for sleep studies   Relevant Orders   Ambulatory referral to Sleep Studies   Alcohol use    - decrease alcohol use contributing carbs and empty calories        Meds ordered this encounter  Medications  . amLODipine (NORVASC) 5 MG tablet    Sig: Take 1 tablet (5 mg total) by mouth daily.    Dispense:  90 tablet    Refill:  3    Providing enough medication to get patient to her office visit in 5 days.  . citalopram (CELEXA) 10 MG tablet    Sig: TAKE 1 TABLET(10 MG) BY MOUTH DAILY    Dispense:  90 tablet    Refill:  3    Follow-up: No follow-ups on file.    Forrest Moron, MD

## 2020-02-19 NOTE — Progress Notes (Signed)
Med refills on Celexa and amlodipine.  Medications are working well with no known side effects. Pt states no issues with her hypertension at this time. No other concerns at this time.

## 2020-02-20 LAB — CMP14+EGFR
ALT: 11 IU/L (ref 0–32)
AST: 19 IU/L (ref 0–40)
Albumin/Globulin Ratio: 1.6 (ref 1.2–2.2)
Albumin: 4 g/dL (ref 3.8–4.8)
Alkaline Phosphatase: 73 IU/L (ref 39–117)
BUN/Creatinine Ratio: 15 (ref 9–23)
BUN: 8 mg/dL (ref 6–24)
Bilirubin Total: 0.6 mg/dL (ref 0.0–1.2)
CO2: 26 mmol/L (ref 20–29)
Calcium: 8.9 mg/dL (ref 8.7–10.2)
Chloride: 102 mmol/L (ref 96–106)
Creatinine, Ser: 0.52 mg/dL — ABNORMAL LOW (ref 0.57–1.00)
GFR calc Af Amer: 133 mL/min/{1.73_m2} (ref >59)
GFR calc non Af Amer: 115 mL/min/{1.73_m2} (ref >59)
Globulin, Total: 2.5 g/dL (ref 1.5–4.5)
Glucose: 87 mg/dL (ref 65–99)
Potassium: 4.1 mmol/L (ref 3.5–5.2)
Sodium: 139 mmol/L (ref 134–144)
Total Protein: 6.5 g/dL (ref 6.0–8.5)

## 2020-02-20 LAB — TSH: TSH: 1.67 u[IU]/mL (ref 0.450–4.500)

## 2020-02-20 LAB — VITAMIN D 25 HYDROXY (VIT D DEFICIENCY, FRACTURES): Vit D, 25-Hydroxy: 13.8 ng/mL — ABNORMAL LOW (ref 30.0–100.0)

## 2020-02-28 ENCOUNTER — Other Ambulatory Visit: Payer: Self-pay | Admitting: Family Medicine

## 2020-02-28 MED ORDER — VITAMIN D (ERGOCALCIFEROL) 1.25 MG (50000 UNIT) PO CAPS: 50000.0000 [IU] | ORAL_CAPSULE | ORAL | 1 refills | Status: DC

## 2020-03-04 ENCOUNTER — Institutional Professional Consult (permissible substitution): Payer: PRIVATE HEALTH INSURANCE | Admitting: Neurology

## 2020-03-18 ENCOUNTER — Institutional Professional Consult (permissible substitution): Payer: PRIVATE HEALTH INSURANCE | Admitting: Neurology

## 2020-05-03 ENCOUNTER — Other Ambulatory Visit: Payer: Self-pay

## 2020-05-03 ENCOUNTER — Telehealth: Payer: Self-pay

## 2020-05-03 ENCOUNTER — Encounter: Payer: Self-pay | Admitting: Obstetrics & Gynecology

## 2020-05-03 ENCOUNTER — Ambulatory Visit: Payer: PRIVATE HEALTH INSURANCE | Admitting: Obstetrics & Gynecology

## 2020-05-03 VITALS — BP 120/80 | HR 70 | Temp 97.2°F | Resp 16 | Wt 170.0 lb

## 2020-05-03 DIAGNOSIS — N390 Urinary tract infection, site not specified: Secondary | ICD-10-CM | POA: Diagnosis not present

## 2020-05-03 DIAGNOSIS — N898 Other specified noninflammatory disorders of vagina: Secondary | ICD-10-CM

## 2020-05-03 LAB — POCT URINALYSIS DIPSTICK
Bilirubin, UA: NEGATIVE
Blood, UA: NEGATIVE
Glucose, UA: NEGATIVE
Ketones, UA: NEGATIVE
Leukocytes, UA: NEGATIVE
Nitrite, UA: NEGATIVE
Protein, UA: NEGATIVE
Urobilinogen, UA: NEGATIVE E.U./dL — AB
pH, UA: 5 (ref 5.0–8.0)

## 2020-05-03 MED ORDER — CLINDAMYCIN PHOSPHATE 100 MG VA SUPP: 100.0000 mg | Freq: Every day | VAGINAL | 0 refills | Status: DC

## 2020-05-03 NOTE — Progress Notes (Signed)
GYNECOLOGY  VISIT  CC:   Vaginal odor  HPI: 48 y.o. [id number] Single Black or African American female here for vaginitis.  She noted vaginal odor and vaginal discomfort that started about a week ago.  She noted a fishy smell after intercourse.  Denies irregular bleeding, dysuria, or pelvic pain.  She is not having any itching.  She has not tried any OTC products.    GYNECOLOGIC HISTORY: Patient's last menstrual period was 04/19/2020 (exact date). Contraception: micronor  Patient Active Problem List   Diagnosis Date Noted  . Fever, unspecified 06/01/2019  . Malaise 06/01/2019  . Asthma 05/13/2019  . Hypertensive disorder 05/13/2019  . Acute appendicitis 11/04/2017  . Essential hypertension 11/26/2016    Class: Family History of  . Herpes genitalis in women 07/18/2014    Class: History of    Past Medical History:  Diagnosis Date  . Abnormal Pap smear    many yrs ago, 2020 LGSIL  . Asthma   . Depression   . Fibroids   . Hypertension   . STD (sexually transmitted disease)    HSV2  . Vaginal wall cyst     Past Surgical History:  Procedure Laterality Date  . CHOLECYSTECTOMY N/A 05/11/2014   Procedure: LAPAROSCOPIC CHOLECYSTECTOMY WITH INTRAOPERATIVE CHOLANGIOGRAM;  Surgeon: Zenovia Jarred, MD;  Location: [facility];  Service: General;  Laterality: N/A;  . CHOLECYSTECTOMY  04/2014  . COLPOSCOPY    . CRYOTHERAPY     for abnormal pap  . DILATION AND CURETTAGE OF UTERUS    . LAPAROSCOPIC APPENDECTOMY N/A 11/05/2017   Procedure: APPENDECTOMY LAPAROSCOPIC;  Surgeon: Coralie Keens, MD;  Location: [facility];  Service: General;  Laterality: N/A;    MEDS:   Current Outpatient Medications on File Prior to Visit  Medication Sig Dispense Refill  . albuterol (PROVENTIL HFA;VENTOLIN HFA) 108 (90 Base) MCG/ACT inhaler Inhale 2 puffs into the lungs every 6 (six) hours as needed for wheezing or shortness of breath. 1 Inhaler 0  . amLODipine (NORVASC) 5 MG tablet Take 1 tablet (5 mg total) by  mouth daily. 90 tablet 3  . citalopram (CELEXA) 10 MG tablet TAKE 1 TABLET(10 MG) BY MOUTH DAILY 90 tablet 3  . ibuprofen (ADVIL,MOTRIN) 200 MG tablet Take 200 mg by mouth as needed.    . Multiple Vitamins-Minerals (MULTIVITAMIN PO) Take 1 tablet by mouth daily as needed (supplemental).     . norethindrone (MICRONOR) 0.35 MG tablet Take 1 tablet (0.35 mg total) by mouth daily. 3 Package 4  . valACYclovir (VALTREX) 1000 MG tablet Take one daily at onset of outbreak 45 tablet 12  . Vitamin D, Ergocalciferol, (DRISDOL) 1.25 MG (50000 UNIT) CAPS capsule Take 1 capsule (50,000 Units total) by mouth every 7 (seven) days. 12 capsule 1   No current facility-administered medications on file prior to visit.    ALLERGIES: Flagyl [metronidazole]  Family History  Problem Relation Age of Onset  . Diabetes Mother   . Cancer Mother        leukemia  . Lupus Mother   . Hypertension Mother   . Hypertension Father   . Breast cancer Maternal Aunt     SH:  Single, non smoker  Review of Systems  Constitutional: Negative.   HENT: Negative.   Eyes: Negative.   Respiratory: Negative.   Cardiovascular: Negative.   Gastrointestinal: Negative.   Endocrine: Negative.   Genitourinary:       Vaginal odor, occ pain with urination, slight irritation  Musculoskeletal: Negative.  Skin: Negative.   Allergic/Immunologic: Negative.   Neurological: Negative.   Psychiatric/Behavioral: Negative.     PHYSICAL EXAMINATION:    Temp (!) 97.2 F (36.2 C) (Skin)   Wt 170 lb (77.1 kg)   LMP 04/19/2020 (Exact Date)   BMI 31.09 kg/m     General appearance: alert, cooperative and appears stated age Lymph:  no inguinal LAD noted  Pelvic: External genitalia:  no lesions              Urethra:  normal appearing urethra with no masses, tenderness or lesions              Bartholins and Skenes: normal                 Vagina: normal appearing vagina with yellowish/watery discharge, no lesions              Cervix: no  lesions              Bimanual Exam:  Uterus:  normal size, contour, position, consistency, mobility, non-tender              Adnexa: no mass, fullness, tenderness  Chaperone, Terence Lux, CMA, was present for exam.  Assessment: Vaginal odor and vaginal discharge, possible BV Metronidazole allergy  Plan: Clindamycin suppositories, vaginally, nightly x 3 nights.  #3/0RF Vaginitis testing obtained today

## 2020-05-03 NOTE — Telephone Encounter (Signed)
Patient is calling in regards to appointment for infection. No available slots need triage to assist.

## 2020-05-03 NOTE — Telephone Encounter (Signed)
AEX 12/18/19 with DL +BV 11/2019, 04/2019  Spoke with pt. Pt states having vaginal odor after intercourse x 1 week ago. Pt denies vaginal discharge, itching or bleeding, any UTI sx, fever, or chills.  Pt states changed soaps recently x 2 weeks ago and thinks that was triggered this. Pt states has h/o BV.   * pt allergic to Flagyl= hives   Pt advised to have OV for further evaluation. Pt agreeable. Pt aware Dr Quincy Simmonds not in office today. [place] with seeing another provider.  Pt scheduled as work-in today with Dr Sabra Heck on 6/11 at 345 pm. Pt verbalized understanding. CPS neg.   Routing to Dr Sabra Heck for review.  Encounter closed.

## 2020-05-06 ENCOUNTER — Other Ambulatory Visit: Payer: Self-pay | Admitting: Obstetrics & Gynecology

## 2020-05-06 ENCOUNTER — Telehealth: Payer: Self-pay | Admitting: Obstetrics & Gynecology

## 2020-05-06 LAB — NUSWAB VAGINITIS PLUS (VG+)
Atopobium vaginae: HIGH Score — AB
Candida albicans, NAA: NEGATIVE
Candida glabrata, NAA: NEGATIVE
Chlamydia trachomatis, NAA: NEGATIVE
Megasphaera 1: HIGH Score — AB
Neisseria gonorrhoeae, NAA: NEGATIVE
Trich vag by NAA: NEGATIVE

## 2020-05-06 MED ORDER — CLINDAMYCIN PHOSPHATE 2 % VA CREA: 1.0000 | TOPICAL_CREAM | Freq: Every day | VAGINAL | 0 refills | Status: DC

## 2020-05-06 NOTE — Telephone Encounter (Signed)
Spoke with pt. Pt was seen for OV on 05/03/20 and was dx for possible BV  Labs 05/03/20- Nuswab results High-2 BV marker. Pt not seen results.   Pt states cleocin vaginal suppositories cost prohibitive. Pt requesting to use clindamycin 2% vaginal cream instead. Pt has used this vaginal cream in Jan 1/21 with +BV.  Advised will review with Dr Sabra Heck and return call to pt. Pt agreeable.   Routing to Dr Sabra Heck for new medication request. Pharmacy verified.

## 2020-05-06 NOTE — Telephone Encounter (Signed)
Patient would like to speak with nurse about prescription she would like change because of cost.

## 2020-05-06 NOTE — Telephone Encounter (Signed)
Rx completed for clindamycin 2% cream nightly x 7 nights.

## 2020-05-06 NOTE — Telephone Encounter (Signed)
Spoke with pt. Pt given update on Rx per Dr Sabra Heck. Pt agreeable and verbalized understanding.   Encounter closed.

## 2020-07-03 ENCOUNTER — Other Ambulatory Visit: Payer: Self-pay | Admitting: Family Medicine

## 2020-07-03 ENCOUNTER — Telehealth: Payer: Self-pay | Admitting: Registered Nurse

## 2020-07-03 DIAGNOSIS — F4323 Adjustment disorder with mixed anxiety and depressed mood: Secondary | ICD-10-CM

## 2020-07-03 NOTE — Telephone Encounter (Signed)
Medication Refill - Medication: citalopram   Has the patient contacted their pharmacy? Yes.  Pt is requesting a short supply until next appt. Please advise.  (Agent: If no, request that the patient contact the pharmacy for the refill.) (Agent: If yes, when and what did the pharmacy advise?)  Preferred Pharmacy (with phone number or street name):  [facility] [place], [address]  [place] [address]  Phone: [phone] Fax: [phone]  Hours: Not open 24 hours     Agent: Please be advised that RX refills may take up to 3 business days. We ask that you follow-up with your pharmacy.

## 2020-07-03 NOTE — Telephone Encounter (Signed)
Pt has a  TOC with Beth Frost 09/11/2020 pt asking for refill on  What is the name of the medication? citalopram (CELEXA) 10 MG tablet [ amLODipine (NORVASC) 5 MG tablet     Have you contacted your pharmacy to request a refill? y  Which pharmacy would you like this sent to? [facility] [place], [address]  [place], [place] [address]  Phone:  [phone] Fax:  [phone]    Patient notified that their request is being sent to the clinical staff for review and that they should receive a call once it is complete. If they do not receive a call within 72 hours they can check with their pharmacy or our office.

## 2020-07-04 ENCOUNTER — Other Ambulatory Visit: Payer: Self-pay

## 2020-07-04 DIAGNOSIS — F4323 Adjustment disorder with mixed anxiety and depressed mood: Secondary | ICD-10-CM

## 2020-07-04 DIAGNOSIS — I10 Essential (primary) hypertension: Secondary | ICD-10-CM

## 2020-07-04 MED ORDER — AMLODIPINE BESYLATE 5 MG PO TABS: 5.0000 mg | ORAL_TABLET | Freq: Every day | ORAL | 0 refills | Status: DC

## 2020-07-04 MED ORDER — CITALOPRAM HYDROBROMIDE 10 MG PO TABS: ORAL_TABLET | ORAL | 0 refills | Status: DC

## 2020-07-04 NOTE — Telephone Encounter (Signed)
Sent 60 day supply no refills

## 2020-09-07 ENCOUNTER — Ambulatory Visit: Payer: Self-pay

## 2020-09-11 ENCOUNTER — Other Ambulatory Visit: Payer: Self-pay

## 2020-09-11 ENCOUNTER — Encounter: Payer: Self-pay | Admitting: Registered Nurse

## 2020-09-11 ENCOUNTER — Telehealth (INDEPENDENT_AMBULATORY_CARE_PROVIDER_SITE_OTHER): Payer: PRIVATE HEALTH INSURANCE | Admitting: Registered Nurse

## 2020-09-11 VITALS — BP 117/72 | Temp 97.5°F

## 2020-09-11 DIAGNOSIS — J4520 Mild intermittent asthma, uncomplicated: Secondary | ICD-10-CM

## 2020-09-11 DIAGNOSIS — Z8639 Personal history of other endocrine, nutritional and metabolic disease: Secondary | ICD-10-CM

## 2020-09-11 DIAGNOSIS — I10 Essential (primary) hypertension: Secondary | ICD-10-CM | POA: Diagnosis not present

## 2020-09-11 DIAGNOSIS — F4323 Adjustment disorder with mixed anxiety and depressed mood: Secondary | ICD-10-CM | POA: Diagnosis not present

## 2020-09-11 DIAGNOSIS — A6009 Herpesviral infection of other urogenital tract: Secondary | ICD-10-CM | POA: Diagnosis not present

## 2020-09-11 DIAGNOSIS — J329 Chronic sinusitis, unspecified: Secondary | ICD-10-CM

## 2020-09-11 DIAGNOSIS — J3489 Other specified disorders of nose and nasal sinuses: Secondary | ICD-10-CM

## 2020-09-11 DIAGNOSIS — B9689 Other specified bacterial agents as the cause of diseases classified elsewhere: Secondary | ICD-10-CM

## 2020-09-11 MED ORDER — AZELASTINE HCL 0.1 % NA SOLN: 1.0000 | Freq: Two times a day (BID) | NASAL | 12 refills | Status: AC

## 2020-09-11 MED ORDER — ALBUTEROL SULFATE HFA 108 (90 BASE) MCG/ACT IN AERS: 2.0000 | INHALATION_SPRAY | Freq: Four times a day (QID) | RESPIRATORY_TRACT | 5 refills | Status: DC | PRN

## 2020-09-11 MED ORDER — AMLODIPINE BESYLATE 5 MG PO TABS: 5.0000 mg | ORAL_TABLET | Freq: Every day | ORAL | 3 refills | Status: DC

## 2020-09-11 MED ORDER — VALACYCLOVIR HCL 1 G PO TABS: ORAL_TABLET | ORAL | 12 refills | Status: DC

## 2020-09-11 MED ORDER — AMOXICILLIN-POT CLAVULANATE 875-125 MG PO TABS: 1.0000 | ORAL_TABLET | Freq: Two times a day (BID) | ORAL | 0 refills | Status: DC

## 2020-09-11 MED ORDER — VITAMIN D (ERGOCALCIFEROL) 1.25 MG (50000 UNIT) PO CAPS: 50000.0000 [IU] | ORAL_CAPSULE | ORAL | 1 refills | Status: DC

## 2020-09-11 MED ORDER — CETIRIZINE HCL 10 MG PO TABS: 10.0000 mg | ORAL_TABLET | Freq: Every day | ORAL | 11 refills | Status: DC

## 2020-09-11 MED ORDER — CITALOPRAM HYDROBROMIDE 10 MG PO TABS: ORAL_TABLET | ORAL | 3 refills | Status: DC

## 2020-09-11 NOTE — Patient Instructions (Signed)
° ° ° °  If you have lab work done today you will be contacted with your lab results within the next 2 weeks.  If you have not heard from us then please contact us. The fastest way to get your results is to register for My Chart. ° ° °IF you received an x-ray today, you will receive an invoice from [facility]. Please contact [facility] at [phone] with questions or concerns regarding your invoice.  ° °IF you received labwork today, you will receive an invoice from [facility]. Please contact [facility] at [phone] with questions or concerns regarding your invoice.  ° °Our billing staff will not be able to assist you with questions regarding bills from these companies. ° °You will be contacted with the lab results as soon as they are available. The fastest way to get your results is to activate your My Chart account. Instructions are located on the last page of this paperwork. If you have not heard from us regarding the results in 2 weeks, please contact this office. °  ° ° ° °

## 2020-11-11 NOTE — Progress Notes (Signed)
GYNECOLOGY  VISIT  CC:  Having vaginal irritation  HPI: 49 y.o. G4P2022 Married Black or [place] American female here for vaginal odor, urine odor, back pain. poct urine-neg  Feels like having a vaginal irritation with odor, more odor noted after sex. Does not think discharge has changed, discharge seems normal to her. Does not think husband is cheating but would like testing for std     GYNECOLOGIC HISTORY: Patient's last menstrual period was 10/22/2020 (exact date). Contraception: micronor Menopausal hormone therapy: none  Patient Active Problem List   Diagnosis Date Noted  . Fever, unspecified 06/01/2019  . Malaise 06/01/2019  . Asthma 05/13/2019  . Hypertensive disorder 05/13/2019  . Acute appendicitis 11/04/2017  . Essential hypertension 11/26/2016    Class: Family History of  . Herpes genitalis in women 07/18/2014    Class: History of    Past Medical History:  Diagnosis Date  . Abnormal Pap smear    many yrs ago, 2020 LGSIL  . Asthma   . Depression   . Fibroids   . Hypertension   . STD (sexually transmitted disease)    HSV2  . Vaginal wall cyst     Past Surgical History:  Procedure Laterality Date  . CHOLECYSTECTOMY N/A 05/11/2014   Procedure: LAPAROSCOPIC CHOLECYSTECTOMY WITH INTRAOPERATIVE CHOLANGIOGRAM;  Surgeon: Zenovia Jarred, MD;  Location: [facility];  Service: General;  Laterality: N/A;  . CHOLECYSTECTOMY  04/2014  . COLPOSCOPY    . CRYOTHERAPY     for abnormal pap  . DILATION AND CURETTAGE OF UTERUS    . LAPAROSCOPIC APPENDECTOMY N/A 11/05/2017   Procedure: APPENDECTOMY LAPAROSCOPIC;  Surgeon: Coralie Keens, MD;  Location: [facility];  Service: General;  Laterality: N/A;    MEDS:   Current Outpatient Medications on File Prior to Visit  Medication Sig Dispense Refill  . albuterol (VENTOLIN HFA) 108 (90 Base) MCG/ACT inhaler Inhale 2 puffs into the lungs every 6 (six) hours as needed for wheezing or shortness of breath. 18 g 5  . amLODipine  (NORVASC) 5 MG tablet Take 1 tablet (5 mg total) by mouth daily. 90 tablet 3  . azelastine (ASTELIN) 0.1 % nasal spray Place 1 spray into both nostrils 2 (two) times daily. Use in each nostril as directed 30 mL 12  . cetirizine (ZYRTEC) 10 MG tablet Take 1 tablet (10 mg total) by mouth daily. 30 tablet 11  . citalopram (CELEXA) 10 MG tablet TAKE 1 TABLET(10 MG) BY MOUTH DAILY 90 tablet 3  . ibuprofen (ADVIL,MOTRIN) 200 MG tablet Take 200 mg by mouth as needed.    . Multiple Vitamins-Minerals (MULTIVITAMIN PO) Take 1 tablet by mouth daily as needed (supplemental).     . norethindrone (MICRONOR) 0.35 MG tablet Take 1 tablet (0.35 mg total) by mouth daily. 3 Package 4  . valACYclovir (VALTREX) 1000 MG tablet Take one daily at onset of outbreak 45 tablet 12  . Vitamin D, Ergocalciferol, (DRISDOL) 1.25 MG (50000 UNIT) CAPS capsule Take 1 capsule (50,000 Units total) by mouth every 7 (seven) days. 12 capsule 1  . amoxicillin-clavulanate (AUGMENTIN) 875-125 MG tablet Take 1 tablet by mouth 2 (two) times daily. (Patient not taking: Reported on 11/12/2020) 20 tablet 0   No current facility-administered medications on file prior to visit.    ALLERGIES: Flagyl [metronidazole]  Family History  Problem Relation Age of Onset  . Diabetes Mother   . Cancer Mother        leukemia  . Lupus Mother   . Hypertension Mother   .  Hypertension Father   . Breast cancer Maternal Aunt      Review of Systems  Constitutional: Negative.   HENT: Negative.   Eyes: Negative.   Respiratory: Negative.   Cardiovascular: Negative.   Gastrointestinal: Negative.   Endocrine: Negative.   Genitourinary: Negative.        Vaginal odor, urine odor, back pain  Musculoskeletal: Negative.   Skin: Negative.   Allergic/Immunologic: Negative.   Neurological: Negative.   Hematological: Negative.   Psychiatric/Behavioral: Negative.     PHYSICAL EXAMINATION:    BP 110/72   Pulse 68   Resp 16   Wt 188 lb (85.3 kg)    LMP 10/22/2020 (Exact Date)   BMI 34.39 kg/m     General appearance: alert, cooperative, no acute distress  Abdomen: soft, non-tender; bowel sounds normal; no masses,  no organomegaly Lymph:  no inguinal LAD noted  Pelvic: External genitalia:  no lesions              Urethra:  normal appearing urethra with no masses, tenderness or lesions              Bartholins and Skenes: normal                 Vagina: normal appearing vagina but moderate white/yellow discharge              Cervix: no cervical motion tenderness and no lesions              Bimanual Exam:  Uterus:  normal size, contour, position, consistency, mobility, non-tender              Adnexa: no mass, fullness, tenderness  Wet mount: ph 5.0, + clue, neg trich, neg yeast, 1-3 WBC  Chaperone, Taylor, CMA, was present for exam.  Assessment: BV   Plan: Cleocin 2%, per vagina x 7 days (pt does not like metronidazole) GC/CT NAAT test sent to lab F/U 1-2 months for annual exam

## 2020-11-12 ENCOUNTER — Ambulatory Visit: Payer: PRIVATE HEALTH INSURANCE | Admitting: Nurse Practitioner

## 2020-11-12 ENCOUNTER — Encounter: Payer: Self-pay | Admitting: Nurse Practitioner

## 2020-11-12 ENCOUNTER — Other Ambulatory Visit: Payer: Self-pay

## 2020-11-12 VITALS — BP 110/72 | HR 68 | Resp 16 | Wt 188.0 lb

## 2020-11-12 DIAGNOSIS — Z113 Encounter for screening for infections with a predominantly sexual mode of transmission: Secondary | ICD-10-CM | POA: Diagnosis not present

## 2020-11-12 DIAGNOSIS — R8290 Unspecified abnormal findings in urine: Secondary | ICD-10-CM

## 2020-11-12 DIAGNOSIS — N760 Acute vaginitis: Secondary | ICD-10-CM | POA: Diagnosis not present

## 2020-11-12 DIAGNOSIS — B9689 Other specified bacterial agents as the cause of diseases classified elsewhere: Secondary | ICD-10-CM

## 2020-11-12 LAB — POCT URINALYSIS DIPSTICK
Bilirubin, UA: NEGATIVE
Blood, UA: NEGATIVE
Glucose, UA: NEGATIVE
Ketones, UA: NEGATIVE
Leukocytes, UA: NEGATIVE
Nitrite, UA: NEGATIVE
Protein, UA: NEGATIVE
Urobilinogen, UA: NEGATIVE E.U./dL — AB
pH, UA: 5 (ref 5.0–8.0)

## 2020-11-12 MED ORDER — CLINDAMYCIN PHOSPHATE 2 % VA CREA: 1.0000 | TOPICAL_CREAM | Freq: Every day | VAGINAL | 0 refills | Status: DC

## 2020-11-12 NOTE — Patient Instructions (Signed)
Bacterial Vaginosis  Bacterial vaginosis is a vaginal infection that occurs when the normal balance of bacteria in the vagina is disrupted. It results from an overgrowth of certain bacteria. This is the most common vaginal infection among women ages 15-44. Because bacterial vaginosis increases your risk for STIs (sexually transmitted infections), getting treated can help reduce your risk for chlamydia, gonorrhea, herpes, and HIV (human immunodeficiency virus). Treatment is also important for preventing complications in pregnant women, because this condition can cause an early (premature) delivery. What are the causes? This condition is caused by an increase in harmful bacteria that are normally present in small amounts in the vagina. However, the reason that the condition develops is not fully understood. What increases the risk? The following factors may make you more likely to develop this condition:  Having a new sexual partner or multiple sexual partners.  Having unprotected sex.  Douching.  Having an intrauterine device (IUD).  Smoking.  Drug and alcohol abuse.  Taking certain antibiotic medicines.  Being pregnant. You cannot get bacterial vaginosis from toilet seats, bedding, swimming pools, or contact with objects around you. What are the signs or symptoms? Symptoms of this condition include:  Grey or white vaginal discharge. The discharge can also be watery or foamy.  A fish-like odor with discharge, especially after sexual intercourse or during menstruation.  Itching in and around the vagina.  Burning or pain with urination. Some women with bacterial vaginosis have no signs or symptoms. How is this diagnosed? This condition is diagnosed based on:  Your medical history.  A physical exam of the vagina.  Testing a sample of vaginal fluid under a microscope to look for a large amount of bad bacteria or abnormal cells. Your health care provider may use a cotton swab or  a small wooden spatula to collect the sample. How is this treated? This condition is treated with antibiotics. These may be given as a pill, a vaginal cream, or a medicine that is put into the vagina (suppository). If the condition comes back after treatment, a second round of antibiotics may be needed. Follow these instructions at home: Medicines  Take over-the-counter and prescription medicines only as told by your health care provider.  Take or use your antibiotic as told by your health care provider. Do not stop taking or using the antibiotic even if you start to feel better. General instructions  If you have a female sexual partner, tell her that you have a vaginal infection. She should see her health care provider and be treated if she has symptoms. If you have a female sexual partner, he does not need treatment.  During treatment: ? Avoid sexual activity until you finish treatment. ? Do not douche. ? Avoid alcohol as directed by your health care provider. ? Avoid breastfeeding as directed by your health care provider.  Drink enough water and fluids to keep your urine clear or pale yellow.  Keep the area around your vagina and rectum clean. ? Wash the area daily with warm water. ? Wipe yourself from front to back after using the toilet.  Keep all follow-up visits as told by your health care provider. This is important. How is this prevented?  Do not douche.  Wash the outside of your vagina with warm water only.  Use protection when having sex. This includes latex condoms and dental dams.  Limit how many sexual partners you have. To help prevent bacterial vaginosis, it is best to have sex with just one partner (  monogamous).  Make sure you and your sexual partner are tested for STIs.  Wear cotton or cotton-lined underwear.  Avoid wearing tight pants and pantyhose, especially during summer.  Limit the amount of alcohol that you drink.  Do not use any products that contain  nicotine or tobacco, such as cigarettes and e-cigarettes. If you need help quitting, ask your health care provider.  Do not use illegal drugs. Where to find more information  Centers for Disease Control and Prevention: www.cdc.gov/std  American Sexual Health Association (ASHA): www.ashastd.org  U.S. Department of Health and Human Services, Office on Women's Health: www.womenshealth.gov/ or https://www.womenshealth.gov/a-z-topics/bacterial-vaginosis Contact a health care provider if:  Your symptoms do not improve, even after treatment.  You have more discharge or pain when urinating.  You have a fever.  You have pain in your abdomen.  You have pain during sex.  You have vaginal bleeding between periods. Summary  Bacterial vaginosis is a vaginal infection that occurs when the normal balance of bacteria in the vagina is disrupted.  Because bacterial vaginosis increases your risk for STIs (sexually transmitted infections), getting treated can help reduce your risk for chlamydia, gonorrhea, herpes, and HIV (human immunodeficiency virus). Treatment is also important for preventing complications in pregnant women, because the condition can cause an early (premature) delivery.  This condition is treated with antibiotic medicines. These may be given as a pill, a vaginal cream, or a medicine that is put into the vagina (suppository). This information is not intended to replace advice given to you by your health care provider. Make sure you discuss any questions you have with your health care provider. Document Revised: 10/22/2017 Document Reviewed: 07/25/2016 Elsevier Patient Education  2020 Elsevier Inc.  

## 2020-11-14 LAB — GC/CHLAMYDIA PROBE AMP
Chlamydia trachomatis, NAA: NEGATIVE
Neisseria Gonorrhoeae by PCR: NEGATIVE

## 2020-11-26 ENCOUNTER — Encounter: Payer: Self-pay | Admitting: Registered Nurse

## 2020-11-26 NOTE — Progress Notes (Signed)
Established Patient Office Visit  Subjective:  Patient ID: Beth Frost, female    DOB: Apr 27, 1971  Age: 50 y.o. MRN: [id number]  CC:  Chief Complaint  Patient presents with  . Transitions Of Care    PAtient states she is here for an TOC also experiencing a cough, right ear pain, sore throat since saturday, Per patient she has taken THeraflu and and Robotussin with litle to no relief.    HPI Beth Frost presents for toc from [facility].  Histories reviewed with patient. Updated as warranted  Respiratory symptoms: Ongoing for around 1 week. Ear pressure and pain. Sinus pressure. PND leading to mild cough, sore throat. No sick contacts. Has taken OTCs with limited relief.   Otherwise feeling well. Needs medications refilled.  Past Medical History:  Diagnosis Date  . Abnormal Pap smear    many yrs ago, 2020 LGSIL  . Asthma   . Depression   . Fibroids   . Hypertension   . STD (sexually transmitted disease)    HSV2  . Vaginal wall cyst     Past Surgical History:  Procedure Laterality Date  . CHOLECYSTECTOMY N/A 05/11/2014   Procedure: LAPAROSCOPIC CHOLECYSTECTOMY WITH INTRAOPERATIVE CHOLANGIOGRAM;  Surgeon: Liz Malady, MD;  Location: [facility];  Service: General;  Laterality: N/A;  . CHOLECYSTECTOMY  04/2014  . COLPOSCOPY    . CRYOTHERAPY     for abnormal pap  . DILATION AND CURETTAGE OF UTERUS    . LAPAROSCOPIC APPENDECTOMY N/A 11/05/2017   Procedure: APPENDECTOMY LAPAROSCOPIC;  Surgeon: Abigail Miyamoto, MD;  Location: [facility];  Service: General;  Laterality: N/A;    Family History  Problem Relation Age of Onset  . Diabetes Mother   . Cancer Mother        leukemia  . Lupus Mother   . Hypertension Mother   . Hypertension Father   . Breast cancer Maternal Aunt     Social History   Socioeconomic History  . Marital status: Married    Spouse name: Not on file  . Number of children: Not on file  . Years of education: Not on file  . Highest education  level: Not on file  Occupational History  . Not on file  Tobacco Use  . Smoking status: Never Smoker  . Smokeless tobacco: Never Used  Vaping Use  . Vaping Use: Never used  Substance and Sexual Activity  . Alcohol use: Yes    Alcohol/week: 3.0 standard drinks    Types: 3 Standard drinks or equivalent per week  . Drug use: No  . Sexual activity: Yes    Partners: Male    Birth control/protection: Pill  Other Topics Concern  . Not on file  Social History Narrative  . Not on file   Social Determinants of Health   Financial Resource Strain: Not on file  Food Insecurity: Not on file  Transportation Needs: Not on file  Physical Activity: Not on file  Stress: Not on file  Social Connections: Not on file  Intimate Partner Violence: Not on file    Outpatient Medications Prior to Visit  Medication Sig Dispense Refill  . ibuprofen (ADVIL,MOTRIN) 200 MG tablet Take 200 mg by mouth as needed.    . Multiple Vitamins-Minerals (MULTIVITAMIN PO) Take 1 tablet by mouth daily as needed (supplemental).     . norethindrone (MICRONOR) 0.35 MG tablet Take 1 tablet (0.35 mg total) by mouth daily. 3 Package 4  . albuterol (PROVENTIL HFA;VENTOLIN HFA) 108 (90 Base) MCG/ACT inhaler  Inhale 2 puffs into the lungs every 6 (six) hours as needed for wheezing or shortness of breath. 1 Inhaler 0  . amLODipine (NORVASC) 5 MG tablet Take 1 tablet (5 mg total) by mouth daily. 60 tablet 0  . citalopram (CELEXA) 10 MG tablet TAKE 1 TABLET(10 MG) BY MOUTH DAILY 60 tablet 0  . clindamycin (CLEOCIN) 2 % vaginal cream Place 1 Applicatorful vaginally at bedtime. Use for 7 nights. (Patient not taking: Reported on 11/12/2020) 40 g 0  . valACYclovir (VALTREX) 1000 MG tablet Take one daily at onset of outbreak 45 tablet 12  . Vitamin D, Ergocalciferol, (DRISDOL) 1.25 MG (50000 UNIT) CAPS capsule Take 1 capsule (50,000 Units total) by mouth every 7 (seven) days. 12 capsule 1   No facility-administered medications prior to  visit.    Allergies  Allergen Reactions  . Flagyl [Metronidazole] Hives    Itchy all over    ROS Review of Systems  Constitutional: Negative.   HENT: Negative.   Eyes: Negative.   Respiratory: Negative.   Cardiovascular: Negative.   Gastrointestinal: Negative.   Genitourinary: Negative.   Musculoskeletal: Negative.   Skin: Negative.   Neurological: Negative.   Psychiatric/Behavioral: Negative.   All other systems reviewed and are negative.     Objective:    Physical Exam Vitals and nursing note reviewed.  Constitutional:      General: She is not in acute distress.    Appearance: Normal appearance. She is normal weight. She is not ill-appearing, toxic-appearing or diaphoretic.  HENT:     Head: Normocephalic.     Right Ear: Tympanic membrane, ear canal and external ear normal.     Left Ear: Tympanic membrane, ear canal and external ear normal.     Nose: Congestion present. No rhinorrhea.     Comments: Frontal and maxillary sinus tenderness    Mouth/Throat:     Mouth: Mucous membranes are moist.     Pharynx: Oropharynx is clear. No oropharyngeal exudate or posterior oropharyngeal erythema.  Eyes:     Conjunctiva/sclera: Conjunctivae normal.     Pupils: Pupils are equal, round, and reactive to light.  Neck:     Vascular: No carotid bruit.  Cardiovascular:     Rate and Rhythm: Normal rate and regular rhythm.     Heart sounds: Normal heart sounds. No murmur heard. No friction rub. No gallop.   Pulmonary:     Effort: Pulmonary effort is normal. No respiratory distress.     Breath sounds: Normal breath sounds. No stridor. No wheezing, rhonchi or rales.  Chest:     Chest wall: No tenderness.  Musculoskeletal:     Cervical back: No tenderness.  Skin:    General: Skin is warm and dry.  Neurological:     General: No focal deficit present.     Mental Status: She is alert and oriented to person, place, and time. Mental status is at baseline.  Psychiatric:        Mood  and Affect: Mood normal.        Behavior: Behavior normal.        Thought Content: Thought content normal.        Judgment: Judgment normal.     BP 117/72 Comment: per patient  Temp (!) 97.5 F (36.4 C)  Wt Readings from Last 3 Encounters:  11/12/20 188 lb (85.3 kg)  05/03/20 170 lb (77.1 kg)  02/19/20 185 lb (83.9 kg)     Health Maintenance Due  Topic Date Due  .  COLONOSCOPY (Pts 45-29yrs Insurance coverage will need to be confirmed)  Never done    There are no preventive care reminders to display for this patient.  Lab Results  Component Value Date   TSH 1.670 02/19/2020   Lab Results  Component Value Date   WBC 7.7 02/19/2020   HGB 12.7 02/19/2020   HCT 39.9 02/19/2020   MCV 89.0 02/19/2020   PLT 415 (H) 11/10/2017   Lab Results  Component Value Date   NA 139 02/19/2020   K 4.1 02/19/2020   CO2 26 02/19/2020   GLUCOSE 87 02/19/2020   BUN 8 02/19/2020   CREATININE 0.52 (L) 02/19/2020   BILITOT 0.6 02/19/2020   ALKPHOS 73 02/19/2020   AST 19 02/19/2020   ALT 11 02/19/2020   PROT 6.5 02/19/2020   ALBUMIN 4.0 02/19/2020   CALCIUM 8.9 02/19/2020   ANIONGAP 13 11/10/2017   Lab Results  Component Value Date   CHOL 183 02/28/2019   Lab Results  Component Value Date   HDL 73 02/28/2019   Lab Results  Component Value Date   LDLCALC 99 02/28/2019   Lab Results  Component Value Date   TRIG 55 02/28/2019   Lab Results  Component Value Date   CHOLHDL 2.5 02/28/2019   Lab Results  Component Value Date   HGBA1C 5.3 02/19/2020      Assessment & Plan:   Problem List Items Addressed This Visit      Cardiovascular and Mediastinum   Essential hypertension   Relevant Medications   amLODipine (NORVASC) 5 MG tablet     Respiratory   Asthma   Relevant Medications   albuterol (VENTOLIN HFA) 108 (90 Base) MCG/ACT inhaler     Genitourinary   Herpes genitalis in women   Relevant Medications   valACYclovir (VALTREX) 1000 MG tablet    Other Visit  Diagnoses    History of vitamin D deficiency    -  Primary   Relevant Medications   Vitamin D, Ergocalciferol, (DRISDOL) 1.25 MG (50000 UNIT) CAPS capsule   Adjustment disorder with mixed anxiety and depressed mood       Relevant Medications   citalopram (CELEXA) 10 MG tablet   Sinus pressure       Relevant Medications   amoxicillin-clavulanate (AUGMENTIN) 875-125 MG tablet   azelastine (ASTELIN) 0.1 % nasal spray   cetirizine (ZYRTEC) 10 MG tablet   Bacterial sinusitis       Relevant Medications   valACYclovir (VALTREX) 1000 MG tablet   amoxicillin-clavulanate (AUGMENTIN) 875-125 MG tablet   azelastine (ASTELIN) 0.1 % nasal spray   cetirizine (ZYRTEC) 10 MG tablet      Meds ordered this encounter  Medications  . amLODipine (NORVASC) 5 MG tablet    Sig: Take 1 tablet (5 mg total) by mouth daily.    Dispense:  90 tablet    Refill:  3    Order Specific Question:   Supervising Provider    Answer:   Carlota Raspberry, JEFFREY R [[id number]]  . citalopram (CELEXA) 10 MG tablet    Sig: TAKE 1 TABLET(10 MG) BY MOUTH DAILY    Dispense:  90 tablet    Refill:  3    Order Specific Question:   Supervising Provider    Answer:   Carlota Raspberry, JEFFREY R [id number]  . valACYclovir (VALTREX) 1000 MG tablet    Sig: Take one daily at onset of outbreak    Dispense:  45 tablet    Refill:  12    Patient  will call when refill needed    Order Specific Question:   Supervising Provider    Answer:   Carlota Raspberry, JEFFREY R [[id number]]  . Vitamin D, Ergocalciferol, (DRISDOL) 1.25 MG (50000 UNIT) CAPS capsule    Sig: Take 1 capsule (50,000 Units total) by mouth every 7 (seven) days.    Dispense:  12 capsule    Refill:  1    Order Specific Question:   Supervising Provider    Answer:   Carlota Raspberry, JEFFREY R [[id number]]  . albuterol (VENTOLIN HFA) 108 (90 Base) MCG/ACT inhaler    Sig: Inhale 2 puffs into the lungs every 6 (six) hours as needed for wheezing or shortness of breath.    Dispense:  18 g    Refill:  5    Order Specific Question:    Supervising Provider    Answer:   Carlota Raspberry, JEFFREY R [[id number]]  . amoxicillin-clavulanate (AUGMENTIN) 875-125 MG tablet    Sig: Take 1 tablet by mouth 2 (two) times daily.    Dispense:  20 tablet    Refill:  0    Order Specific Question:   Supervising Provider    Answer:   Carlota Raspberry, JEFFREY R [[id number]]  . azelastine (ASTELIN) 0.1 % nasal spray    Sig: Place 1 spray into both nostrils 2 (two) times daily. Use in each nostril as directed    Dispense:  30 mL    Refill:  12    Order Specific Question:   Supervising Provider    Answer:   Carlota Raspberry, JEFFREY R [[id number]]  . cetirizine (ZYRTEC) 10 MG tablet    Sig: Take 1 tablet (10 mg total) by mouth daily.    Dispense:  30 tablet    Refill:  11    Order Specific Question:   Supervising Provider    Answer:   Carlota Raspberry, JEFFREY R [[id number]]    Follow-up: No follow-ups on file.   PLAN  Suspect allergies vs bacterial infection. Treatment as above.  Refill meds  Labs collected. Will follow up with the patient as warranted.  Patient encouraged to call clinic with any questions, comments, or concerns.  Maximiano Coss, NP

## 2020-11-29 ENCOUNTER — Encounter: Payer: Self-pay | Admitting: Registered Nurse

## 2020-12-04 ENCOUNTER — Other Ambulatory Visit: Payer: Self-pay

## 2020-12-04 ENCOUNTER — Telehealth (INDEPENDENT_AMBULATORY_CARE_PROVIDER_SITE_OTHER): Payer: PRIVATE HEALTH INSURANCE | Admitting: Family Medicine

## 2020-12-04 ENCOUNTER — Encounter: Payer: Self-pay | Admitting: Family Medicine

## 2020-12-04 DIAGNOSIS — R519 Headache, unspecified: Secondary | ICD-10-CM | POA: Diagnosis not present

## 2020-12-04 DIAGNOSIS — U071 COVID-19: Secondary | ICD-10-CM | POA: Diagnosis not present

## 2020-12-04 MED ORDER — MUCINEX MAXIMUM STRENGTH 1200 MG PO TB12: 1.0000 | ORAL_TABLET | Freq: Two times a day (BID) | ORAL | 0 refills | Status: AC | PRN

## 2020-12-04 MED ORDER — PROPRANOLOL HCL ER 60 MG PO CP24: 60.0000 mg | ORAL_CAPSULE | Freq: Every day | ORAL | 0 refills | Status: DC

## 2020-12-04 NOTE — Progress Notes (Signed)
Virtual Visit Note  I connected with patient on 12/04/20 at 1341 by telephone due to unable to work Epic video visit and verified that I am speaking with the correct person using two identifiers. Beth Frost is currently located at home and no family members are currently with them during visit. The provider, Laurita Quint Tanaya Dunigan, FNP is located in their office at time of visit.  I discussed the limitations, risks, security and privacy concerns of performing an evaluation and management service by telephone and the availability of in person appointments. I also discussed with the patient that there may be a patient responsible charge related to this service. The patient expressed understanding and agreed to proceed.   I provided 20 minutes of non-face-to-face time during this encounter.  Chief Complaint  Patient presents with  . Headache    Positive covid 1/7 lightheaded when stands up ,stuffy / foggy - headache ongoing - tylenol not helping, question if she can take ibuprofen   . Chills     HPI ? Last Friday tested positive for COVID Having headaches, dizziness when stands up Was previously taking propranolol for headaches due to COVID Taking Tylenol for headaches Sinus pressure Feels like mild case of flu Fully vaccinated with booster   Allergies  Allergen Reactions  . Flagyl [Metronidazole] Hives    Itchy all over    Prior to Admission medications   Medication Sig Start Date End Date Taking? Authorizing Provider  amLODipine (NORVASC) 5 MG tablet Take 1 tablet (5 mg total) by mouth daily. 09/11/20  Yes Maximiano Coss, NP  azelastine (ASTELIN) 0.1 % nasal spray Place 1 spray into both nostrils 2 (two) times daily. Use in each nostril as directed 09/11/20  Yes Maximiano Coss, NP  cetirizine (ZYRTEC) 10 MG tablet Take 1 tablet (10 mg total) by mouth daily. 09/11/20  Yes Maximiano Coss, NP  citalopram (CELEXA) 10 MG tablet TAKE 1 TABLET(10 MG) BY MOUTH DAILY 09/11/20  Yes  Maximiano Coss, NP  Multiple Vitamins-Minerals (MULTIVITAMIN PO) Take 1 tablet by mouth daily as needed (supplemental).    Yes [provider]  norethindrone (MICRONOR) 0.35 MG tablet Take 1 tablet (0.35 mg total) by mouth daily. 12/18/19  Yes Regina Eck, CNM  valACYclovir (VALTREX) 1000 MG tablet Take one daily at onset of outbreak 09/11/20  Yes Maximiano Coss, NP  Vitamin D, Ergocalciferol, (DRISDOL) 1.25 MG (50000 UNIT) CAPS capsule Take 1 capsule (50,000 Units total) by mouth every 7 (seven) days. 09/11/20  Yes Maximiano Coss, NP  albuterol (VENTOLIN HFA) 108 (90 Base) MCG/ACT inhaler Inhale 2 puffs into the lungs every 6 (six) hours as needed for wheezing or shortness of breath. Patient not taking: Reported on 12/04/2020 09/11/20   Maximiano Coss, NP  clindamycin (CLEOCIN) 2 % vaginal cream Place 1 Applicatorful vaginally at bedtime. Use for 7 nights. Patient not taking: Reported on 12/04/2020 11/12/20   Karma Ganja, NP  ibuprofen (ADVIL,MOTRIN) 200 MG tablet Take 200 mg by mouth as needed. Patient not taking: Reported on 12/04/2020    [provider]    Past Medical History:  Diagnosis Date  . Abnormal Pap smear    many yrs ago, 2020 LGSIL  . Asthma   . Depression   . Fibroids   . Hypertension   . STD (sexually transmitted disease)    HSV2  . Vaginal wall cyst     Past Surgical History:  Procedure Laterality Date  . CHOLECYSTECTOMY N/A 05/11/2014   Procedure: LAPAROSCOPIC CHOLECYSTECTOMY WITH  INTRAOPERATIVE CHOLANGIOGRAM;  Surgeon: Zenovia Jarred, MD;  Location: [place];  Service: General;  Laterality: N/A;  . CHOLECYSTECTOMY  04/2014  . COLPOSCOPY    . CRYOTHERAPY     for abnormal pap  . DILATION AND CURETTAGE OF UTERUS    . LAPAROSCOPIC APPENDECTOMY N/A 11/05/2017   Procedure: APPENDECTOMY LAPAROSCOPIC;  Surgeon: Coralie Keens, MD;  Location: [place] [facility];  Service: General;  Laterality: N/A;    Social History   Tobacco Use  . Smoking status:  Never Smoker  . Smokeless tobacco: Never Used  Substance Use Topics  . Alcohol use: Yes    Alcohol/week: 3.0 standard drinks    Types: 3 Standard drinks or equivalent per week    Family History  Problem Relation Age of Onset  . Diabetes Mother   . Cancer Mother        leukemia  . Lupus Mother   . Hypertension Mother   . Hypertension Father   . Breast cancer Maternal Aunt     Review of Systems  Constitutional: Positive for malaise/fatigue. Negative for chills and fever.  HENT: Positive for congestion and sinus pain. Negative for sore throat.   Respiratory: Positive for sputum production. Negative for cough and shortness of breath.   Cardiovascular: Negative for chest pain and palpitations.  Musculoskeletal: Negative for myalgias.  Neurological: Positive for dizziness and headaches.    Objective  Constitutional:      General: Not in acute distress.    Appearance: Normal appearance. Not ill-appearing.   Pulmonary:     Effort: Pulmonary effort is normal. No respiratory distress.  Neurological:     Mental Status: Alert and oriented to person, place, and time.  Psychiatric:        Mood and Affect: Mood normal.        Behavior: Behavior normal.     ASSESSMENT and PLAN  Problem List Items Addressed This Visit   None   Visit Diagnoses    Frontal headache    -  Primary   Relevant Medications   propranolol ER (INDERAL LA) 60 MG 24 hr capsule   COVID-19 virus infection       Relevant Medications   Guaifenesin (MUCINEX MAXIMUM STRENGTH) 1200 MG TB12     Work note sent RTC/ED precautions provided All questions answered R/se/b of medications discussed Discussed conservative treatment of symptoms: rest, fluids, OTC treatments  Return if symptoms worsen or fail to improve.    The above assessment and management plan was discussed with the patient. The patient verbalized understanding of and has agreed to the management plan. Patient is aware to call the clinic if  symptoms persist or worsen. Patient is aware when to return to the clinic for a follow-up visit. Patient educated on when it is appropriate to go to the emergency department.     Huston Foley Avin Gibbons, FNP-BC [facility] [place], [place] [address] Ph.  [phone] Fax [phone]

## 2020-12-04 NOTE — Patient Instructions (Addendum)
 COVID-19: What to Do if You Are Sick If you have a fever, cough or other symptoms, you might have COVID-19. Most people have mild illness and are able to recover at home. If you are sick:  Keep track of your symptoms.  If you have an emergency warning sign (including trouble breathing), call 911. Steps to help prevent the spread of COVID-19 if you are sick If you are sick with COVID-19 or think you might have COVID-19, follow the steps below to care for yourself and to help protect other people in your home and community. Stay home except to get medical care  Stay home. Most people with COVID-19 have mild illness and can recover at home without medical care. Do not leave your home, except to get medical care. Do not visit public areas.  Take care of yourself. Get rest and stay hydrated. Take over-the-counter medicines, such as acetaminophen, to help you feel better.  Stay in touch with your doctor. Call before you get medical care. Be sure to get care if you have trouble breathing, or have any other emergency warning signs, or if you think it is an emergency.  Avoid public transportation, ride-sharing, or taxis. Separate yourself from other people As much as possible, stay in a specific room and away from other people and pets in your home. If possible, you should use a separate bathroom. If you need to be around other people or animals in or outside of the home, wear a mask. Tell your close contactsthat they may have been exposed to COVID-19. An infected person can spread COVID-19 starting 48 hours (or 2 days) before the person has any symptoms or tests positive. By letting your close contacts know they may have been exposed to COVID-19, you are helping to protect everyone.  Additional guidance is available for those living in close quarters and shared housing.  See COVID-19 and Animals if you have questions about pets.  If you are diagnosed with COVID-19, someone from the health  department may call you. Answer the call to slow the spread. Monitor your symptoms  Symptoms of COVID-19 include fever, cough, or other symptoms.  Follow care instructions from your healthcare provider and local health department. Your local health authorities may give instructions on checking your symptoms and reporting information. When to seek emergency medical attention Look for emergency warning signs* for COVID-19. If someone is showing any of these signs, seek emergency medical care immediately:  Trouble breathing  Persistent pain or pressure in the chest  New confusion  Inability to wake or stay awake  Pale, gray, or blue-colored skin, lips, or nail beds, depending on skin tone *This list is not all possible symptoms. Please call your medical provider for any other symptoms that are severe or concerning to you. Call 911 or call ahead to your local emergency facility: Notify the operator that you are seeking care for someone who has or may have COVID-19. Call ahead before visiting your doctor  Call ahead. Many medical visits for routine care are being postponed or done by phone or telemedicine.  If you have a medical appointment that cannot be postponed, call your doctor's office, and tell them you have or may have COVID-19. This will help the office protect themselves and other patients. Get  tested  If you have symptoms of COVID-19, get tested. While waiting for test results, you stay away from others, including staying apart from those living in your household.  You can visit your state,   tribal, local, and territorialhealth department's website to look for the latest local information on testing sites. If you are sick, wear a mask over your nose and mouth  You should wear a mask over your nose and mouth if you must be around other people or animals, including pets (even at home).  You don't need to wear the mask if you are alone. If you can't put on a mask (because of  trouble breathing, for example), cover your coughs and sneezes in some other way. Try to stay at least 6 feet away from other people. This will help protect the people around you.  Masks should not be placed on young children under age 2 years, anyone who has trouble breathing, or anyone who is not able to remove the mask without help. Note: During the COVID-19 pandemic, medical grade facemasks are reserved for healthcare workers and some first responders. Cover your coughs and sneezes  Cover your mouth and nose with a tissue when you cough or sneeze.  Throw away used tissues in a lined trash can.  Immediately wash your hands with soap and water for at least 20 seconds. If soap and water are not available, clean your hands with an alcohol-based hand sanitizer that contains at least 60% alcohol. Clean your hands often  Wash your hands often with soap and water for at least 20 seconds. This is especially important after blowing your nose, coughing, or sneezing; going to the bathroom; and before eating or preparing food.  Use hand sanitizer if soap and water are not available. Use an alcohol-based hand sanitizer with at least 60% alcohol, covering all surfaces of your hands and rubbing them together until they feel dry.  Soap and water are the best option, especially if hands are visibly dirty.  Avoid touching your eyes, nose, and mouth with unwashed hands.  Handwashing Tips Avoid sharing personal household items  Do not share dishes, drinking glasses, cups, eating utensils, towels, or bedding with other people in your home.  Wash these items thoroughly after using them with soap and water or put in the dishwasher. Clean all "high-touch" surfaces everyday  Clean and disinfect high-touch surfaces in your "sick room" and bathroom; wear disposable gloves. Let someone else clean and disinfect surfaces in common areas, but you should clean your bedroom and bathroom, if possible.  If a  caregiver or other person needs to clean and disinfect a sick person's bedroom or bathroom, they should do so on an as-needed basis. The caregiver/other person should wear a mask and disposable gloves prior to cleaning. They should wait as long as possible after the person who is sick has used the bathroom before coming in to clean and use the bathroom. ? High-touch surfaces include phones, remote controls, counters, tabletops, doorknobs, bathroom fixtures, toilets, keyboards, tablets, and bedside tables.  Clean and disinfect areas that may have blood, stool, or body fluids on them.  Use household cleaners and disinfectants. Clean the area or item with soap and water or another detergent if it is dirty. Then, use a household disinfectant. ? Be sure to follow the instructions on the label to ensure safe and effective use of the product. Many products recommend keeping the surface wet for several minutes to ensure germs are killed. Many also recommend precautions such as wearing gloves and making sure you have good ventilation during use of the product. ? Use a product from EPA's List N: Disinfectants for Coronavirus (COVID-19). ? Complete Disinfection Guidance When you can   be around others after being sick with COVID-19 Deciding when you can be around others is different for different situations. Find out when you can safely end home isolation. For any additional questions about your care, contact your healthcare provider or state or local health department. 02/07/2020 Content source: National Center for Immunization and Respiratory Diseases (NCIRD), Division of Viral Diseases This information is not intended to replace advice given to you by your health care provider. Make sure you discuss any questions you have with your health care provider. Document Revised: 09/23/2020 Document Reviewed: 09/23/2020 Elsevier Patient Education  2021 Elsevier Inc.   If you have lab work done today you will be  contacted with your lab results within the next 2 weeks.  If you have not heard from us then please contact us. The fastest way to get your results is to register for My Chart.   IF you received an x-ray today, you will receive an invoice from [facility]. Please contact [facility] at [phone] with questions or concerns regarding your invoice.   IF you received labwork today, you will receive an invoice from [facility]. Please contact [facility] at [phone] with questions or concerns regarding your invoice.   Our billing staff will not be able to assist you with questions regarding bills from these companies.  You will be contacted with the lab results as soon as they are available. The fastest way to get your results is to activate your My Chart account. Instructions are located on the last page of this paperwork. If you have not heard from us regarding the results in 2 weeks, please contact this office.      

## 2020-12-18 ENCOUNTER — Ambulatory Visit: Payer: PRIVATE HEALTH INSURANCE | Admitting: Certified Nurse Midwife

## 2020-12-26 ENCOUNTER — Telehealth: Payer: Self-pay

## 2020-12-26 NOTE — Telephone Encounter (Signed)
Patient called about getting refill on Valacyclovir. Upon review of her med list Maximiano Coss, NP had sent her a prescription with 12 refills in Oct 2021.  She indicated that pharmacy said no refills available. I called and spoke with pharmacy. Evidently she had requested refill off of an old Rx and not the new one. They have the current Rx and will get it ready for her. Patient was informed.

## 2021-01-13 ENCOUNTER — Ambulatory Visit: Payer: PRIVATE HEALTH INSURANCE | Admitting: Nurse Practitioner

## 2021-01-14 NOTE — Progress Notes (Signed)
50 y.o. [id number] Married Black or [place] American female here for annual exam.     Has pain in back x 1 week. "5" on scale of 1-10. Tried Ibuprofen and helped a little  Patient's last menstrual period was 01/09/2021 (exact date).   Menses are regular. Not as heavy   Wants to be re-tested for BV and yeast today, is very prone. Reports urinary frequency, denies dysuria. Has concern about a feeling that is uncomfortable during sex.         Sexually active: Yes.    The current method of family planning is oral progesterone-only contraceptive.    Exercising: No.  exercise Smoker:  no  Health Maintenance: Pap:  12-09-2018 LGSIL HPV HR neg, 12-18-2019 neg HPV HR neg History of abnormal Pap:  yes MMG:  Waiting on fax from [place] 10/2020 Colonoscopy:  none BMD:   none TDaP:  2014 Gardasil:   n/a Covid-19: moderna Hep C testing: not done Screening Labs: with pcp   reports that she has never smoked. She has never used smokeless tobacco. She reports current alcohol use. She reports that she does not use drugs.  Past Medical History:  Diagnosis Date  . Abnormal Pap smear    many yrs ago, 2020 LGSIL  . Asthma   . Depression   . Fibroids   . Hypertension   . STD (sexually transmitted disease)    HSV2  . Vaginal wall cyst     Past Surgical History:  Procedure Laterality Date  . CHOLECYSTECTOMY N/A 05/11/2014   Procedure: LAPAROSCOPIC CHOLECYSTECTOMY WITH INTRAOPERATIVE CHOLANGIOGRAM;  Surgeon: Zenovia Jarred, MD;  Location: [place];  Service: General;  Laterality: N/A;  . CHOLECYSTECTOMY  04/2014  . COLPOSCOPY    . CRYOTHERAPY     for abnormal pap  . DILATION AND CURETTAGE OF UTERUS    . LAPAROSCOPIC APPENDECTOMY N/A 11/05/2017   Procedure: APPENDECTOMY LAPAROSCOPIC;  Surgeon: Coralie Keens, MD;  Location: [place];  Service: General;  Laterality: N/A;    Current Outpatient Medications  Medication Sig Dispense Refill  . albuterol (VENTOLIN HFA) 108 (90 Base) MCG/ACT inhaler  Inhale 2 puffs into the lungs every 6 (six) hours as needed for wheezing or shortness of breath. 18 g 5  . amLODipine (NORVASC) 5 MG tablet Take 1 tablet (5 mg total) by mouth daily. 90 tablet 3  . azelastine (ASTELIN) 0.1 % nasal spray Place 1 spray into both nostrils 2 (two) times daily. Use in each nostril as directed 30 mL 12  . cetirizine (ZYRTEC) 10 MG tablet Take 1 tablet (10 mg total) by mouth daily. 30 tablet 11  . citalopram (CELEXA) 10 MG tablet TAKE 1 TABLET(10 MG) BY MOUTH DAILY 90 tablet 3  . ibuprofen (ADVIL,MOTRIN) 200 MG tablet Take 200 mg by mouth as needed.    . Multiple Vitamins-Minerals (MULTIVITAMIN PO) Take 1 tablet by mouth daily as needed (supplemental).     . norethindrone (MICRONOR) 0.35 MG tablet Take 1 tablet (0.35 mg total) by mouth daily. 3 Package 4  . propranolol ER (INDERAL LA) 60 MG 24 hr capsule Take 1 capsule (60 mg total) by mouth daily. For headaches 30 capsule 0  . valACYclovir (VALTREX) 1000 MG tablet Take one daily at onset of outbreak 45 tablet 12   No current facility-administered medications for this visit.    Family History  Problem Relation Age of Onset  . Diabetes Mother   . Cancer Mother        leukemia  .  Lupus Mother   . Hypertension Mother   . Hypertension Father   . Breast cancer Maternal Aunt     Review of Systems  Constitutional: Negative.   HENT: Negative.   Eyes: Negative.   Respiratory: Negative.   Cardiovascular: Negative.   Gastrointestinal: Negative.   Endocrine: Negative.   Genitourinary:       Back pain on rt side  Musculoskeletal: Negative.   Skin: Negative.   Allergic/Immunologic: Negative.   Neurological: Negative.   Hematological: Negative.   Psychiatric/Behavioral: Negative.     Exam:   BP 106/68   Pulse 70   Resp 16   Ht 5' 2.25" (1.581 m)   Wt 189 lb (85.7 kg)   LMP 01/09/2021 (Exact Date)   BMI 34.29 kg/m   Height: 5' 2.25" (158.1 cm)  General appearance: alert, cooperative and appears stated  age, no acute distress Head: Normocephalic, without obvious abnormality Neck: no adenopathy, thyroid normal to inspection and palpation Lungs: clear to auscultation bilaterally Breasts: Normal to palpation without dominant masses, Taught monthly breast self examination Heart: regular rate and rhythm Abdomen: soft, non-tender; no masses,  no organomegaly Extremities: extremities normal, no edema Skin: No rashes or lesions Lymph nodes: Cervical, supraclavicular, and axillary nodes normal. No abnormal inguinal nodes palpated Neurologic: Grossly normal   Pelvic: External genitalia:  no lesions              Urethra:  normal appearing urethra with no masses, tenderness or lesions, Cystic structure palpable anterior vagina ?urethrocele?               Vagina: cystic structure anterior vagina, appropriate for age, yellowish discharge moderate amount, no lesions              Cervix: neg cervical motion tenderness, no visible lesions             Bimanual Exam:   Uterus:  normal size, contour, position, consistency, mobility, non-tender              Adnexa: no mass, fullness, tenderness                 Joy, CMA Chaperone was present for exam.  A/P:  Well woman exam with routine gynecological exam   - Plan: Cytology - PAP( [facility]), Hx: CIN 1 in 2020  Laboratory exam ordered as part of routine general medical examination - Plan: Urinalysis,Complete w/RFL Culture Back pain, unspecified back location, unspecified back pain laterality, unspecified chronicity - Plan: Urinalysis,Complete w/RFL Culture  Vaginal discharge - Plan: WET PREP FOR [place], YEAST, [place]- reassured normal  Cystitis - Plan: sulfamethoxazole-trimethoprim (BACTRIM DS) 800-160 MG tablet  Mammogram: done 10/2020, requested records  Encounter for surveillance of contraceptive pills - Plan: norethindrone (MICRONOR) 0.35 MG tablet  ? Urethrocele - Plan: Ambulatory referral to Urogynecology

## 2021-01-16 ENCOUNTER — Ambulatory Visit (INDEPENDENT_AMBULATORY_CARE_PROVIDER_SITE_OTHER): Payer: PRIVATE HEALTH INSURANCE | Admitting: Nurse Practitioner

## 2021-01-16 ENCOUNTER — Other Ambulatory Visit: Payer: Self-pay

## 2021-01-16 ENCOUNTER — Encounter: Payer: Self-pay | Admitting: Nurse Practitioner

## 2021-01-16 ENCOUNTER — Other Ambulatory Visit (HOSPITAL_COMMUNITY)
Admission: RE | Admit: 2021-01-16 | Discharge: 2021-01-16 | Disposition: A | Discharge status: Home / Self Care | Payer: PRIVATE HEALTH INSURANCE | Source: Ambulatory Visit | Attending: Nurse Practitioner | Admitting: Nurse Practitioner

## 2021-01-16 VITALS — BP 106/68 | HR 70 | Resp 16 | Ht 62.25 in | Wt 189.0 lb

## 2021-01-16 DIAGNOSIS — N898 Other specified noninflammatory disorders of vagina: Secondary | ICD-10-CM | POA: Diagnosis not present

## 2021-01-16 DIAGNOSIS — Z0000 Encounter for general adult medical examination without abnormal findings: Secondary | ICD-10-CM

## 2021-01-16 DIAGNOSIS — Z01419 Encounter for gynecological examination (general) (routine) without abnormal findings: Secondary | ICD-10-CM | POA: Diagnosis not present

## 2021-01-16 DIAGNOSIS — N3090 Cystitis, unspecified without hematuria: Secondary | ICD-10-CM

## 2021-01-16 DIAGNOSIS — M549 Dorsalgia, unspecified: Secondary | ICD-10-CM | POA: Diagnosis not present

## 2021-01-16 DIAGNOSIS — N810 Urethrocele: Secondary | ICD-10-CM

## 2021-01-16 DIAGNOSIS — Z3041 Encounter for surveillance of contraceptive pills: Secondary | ICD-10-CM

## 2021-01-16 LAB — WET PREP FOR TRICH, YEAST, CLUE

## 2021-01-16 MED ORDER — NORETHINDRONE 0.35 MG PO TABS: 1.0000 | ORAL_TABLET | Freq: Every day | ORAL | 4 refills | Status: DC

## 2021-01-16 MED ORDER — SULFAMETHOXAZOLE-TRIMETHOPRIM 800-160 MG PO TABS: 1.0000 | ORAL_TABLET | Freq: Two times a day (BID) | ORAL | 0 refills | Status: DC

## 2021-01-16 NOTE — Patient Instructions (Signed)
Health Maintenance, Female Adopting a healthy lifestyle and getting preventive care are important in promoting health and wellness. Ask your health care provider about:  The right schedule for you to have regular tests and exams.  Things you can do on your own to prevent diseases and keep yourself healthy. What should I know about diet, weight, and exercise? Eat a healthy diet  Eat a diet that includes plenty of vegetables, fruits, low-fat dairy products, and lean protein.  Do not eat a lot of foods that are high in solid fats, added sugars, or sodium.   Maintain a healthy weight Body mass index (BMI) is used to identify weight problems. It estimates body fat based on height and weight. Your health care provider can help determine your BMI and help you achieve or maintain a healthy weight. Get regular exercise Get regular exercise. This is one of the most important things you can do for your health. Most adults should:  Exercise for at least 150 minutes each week. The exercise should increase your heart rate and make you sweat (moderate-intensity exercise).  Do strengthening exercises at least twice a week. This is in addition to the moderate-intensity exercise.  Spend less time sitting. Even light physical activity can be beneficial. Watch cholesterol and blood lipids Have your blood tested for lipids and cholesterol at 48 years of age, then have this test every 5 years. Have your cholesterol levels checked more often if:  Your lipid or cholesterol levels are high.  You are older than 48 years of age.  You are at high risk for heart disease. What should I know about cancer screening? Depending on your health history and family history, you may need to have cancer screening at various ages. This may include screening for:  Breast cancer.  Cervical cancer.  Colorectal cancer.  Skin cancer.  Lung cancer. What should I know about heart disease, diabetes, and high blood  pressure? Blood pressure and heart disease  High blood pressure causes heart disease and increases the risk of stroke. This is more likely to develop in people who have high blood pressure readings, are of African descent, or are overweight.  Have your blood pressure checked: ? Every 3-5 years if you are 18-39 years of age. ? Every year if you are 40 years old or older. Diabetes Have regular diabetes screenings. This checks your fasting blood sugar level. Have the screening done:  Once every three years after age 40 if you are at a normal weight and have a low risk for diabetes.  More often and at a younger age if you are overweight or have a high risk for diabetes. What should I know about preventing infection? Hepatitis B If you have a higher risk for hepatitis B, you should be screened for this virus. Talk with your health care provider to find out if you are at risk for hepatitis B infection. Hepatitis C Testing is recommended for:  Everyone born from 1945 through 1965.  Anyone with known risk factors for hepatitis C. Sexually transmitted infections (STIs)  Get screened for STIs, including gonorrhea and chlamydia, if: ? You are sexually active and are younger than 48 years of age. ? You are older than 48 years of age and your health care provider tells you that you are at risk for this type of infection. ? Your sexual activity has changed since you were last screened, and you are at increased risk for chlamydia or gonorrhea. Ask your health care provider   if you are at risk.  Ask your health care provider about whether you are at high risk for HIV. Your health care provider may recommend a prescription medicine to help prevent HIV infection. If you choose to take medicine to prevent HIV, you should first get tested for HIV. You should then be tested every 3 months for as long as you are taking the medicine. Pregnancy  If you are about to stop having your period (premenopausal) and  you may become pregnant, seek counseling before you get pregnant.  Take 400 to 800 micrograms (mcg) of folic acid every day if you become pregnant.  Ask for birth control (contraception) if you want to prevent pregnancy. Osteoporosis and menopause Osteoporosis is a disease in which the bones lose minerals and strength with aging. This can result in bone fractures. If you are 65 years old or older, or if you are at risk for osteoporosis and fractures, ask your health care provider if you should:  Be screened for bone loss.  Take a calcium or vitamin D supplement to lower your risk of fractures.  Be given hormone replacement therapy (HRT) to treat symptoms of menopause. Follow these instructions at home: Lifestyle  Do not use any products that contain nicotine or tobacco, such as cigarettes, e-cigarettes, and chewing tobacco. If you need help quitting, ask your health care provider.  Do not use street drugs.  Do not share needles.  Ask your health care provider for help if you need support or information about quitting drugs. Alcohol use  Do not drink alcohol if: ? Your health care provider tells you not to drink. ? You are pregnant, may be pregnant, or are planning to become pregnant.  If you drink alcohol: ? Limit how much you use to 0-1 drink a day. ? Limit intake if you are breastfeeding.  Be aware of how much alcohol is in your drink. In the [place] one drink equals one 12 oz bottle of beer (355 mL), one 5 oz glass of wine (148 mL), or one 1 oz glass of hard liquor (44 mL). General instructions  Schedule regular health, dental, and eye exams.  Stay current with your vaccines.  Tell your health care provider if: ? You often feel depressed. ? You have ever been abused or do not feel safe at home. Summary  Adopting a healthy lifestyle and getting preventive care are important in promoting health and wellness.  Follow your health care provider's instructions about healthy  diet, exercising, and getting tested or screened for diseases.  Follow your health care provider's instructions on monitoring your cholesterol and blood pressure. This information is not intended to replace advice given to you by your health care provider. Make sure you discuss any questions you have with your health care provider. Document Revised: 11/02/2018 Document Reviewed: 11/02/2018 Elsevier Patient Education  2021 Elsevier Inc.  

## 2021-01-17 LAB — CYTOLOGY - PAP
Adequacy: ABSENT
Comment: NEGATIVE
Diagnosis: NEGATIVE
High risk HPV: NEGATIVE

## 2021-01-18 LAB — URINALYSIS, COMPLETE W/RFL CULTURE
Bilirubin Urine: NEGATIVE
Glucose, UA: NEGATIVE
Hyaline Cast: NONE SEEN /LPF
Ketones, ur: NEGATIVE
Nitrites, Initial: NEGATIVE
Protein, ur: NEGATIVE
Specific Gravity, Urine: 1.02 (ref 1.001–1.03)
pH: 7 (ref 5.0–8.0)

## 2021-01-18 LAB — URINE CULTURE
MICRO NUMBER:: 11574735
Result:: NO GROWTH
SPECIMEN QUALITY:: ADEQUATE

## 2021-01-18 LAB — CULTURE INDICATED

## 2021-01-24 ENCOUNTER — Telehealth: Payer: Self-pay | Admitting: *Deleted

## 2021-01-24 DIAGNOSIS — N810 Urethrocele: Secondary | ICD-10-CM

## 2021-01-24 NOTE — Telephone Encounter (Signed)
Patient called stating she never heard back from [facility] gynecology office regarding referral on 01/16/21. I provided patient with new number and placed a new referral just incase Uro-gynecology was not able to see old referral .

## 2021-01-28 NOTE — Telephone Encounter (Signed)
Patient scheduled on 02/21/21 @ 8:00 am

## 2021-02-20 NOTE — Progress Notes (Signed)
[facility] New Patient Evaluation and Consultation  Referring Provider: Karma Ganja, NP PCP: Maximiano Coss, NP Date of Service: 02/21/2021  SUBJECTIVE Chief Complaint: New Patient (Initial Visit) Beth Frost is a 50 y.o. female complains of cyst on her bladder.)  History of Present Illness: Beth Frost is a 50 y.o. Black or African-American female seen in consultation at the request of NP Karma Ganja for evaluation of possible urethrocele.    Review of records significant for: Palpable cystic structure on anterior vagina under urethra.   Urinary Symptoms: Leaks urine out of the blue. Sometimes she has some urgency after urinating. Leaks 1 time(s) per month.  Pad use:  liners/ mini-pads per day.   She is not bothered by her UI symptoms.  Day time voids 5.  Nocturia: 1 times per night to void. Voiding dysfunction: she empties her bladder well.  does not use a catheter to empty bladder.  When urinating, she feels she has no difficulties  UTIs: 3 UTI's in the last year- reports that she had cultures but not sure where they were done.   01/16/21- no growth 12/18/19- lactobacillis  Denies history of blood in urine and kidney or bladder stones  Pelvic Organ Prolapse Symptoms:                  She Denies a feeling of a bulge the vaginal area.  Bowel Symptom: Bowel movements: regular Stool consistency: soft  Straining: no.  Splinting: no.  Incomplete evacuation: no.  She Denies accidental bowel leakage / fecal incontinence Bowel regimen: none Last colonoscopy: n/a  Sexual Function Sexually active: yes.  Sexual orientation: Straight Pain with sex: Yes, deep in the pelvis  Pelvic Pain Admits to pelvic pain Location: right side Pain occurs: with sex Prior pain treatment: none   Past Medical History:  Past Medical History:  Diagnosis Date  . Abnormal Pap smear    many yrs ago, 2020 LGSIL  . Asthma   . Depression   . Fibroids   . Hypertension    . STD (sexually transmitted disease)    HSV2  . Vaginal wall cyst      Past Surgical History:   Past Surgical History:  Procedure Laterality Date  . CHOLECYSTECTOMY N/A 05/11/2014   Procedure: LAPAROSCOPIC CHOLECYSTECTOMY WITH INTRAOPERATIVE CHOLANGIOGRAM;  Surgeon: Zenovia Jarred, MD;  Location: [place];  Service: General;  Laterality: N/A;  . CHOLECYSTECTOMY  04/2014  . COLPOSCOPY    . CRYOTHERAPY     for abnormal pap  . DILATION AND CURETTAGE OF UTERUS    . LAPAROSCOPIC APPENDECTOMY N/A 11/05/2017   Procedure: APPENDECTOMY LAPAROSCOPIC;  Surgeon: Coralie Keens, MD;  Location: [place];  Service: General;  Laterality: N/A;     Past OB/GYN History:  OB History  Gravida Para Term Preterm AB Living  4 2 2   2 2   SAB IAB Ectopic Multiple Live Births  1 1     2     # Outcome Date GA Lbr Len/2nd Weight Sex Delivery Anes PTL Lv  4 IAB           3 SAB           2 Term     M Vag-Spont   LIV  1 Term     F Vag-Spont   LIV    Menopausal: No, LMP 2 weeks ago Contraception: micronor Last pap smear was 01/16/21.  Any history of abnormal pap smears: no.   Medications: She has a current  medication list which includes the following prescription(s): albuterol, amlodipine, azelastine, cetirizine, citalopram, ibuprofen, multiple vitamin, norethindrone, valacyclovir, propranolol er, and sulfamethoxazole-trimethoprim.   Allergies: Patient is allergic to flagyl [metronidazole].   Social History:  Social History   Tobacco Use  . Smoking status: Never Smoker  . Smokeless tobacco: Never Used  Vaping Use  . Vaping Use: Never used  Substance Use Topics  . Alcohol use: Yes    Comment: 1 a day  . Drug use: No    She is employed [employer]. Regular exercise: Yes:   History of abuse: No  Family History:   Family History  Problem Relation Age of Onset  . Diabetes Mother   . Cancer Mother        leukemia  . Lupus Mother   . Hypertension Mother   . Hypertension Father   . Breast  cancer Maternal Aunt      Review of Systems: Review of Systems  Constitutional: Positive for malaise/fatigue. Negative for fever and weight loss.  Respiratory: Negative for cough, shortness of breath and wheezing.   Cardiovascular: Negative for chest pain, palpitations and leg swelling.  Gastrointestinal: Negative for abdominal pain and blood in stool.  Genitourinary: Negative for dysuria.  Musculoskeletal: Negative for myalgias.  Skin: Negative for rash.  Neurological: Negative for dizziness and headaches.  Endo/Heme/Allergies: Does not bruise/bleed easily.  Psychiatric/Behavioral: Positive for depression. The patient is not nervous/anxious.      OBJECTIVE Physical Exam: Vitals:   02/21/21 1058  BP: 123/78  Pulse: 72  Height: 5\' 2"  (1.575 m)    Physical Exam Constitutional:      General: She is not in acute distress. Pulmonary:     Effort: Pulmonary effort is normal.  Abdominal:     General: There is no distension.     Palpations: Abdomen is soft.     Tenderness: There is no abdominal tenderness. There is no rebound.  Musculoskeletal:        General: No swelling. Normal range of motion.  Skin:    General: Skin is warm and dry.     Findings: No rash.  Neurological:     Mental Status: She is alert and oriented to person, place, and time.  Psychiatric:        Mood and Affect: Mood normal.        Behavior: Behavior normal.    GU / Detailed Urogynecologic Evaluation:  Pelvic Exam: Normal external female genitalia; Bartholin's and Skene's glands normal in appearance; urethral meatus normal in appearance, no urethral masses or discharge. No cystic structure visible or palpable under the urethra.   CST: negative  Speculum exam reveals normal vaginal mucosa without atrophy. Cervix normal appearance. Uterus normal single, nontender. Adnexa no mass, fullness, tenderness.     Pelvic floor strength III/V  Pelvic floor musculature: Right levator with increased tone and  tender, Right obturator tender, Left levator with increased tone but non-tender, Left obturator non-tender  POP-Q:   POP-Q  -2.5                                            Aa   -2.5  Ba  -7.5                                              C   4                                            Gh  4                                            Pb  9                                            tvl   -3                                            Ap  -3                                            Bp  -9                                              D     Rectal Exam:  Normal external rectum  Post-Void Residual (PVR) by Bladder Scan: In order to evaluate bladder emptying, we discussed obtaining a postvoid residual and she agreed to this procedure.  Procedure: The ultrasound unit was placed on the patient's abdomen in the suprapubic region after the patient had voided. A PVR of 57 ml was obtained by bladder scan.  Laboratory Results: POC urine: negative  I visualized the urine specimen, noting the specimen to be dark yellow  ASSESSMENT AND PLAN Beth Frost is a 50 y.o. with:  1. Dyspareunia, female   2. Levator spasm   3. Urinary urgency     1. Dyspareunia/ levator spasm - The origin of pelvic floor muscle spasm can be multifactorial, including primary, reactive to a different pain source, trauma, or even part of a centralized pain syndrome.Treatment options include pelvic floor physical therapy, local (vaginal) or oral  muscle relaxants, pelvic muscle trigger point injections or centrally acting pain medications.   - She would like to start with pelvic floor physical therapy, referral placed.   2. Urinary urgency - Does not occur often, but may be related to pelvic floor muscle spasm.  - Did not note any sign of urethrocele or cystic urethral lesion on today's exam. Discussed with patient that if there were a cyst previously, it could have  reduced in size and not be visible a this time. If she notices any bulging or cystic structure, she can return for an exam when it is present and we can readdress, otherwise there is no need for intervention  at this time.   Return 4 months to follow up on progress with physical therapy.   Beth Folds, MD   Medical Decision Making:  - Reviewed/ ordered a clinical laboratory test - Review and summation of prior records

## 2021-02-21 ENCOUNTER — Other Ambulatory Visit: Payer: Self-pay

## 2021-02-21 ENCOUNTER — Encounter: Payer: Self-pay | Admitting: Obstetrics and Gynecology

## 2021-02-21 ENCOUNTER — Ambulatory Visit (INDEPENDENT_AMBULATORY_CARE_PROVIDER_SITE_OTHER): Payer: PRIVATE HEALTH INSURANCE | Admitting: Obstetrics and Gynecology

## 2021-02-21 VITALS — BP 123/78 | HR 72 | Ht 62.0 in

## 2021-02-21 DIAGNOSIS — R3915 Urgency of urination: Secondary | ICD-10-CM

## 2021-02-21 DIAGNOSIS — N941 Unspecified dyspareunia: Secondary | ICD-10-CM

## 2021-02-21 DIAGNOSIS — M62838 Other muscle spasm: Secondary | ICD-10-CM | POA: Diagnosis not present

## 2021-02-21 LAB — POCT URINALYSIS DIPSTICK
Appearance: ABNORMAL
Bilirubin, UA: NEGATIVE
Blood, UA: NEGATIVE
Glucose, UA: NEGATIVE
Ketones, UA: NEGATIVE
Leukocytes, UA: NEGATIVE
Nitrite, UA: NEGATIVE
Protein, UA: NEGATIVE
Spec Grav, UA: 1.02 (ref 1.010–1.025)
Urobilinogen, UA: 0.2 E.U./dL
pH, UA: 6.5 (ref 5.0–8.0)

## 2021-04-28 ENCOUNTER — Telehealth: Payer: Self-pay | Admitting: Registered Nurse

## 2021-04-28 ENCOUNTER — Other Ambulatory Visit: Payer: Self-pay

## 2021-04-28 DIAGNOSIS — I10 Essential (primary) hypertension: Secondary | ICD-10-CM

## 2021-04-28 MED ORDER — AMLODIPINE BESYLATE 5 MG PO TABS: 5.0000 mg | ORAL_TABLET | Freq: Every day | ORAL | 0 refills | Status: DC

## 2021-04-28 NOTE — Telephone Encounter (Signed)
..  Medication Refills  Last OV:  Medication:  Amlodipine  Pharmacy:[facility] on [address]  Let patient know to contact pharmacy at the end of the day to make sure medication is ready.   Please notify patient to allow 48-72 hours to process.  Encourage patient to contact the pharmacy for refills or they can request refills through [place] out below:   Last refill:  QTY:  Refill Date:    Other Comments:   Okay for refill?  Please advise.

## 2021-04-28 NOTE — Telephone Encounter (Signed)
Medication sent to the pharmacy.

## 2021-04-29 ENCOUNTER — Encounter: Payer: Self-pay | Admitting: Registered Nurse

## 2021-04-29 ENCOUNTER — Telehealth (INDEPENDENT_AMBULATORY_CARE_PROVIDER_SITE_OTHER): Payer: PRIVATE HEALTH INSURANCE | Admitting: Registered Nurse

## 2021-04-29 ENCOUNTER — Other Ambulatory Visit: Payer: Self-pay

## 2021-04-29 VITALS — Wt 180.0 lb

## 2021-04-29 DIAGNOSIS — J069 Acute upper respiratory infection, unspecified: Secondary | ICD-10-CM | POA: Diagnosis not present

## 2021-04-29 DIAGNOSIS — J302 Other seasonal allergic rhinitis: Secondary | ICD-10-CM | POA: Diagnosis not present

## 2021-04-29 MED ORDER — PREDNISONE 20 MG PO TABS: 20.0000 mg | ORAL_TABLET | Freq: Every day | ORAL | 0 refills | Status: DC

## 2021-04-29 MED ORDER — GUAIFENESIN-DM 100-10 MG/5ML PO SYRP: 5.0000 mL | ORAL_SOLUTION | ORAL | 0 refills | Status: DC | PRN

## 2021-04-29 MED ORDER — FLUTICASONE PROPIONATE 50 MCG/ACT NA SUSP: 2.0000 | Freq: Every day | NASAL | 6 refills | Status: DC

## 2021-04-29 NOTE — Patient Instructions (Signed)
° ° ° °  If you have lab work done today you will be contacted with your lab results within the next 2 weeks.  If you have not heard from us then please contact us. The fastest way to get your results is to register for My Chart. ° ° °IF you received an x-ray today, you will receive an invoice from [facility]. Please contact [facility] at [phone] with questions or concerns regarding your invoice.  ° °IF you received labwork today, you will receive an invoice from [facility]. Please contact [facility] at [phone] with questions or concerns regarding your invoice.  ° °Our billing staff will not be able to assist you with questions regarding bills from these companies. ° °You will be contacted with the lab results as soon as they are available. The fastest way to get your results is to activate your My Chart account. Instructions are located on the last page of this paperwork. If you have not heard from us regarding the results in 2 weeks, please contact this office. °  ° ° ° °

## 2021-04-29 NOTE — Progress Notes (Signed)
Telemedicine Encounter- SOAP NOTE Established Patient  This telephone encounter was conducted with the patient's (or proxy's) verbal consent via audio telecommunications: yes  Patient was instructed to have this encounter in a suitably private space; and to only have persons present to whom they give permission to participate. In addition, patient identity was confirmed by use of name plus two identifiers (DOB and address).  I discussed the limitations, risks, security and privacy concerns of performing an evaluation and management service by telephone and the availability of in person appointments. I also discussed with the patient that there may be a patient responsible charge related to this service. The patient expressed understanding and agreed to proceed.  I spent a total of 15 minutes talking with the patient or their proxy.  Patient at home Provider in office  Participants: Beth Ruddy, NP and Beth Frost  Chief Complaint  Patient presents with  . Recurrent Sinusitis    Patient states since last Friday she has been having allergy problems. Patient states she has been experiencing a stuffy nose, ear congestion, coughing up yellow/ greenish cold and has tried multiple OTC allergy medications with no relief.    Subjective   Beth Frost is a 50 y.o. established patient. Telephone visit today for sinus pressure  HPI Sinus pressure, ear pressure, rhinorrhea, sinus headache No coughing, chest congestion, shob, doe, or sore throat. No sick contacts, has been vaccinated against covid with booster No nvd, fatigue, chills, sweats, sensory changes  Patient Active Problem List   Diagnosis Date Noted  . Fever, unspecified 06/01/2019  . Malaise 06/01/2019  . Asthma 05/13/2019  . Hypertensive disorder 05/13/2019  . Acute appendicitis 11/04/2017  . Essential hypertension 11/26/2016    Class: Family History of  . Herpes genitalis in women 07/18/2014    Class: History of     Past Medical History:  Diagnosis Date  . Abnormal Pap smear    many yrs ago, 2020 LGSIL  . Asthma   . Depression   . Fibroids   . Hypertension   . STD (sexually transmitted disease)    HSV2  . Vaginal wall cyst     Current Outpatient Medications  Medication Sig Dispense Refill  . albuterol (VENTOLIN HFA) 108 (90 Base) MCG/ACT inhaler Inhale 2 puffs into the lungs every 6 (six) hours as needed for wheezing or shortness of breath. 18 g 5  . amLODipine (NORVASC) 5 MG tablet Take 1 tablet (5 mg total) by mouth daily. 90 tablet 0  . azelastine (ASTELIN) 0.1 % nasal spray Place 1 spray into both nostrils 2 (two) times daily. Use in each nostril as directed 30 mL 12  . cetirizine (ZYRTEC) 10 MG tablet Take 1 tablet (10 mg total) by mouth daily. 30 tablet 11  . citalopram (CELEXA) 10 MG tablet TAKE 1 TABLET(10 MG) BY MOUTH DAILY 90 tablet 3  . fluticasone (FLONASE) 50 MCG/ACT nasal spray Place 2 sprays into both nostrils daily. 16 g 6  . guaiFENesin-dextromethorphan (ROBITUSSIN DM) 100-10 MG/5ML syrup Take 5 mLs by mouth every 4 (four) hours as needed for cough. 118 mL 0  . ibuprofen (ADVIL,MOTRIN) 200 MG tablet Take 200 mg by mouth as needed.    . Multiple Vitamins-Minerals (MULTIVITAMIN PO) Take 1 tablet by mouth daily as needed (supplemental).     . norethindrone (MICRONOR) 0.35 MG tablet Take 1 tablet (0.35 mg total) by mouth daily. 84 tablet 4  . predniSONE (DELTASONE) 20 MG tablet Take 1 tablet (20 mg  total) by mouth daily with breakfast. 5 tablet 0  . valACYclovir (VALTREX) 1000 MG tablet Take one daily at onset of outbreak 45 tablet 12  . propranolol ER (INDERAL LA) 60 MG 24 hr capsule Take 1 capsule (60 mg total) by mouth daily. For headaches (Patient not taking: No sig reported) 30 capsule 0  . sulfamethoxazole-trimethoprim (BACTRIM DS) 800-160 MG tablet Take 1 tablet by mouth 2 (two) times daily. One PO BID x 3 days (Patient not taking: No sig reported) 6 tablet 0   No current  facility-administered medications for this visit.    Allergies  Allergen Reactions  . Flagyl [Metronidazole] Hives    Itchy all over    Social History   Socioeconomic History  . Marital status: Married    Spouse name: Not on file  . Number of children: Not on file  . Years of education: Not on file  . Highest education level: Not on file  Occupational History  . Not on file  Tobacco Use  . Smoking status: Never Smoker  . Smokeless tobacco: Never Used  Vaping Use  . Vaping Use: Never used  Substance and Sexual Activity  . Alcohol use: Yes    Comment: 1 a day  . Drug use: No  . Sexual activity: Yes    Partners: Male    Birth control/protection: Pill  Other Topics Concern  . Not on file  Social History Narrative  . Not on file   Social Determinants of Health   Financial Resource Strain: Not on file  Food Insecurity: Not on file  Transportation Needs: Not on file  Physical Activity: Not on file  Stress: Not on file  Social Connections: Not on file  Intimate Partner Violence: Not on file    ROS Per hpi   Objective   Vitals as reported by the patient: Today's Vitals   04/29/21 0856  Weight: 180 lb (81.6 kg)    [name] was seen today for recurrent sinusitis.  Diagnoses and all orders for this visit:  Seasonal allergies -     predniSONE (DELTASONE) 20 MG tablet; Take 1 tablet (20 mg total) by mouth daily with breakfast. -     fluticasone (FLONASE) 50 MCG/ACT nasal spray; Place 2 sprays into both nostrils daily. -     guaiFENesin-dextromethorphan (ROBITUSSIN DM) 100-10 MG/5ML syrup; Take 5 mLs by mouth every 4 (four) hours as needed for cough.  Viral URI -     predniSONE (DELTASONE) 20 MG tablet; Take 1 tablet (20 mg total) by mouth daily with breakfast. -     fluticasone (FLONASE) 50 MCG/ACT nasal spray; Place 2 sprays into both nostrils daily. -     guaiFENesin-dextromethorphan (ROBITUSSIN DM) 100-10 MG/5ML syrup; Take 5 mLs by mouth every 4 (four) hours  as needed for cough.   PLAN  Viral URI vs allergies. Treat with prednisone burst, flonase, continue zyrtec and azelastine.  If persistent towards end of week, can add in augmentin po bid x 7 days for suspected progression to bacterial infection  Return precautions discussed  Low suspicion for covid  Patient encouraged to call clinic with any questions, comments, or concerns.   I discussed the assessment and treatment plan with the patient. The patient was provided an opportunity to ask questions and all were answered. The patient agreed with the plan and demonstrated an understanding of the instructions.   The patient was advised to call back or seek an in-person evaluation if the symptoms worsen or if the  condition fails to improve as anticipated.  I provided 15 minutes of non-face-to-face time during this encounter.  Maximiano Coss, NP  Primary Care at [facility]

## 2021-06-20 NOTE — Progress Notes (Deleted)
[facility] Return Visit  SUBJECTIVE  History of Present Illness: GEARL KINGHORN is a 50 y.o. female seen in follow-up for dyspareunia and levator spasm. Plan at last visit was to start pelvic floor physical therapy- she did not attend.     Past Medical History: Patient  has a past medical history of Abnormal Pap smear, Asthma, Depression, Fibroids, Hypertension, STD (sexually transmitted disease), and Vaginal wall cyst.   Past Surgical History: She  has a past surgical history that includes Colposcopy; Cryotherapy; Dilation and curettage of uterus; Cholecystectomy (N/A, 05/11/2014); Cholecystectomy (04/2014); and laparoscopic appendectomy (N/A, 11/05/2017).   Medications: She has a current medication list which includes the following prescription(s): albuterol, amlodipine, azelastine, cetirizine, citalopram, fluticasone, guaifenesin-dextromethorphan, ibuprofen, multiple vitamin, norethindrone, prednisone, propranolol er, sulfamethoxazole-trimethoprim, and valacyclovir.   Allergies: Patient is allergic to flagyl [metronidazole].   Social History: Patient  reports that she has never smoked. She has never used smokeless tobacco. She reports current alcohol use. She reports that she does not use drugs.      OBJECTIVE     Physical Exam: There were no vitals filed for this visit. Gen: No apparent distress, A&O x 3.  Detailed Urogynecologic Evaluation:  Deferred. Prior exam showed:  No flowsheet data found.     ASSESSMENT AND PLAN    Ms. Condren is a 50 y.o. with:  No diagnosis found.

## 2021-06-24 ENCOUNTER — Ambulatory Visit: Payer: PRIVATE HEALTH INSURANCE | Admitting: Obstetrics and Gynecology

## 2021-07-09 NOTE — Progress Notes (Signed)
Beth Frost Return Visit  SUBJECTIVE  History of Present Illness: Beth Frost is a 50 y.o. female seen in follow-up for dyspareunia, levator spasm. Plan at last visit was was to start physical therapy but she did not attend (changed her mind).  She still is having discomfort with intercourse, deep in the pelvis.    Past Medical History: Patient  has a past medical history of Abnormal Pap smear, Asthma, Depression, Fibroids, Hypertension, STD (sexually transmitted disease), and Vaginal wall cyst.   Past Surgical History: She  has a past surgical history that includes Colposcopy; Cryotherapy; Dilation and curettage of uterus; Cholecystectomy (N/A, 05/11/2014); Cholecystectomy (04/2014); and laparoscopic appendectomy (N/A, 11/05/2017).   Medications: She has a current medication list which includes the following prescription(s): cyclobenzaprine, albuterol, amlodipine, azelastine, cetirizine, citalopram, fluticasone, guaifenesin-dextromethorphan, ibuprofen, multiple vitamin, norethindrone, prednisone, propranolol er, sulfamethoxazole-trimethoprim, and valacyclovir.   Allergies: Patient is allergic to flagyl [metronidazole].   Social History: Patient  reports that she has never smoked. She has never used smokeless tobacco. She reports current alcohol use. She reports that she does not use drugs.      OBJECTIVE     Physical Exam: Vitals:   07/10/21 1508  BP: 109/70  Pulse: 61   Gen: No apparent distress, A&O x 3.  Detailed Urogynecologic Evaluation:  Deferred.    ASSESSMENT AND PLAN    Beth Frost is a 50 y.o. with:  1. Levator spasm   2. Dyspareunia, female    - We discussed the use of muscle relaxants to use prior to intercourse to help relax the pelvic floor muscles. Flexeril '5mg'$  tab ordered. She should start these at night to assess if they cause any drowsiness.  - Also recommended using dilators or a pelvic wand to help massage the pelvic floor muscles.  She should do this daily to help relax muscles, or can also be done prior to intercourse.   Return 2 months for follow up.   [name], MD  Time spent: I spent 20 minutes dedicated to the care of this patient on the date of this encounter to include pre-visit review of records, face-to-face time with the patient and post visit documentation and ordering medication/ testing.

## 2021-07-10 ENCOUNTER — Ambulatory Visit (INDEPENDENT_AMBULATORY_CARE_PROVIDER_SITE_OTHER): Payer: PRIVATE HEALTH INSURANCE | Admitting: Obstetrics and Gynecology

## 2021-07-10 ENCOUNTER — Encounter: Payer: Self-pay | Admitting: Obstetrics and Gynecology

## 2021-07-10 ENCOUNTER — Other Ambulatory Visit: Payer: Self-pay

## 2021-07-10 VITALS — BP 109/70 | HR 61

## 2021-07-10 DIAGNOSIS — M62838 Other muscle spasm: Secondary | ICD-10-CM | POA: Diagnosis not present

## 2021-07-10 DIAGNOSIS — N941 Unspecified dyspareunia: Secondary | ICD-10-CM | POA: Diagnosis not present

## 2021-07-10 MED ORDER — CYCLOBENZAPRINE HCL 5 MG PO TABS: 5.0000 mg | ORAL_TABLET | Freq: Three times a day (TID) | ORAL | 1 refills | Status: DC | PRN

## 2021-08-14 ENCOUNTER — Ambulatory Visit: Payer: PRIVATE HEALTH INSURANCE | Admitting: Registered Nurse

## 2021-08-14 ENCOUNTER — Encounter: Payer: Self-pay | Admitting: Registered Nurse

## 2021-08-14 ENCOUNTER — Other Ambulatory Visit: Payer: Self-pay

## 2021-08-14 VITALS — BP 118/69 | HR 81 | Temp 98.2°F | Resp 17 | Ht 62.0 in | Wt 184.6 lb

## 2021-08-14 DIAGNOSIS — Z1329 Encounter for screening for other suspected endocrine disorder: Secondary | ICD-10-CM

## 2021-08-14 DIAGNOSIS — R519 Headache, unspecified: Secondary | ICD-10-CM

## 2021-08-14 DIAGNOSIS — E559 Vitamin D deficiency, unspecified: Secondary | ICD-10-CM | POA: Diagnosis not present

## 2021-08-14 DIAGNOSIS — Z13220 Encounter for screening for lipoid disorders: Secondary | ICD-10-CM

## 2021-08-14 DIAGNOSIS — Z130 Encounter for screening for diseases of the blood and blood-forming organs and certain disorders involving the immune mechanism: Secondary | ICD-10-CM

## 2021-08-14 DIAGNOSIS — L309 Dermatitis, unspecified: Secondary | ICD-10-CM

## 2021-08-14 DIAGNOSIS — M25562 Pain in left knee: Secondary | ICD-10-CM

## 2021-08-14 DIAGNOSIS — I10 Essential (primary) hypertension: Secondary | ICD-10-CM

## 2021-08-14 DIAGNOSIS — Z13228 Encounter for screening for other metabolic disorders: Secondary | ICD-10-CM

## 2021-08-14 DIAGNOSIS — G8929 Other chronic pain: Secondary | ICD-10-CM

## 2021-08-14 MED ORDER — DICLOFENAC SODIUM 75 MG PO TBEC: 75.0000 mg | DELAYED_RELEASE_TABLET | Freq: Two times a day (BID) | ORAL | 0 refills | Status: DC

## 2021-08-14 MED ORDER — TRIAMCINOLONE ACETONIDE 0.1 % EX CREA: 1.0000 "application " | TOPICAL_CREAM | Freq: Two times a day (BID) | CUTANEOUS | 0 refills | Status: DC

## 2021-08-14 MED ORDER — AMLODIPINE BESYLATE 5 MG PO TABS
5.0000 mg | ORAL_TABLET | Freq: Every day | ORAL | 0 refills | Status: DC
Start: 2021-08-14 — End: 2022-01-12

## 2021-08-14 MED ORDER — PROPRANOLOL HCL ER 60 MG PO CP24: 60.0000 mg | ORAL_CAPSULE | Freq: Every day | ORAL | 0 refills | Status: DC

## 2021-08-14 NOTE — Progress Notes (Signed)
Established Patient Office Visit  Subjective:  Patient ID: Beth Frost, female    DOB: Jul 31, 1971  Age: 50 y.o. MRN: [id number]  CC:  Chief Complaint  Patient presents with   Knee Pain    Patient states she has been experiencing some knee pain in her left knee.patient states it keeps swelling and feels like it will give out.  Also to discuss bp medication.    HPI Beth Frost presents for knee pain and HTN  Knee pain L knee pain, intermittent Frontal and lateral at joint line Occ swelling. Pain relieved with ibuprofen No radiation No acute injury  Hypertension: Patient Currently taking: amlodipine 5mg  po qd Good effect. No AEs. Denies CV symptoms including: chest pain, shob, doe, headache, visual changes, fatigue, claudication, and dependent edema.   Previous readings and labs: BP Readings from Last 3 Encounters:  08/14/21 118/69  07/10/21 109/70  02/21/21 123/78   Lab Results  Component Value Date   CREATININE 0.52 (L) 02/19/2020   Rash Eczema, chest, intermittent She has used triamcinolone in the past with improvement No drainage or signs of infestation  Vitamin D deficiency Hx of Some mild fatigue, wants to recheck  Past Medical History:  Diagnosis Date   Abnormal Pap smear    many yrs ago, 2020 LGSIL   Asthma    Depression    Fibroids    Hypertension    STD (sexually transmitted disease)    HSV2   Vaginal wall cyst     Past Surgical History:  Procedure Laterality Date   CHOLECYSTECTOMY N/A 05/11/2014   Procedure: LAPAROSCOPIC CHOLECYSTECTOMY WITH INTRAOPERATIVE CHOLANGIOGRAM;  Surgeon: Zenovia Jarred, MD;  Location: [facility];  Service: General;  Laterality: N/A;   CHOLECYSTECTOMY  04/2014   COLPOSCOPY     CRYOTHERAPY     for abnormal pap   DILATION AND CURETTAGE OF UTERUS     LAPAROSCOPIC APPENDECTOMY N/A 11/05/2017   Procedure: APPENDECTOMY LAPAROSCOPIC;  Surgeon: Coralie Keens, MD;  Location: [facility];  Service: General;   Laterality: N/A;    Family History  Problem Relation Age of Onset   Diabetes Mother    Cancer Mother        leukemia   Lupus Mother    Hypertension Mother    Hypertension Father    Breast cancer Maternal Aunt     Social History   Socioeconomic History   Marital status: Married    Spouse name: Not on file   Number of children: Not on file   Years of education: Not on file   Highest education level: Not on file  Occupational History   Not on file  Tobacco Use   Smoking status: Never   Smokeless tobacco: Never  Vaping Use   Vaping Use: Never used  Substance and Sexual Activity   Alcohol use: Yes    Comment: 1 a day   Drug use: No   Sexual activity: Yes    Partners: Male    Birth control/protection: Pill  Other Topics Concern   Not on file  Social History Narrative   Not on file   Social Determinants of Health   Financial Resource Strain: Not on file  Food Insecurity: Not on file  Transportation Needs: Not on file  Physical Activity: Not on file  Stress: Not on file  Social Connections: Not on file  Intimate Partner Violence: Not on file    Outpatient Medications Prior to Visit  Medication Sig Dispense Refill   albuterol (  VENTOLIN HFA) 108 (90 Base) MCG/ACT inhaler Inhale 2 puffs into the lungs every 6 (six) hours as needed for wheezing or shortness of breath. 18 g 5   azelastine (ASTELIN) 0.1 % nasal spray Place 1 spray into both nostrils 2 (two) times daily. Use in each nostril as directed 30 mL 12   cetirizine (ZYRTEC) 10 MG tablet Take 1 tablet (10 mg total) by mouth daily. 30 tablet 11   citalopram (CELEXA) 10 MG tablet TAKE 1 TABLET(10 MG) BY MOUTH DAILY 90 tablet 3   cyclobenzaprine (FLEXERIL) 5 MG tablet Take 1 tablet (5 mg total) by mouth 3 (three) times daily as needed for muscle spasms. 30 tablet 1   fluticasone (FLONASE) 50 MCG/ACT nasal spray Place 2 sprays into both nostrils daily. 16 g 6   guaiFENesin-dextromethorphan (ROBITUSSIN DM) 100-10  MG/5ML syrup Take 5 mLs by mouth every 4 (four) hours as needed for cough. 118 mL 0   ibuprofen (ADVIL,MOTRIN) 200 MG tablet Take 200 mg by mouth as needed.     Multiple Vitamins-Minerals (MULTIVITAMIN PO) Take 1 tablet by mouth daily as needed (supplemental).      norethindrone (MICRONOR) 0.35 MG tablet Take 1 tablet (0.35 mg total) by mouth daily. 84 tablet 4   predniSONE (DELTASONE) 20 MG tablet Take 1 tablet (20 mg total) by mouth daily with breakfast. 5 tablet 0   sulfamethoxazole-trimethoprim (BACTRIM DS) 800-160 MG tablet Take 1 tablet by mouth 2 (two) times daily. One PO BID x 3 days 6 tablet 0   valACYclovir (VALTREX) 1000 MG tablet Take one daily at onset of outbreak 45 tablet 12   amLODipine (NORVASC) 5 MG tablet Take 1 tablet (5 mg total) by mouth daily. 90 tablet 0   propranolol ER (INDERAL LA) 60 MG 24 hr capsule Take 1 capsule (60 mg total) by mouth daily. For headaches 30 capsule 0   No facility-administered medications prior to visit.    Allergies  Allergen Reactions   Flagyl [Metronidazole] Hives    Itchy all over    ROS Review of Systems  Constitutional: Negative.   HENT: Negative.    Eyes: Negative.   Respiratory: Negative.    Cardiovascular: Negative.   Gastrointestinal: Negative.   Genitourinary: Negative.   Musculoskeletal: Negative.   Skin: Negative.   Neurological: Negative.   Psychiatric/Behavioral: Negative.    All other systems reviewed and are negative.    Objective:    Physical Exam Vitals and nursing note reviewed.  Constitutional:      General: She is not in acute distress.    Appearance: Normal appearance. She is normal weight. She is not ill-appearing, toxic-appearing or diaphoretic.  Cardiovascular:     Rate and Rhythm: Normal rate and regular rhythm.     Heart sounds: Normal heart sounds. No murmur heard.   No friction rub. No gallop.  Pulmonary:     Effort: Pulmonary effort is normal. No respiratory distress.     Breath sounds:  Normal breath sounds. No stridor. No wheezing, rhonchi or rales.  Chest:     Chest wall: No tenderness.  Skin:    General: Skin is warm and dry.  Neurological:     General: No focal deficit present.     Mental Status: She is alert and oriented to person, place, and time. Mental status is at baseline.  Psychiatric:        Mood and Affect: Mood normal.        Behavior: Behavior normal.  Thought Content: Thought content normal.        Judgment: Judgment normal.    BP 118/69   Pulse 81   Temp 98.2 F (36.8 C) (Temporal)   Resp 17   Ht 5\' 2"  (1.575 m)   Wt 184 lb 9.6 oz (83.7 kg)   BMI 33.76 kg/m  Wt Readings from Last 3 Encounters:  08/14/21 184 lb 9.6 oz (83.7 kg)  04/29/21 180 lb (81.6 kg)  01/16/21 189 lb (85.7 kg)     There are no preventive care reminders to display for this patient.  There are no preventive care reminders to display for this patient.  Lab Results  Component Value Date   TSH 1.670 02/19/2020   Lab Results  Component Value Date   WBC 7.7 02/19/2020   HGB 12.7 02/19/2020   HCT 39.9 02/19/2020   MCV 89.0 02/19/2020   PLT 415 (H) 11/10/2017   Lab Results  Component Value Date   NA 139 02/19/2020   K 4.1 02/19/2020   CO2 26 02/19/2020   GLUCOSE 87 02/19/2020   BUN 8 02/19/2020   CREATININE 0.52 (L) 02/19/2020   BILITOT 0.6 02/19/2020   ALKPHOS 73 02/19/2020   AST 19 02/19/2020   ALT 11 02/19/2020   PROT 6.5 02/19/2020   ALBUMIN 4.0 02/19/2020   CALCIUM 8.9 02/19/2020   ANIONGAP 13 11/10/2017   Lab Results  Component Value Date   CHOL 183 02/28/2019   Lab Results  Component Value Date   HDL 73 02/28/2019   Lab Results  Component Value Date   LDLCALC 99 02/28/2019   Lab Results  Component Value Date   TRIG 55 02/28/2019   Lab Results  Component Value Date   CHOLHDL 2.5 02/28/2019   Lab Results  Component Value Date   HGBA1C 5.3 02/19/2020      Assessment & Plan:   Problem List Items Addressed This Visit        Cardiovascular and Mediastinum   Essential hypertension   Relevant Medications   propranolol ER (INDERAL LA) 60 MG 24 hr capsule   amLODipine (NORVASC) 5 MG tablet   Other Relevant Orders   CBC with Differential/Platelet   Hemoglobin A1c   Comprehensive metabolic panel   Lipid panel   TSH   Other Visit Diagnoses     Screening for endocrine, metabolic and immunity disorder    -  Primary   Relevant Orders   Hemoglobin A1c   TSH   Frontal headache       Relevant Medications   propranolol ER (INDERAL LA) 60 MG 24 hr capsule   amLODipine (NORVASC) 5 MG tablet   diclofenac (VOLTAREN) 75 MG EC tablet   Lipid screening       Relevant Orders   Lipid panel   Vitamin D deficiency       Relevant Orders   Vitamin D (25 hydroxy)   Eczema, unspecified type       Relevant Medications   triamcinolone cream (KENALOG) 0.1 %   Chronic pain of left knee       Relevant Medications   diclofenac (VOLTAREN) 75 MG EC tablet   Other Relevant Orders   DG Knee Complete 4 Views Left       Meds ordered this encounter  Medications   propranolol ER (INDERAL LA) 60 MG 24 hr capsule    Sig: Take 1 capsule (60 mg total) by mouth daily. For headaches    Dispense:  30 capsule  Refill:  0    Order Specific Question:   Supervising Provider    Answer:   Carlota Raspberry, JEFFREY R [[id number]]   amLODipine (NORVASC) 5 MG tablet    Sig: Take 1 tablet (5 mg total) by mouth daily.    Dispense:  90 tablet    Refill:  0    Order Specific Question:   Supervising Provider    Answer:   Carlota Raspberry, JEFFREY R [[id number]]   triamcinolone cream (KENALOG) 0.1 %    Sig: Apply 1 application topically 2 (two) times daily.    Dispense:  30 g    Refill:  0    Order Specific Question:   Supervising Provider    Answer:   Carlota Raspberry, JEFFREY R [[id number]]   diclofenac (VOLTAREN) 75 MG EC tablet    Sig: Take 1 tablet (75 mg total) by mouth 2 (two) times daily.    Dispense:  30 tablet    Refill:  0    Order Specific Question:   Supervising  Provider    Answer:   Carlota Raspberry, JEFFREY R [[id number]]    Follow-up: Return in about 6 months (around 02/11/2022) for HTN.   PLAN Refill amlodipine Triamcinolone for eczema Labs collected. Will follow up with the patient as warranted. 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 Order DG knee left. Discussed nonpharm. Follow up as warranted. Follow up in 6 mo for htn Patient encouraged to call clinic with any questions, comments, or concerns.  Maximiano Coss, NP

## 2021-08-14 NOTE — Patient Instructions (Addendum)
[name] to see you! In brief:  Knee pain - seems like some arthritis. Will order Xray to [facility] at [address] They will call you to schedule, or you can give them a buzz at [phone]. Results will be sent to me. In the interim, try diclofenac 75mg  once or twice daily as needed. Supplement with tylenol if you need to. Heat and ice ok. Triamcinolone sent for eczema Labs today will be back tomorrow, I'll call with concerns Amlodipine refilled. See you in March!   Thanks,  Rich     If you have lab work done today you will be contacted with your lab results within the next 2 weeks.  If you have not heard from [place] then please contact us. The fastest way to get your results is to register for My Chart.   IF you received an x-ray today, you will receive an invoice from [facility]. Please contact [facility] at [phone] with questions or concerns regarding your invoice.   IF you received labwork today, you will receive an invoice from [facility]. Please contact [facility] at [phone] with questions or concerns regarding your invoice.   Our billing staff will not be able to assist you with questions regarding bills from these companies.  You will be contacted with the lab results as soon as they are available. The fastest way to get your results is to activate your My Chart account. Instructions are located on the last page of this paperwork. If you have not heard from [place] regarding the results in 2 weeks, please contact this office.

## 2021-08-15 LAB — VITAMIN D 25 HYDROXY (VIT D DEFICIENCY, FRACTURES): VITD: 17.19 ng/mL — ABNORMAL LOW (ref 30.00–100.00)

## 2021-08-15 LAB — COMPREHENSIVE METABOLIC PANEL
ALT: 10 U/L (ref 0–35)
AST: 16 U/L (ref 0–37)
Albumin: 4 g/dL (ref 3.5–5.2)
Alkaline Phosphatase: 59 U/L (ref 39–117)
BUN: 9 mg/dL (ref 6–23)
CO2: 28 mEq/L (ref 19–32)
Calcium: 8.9 mg/dL (ref 8.4–10.5)
Chloride: 103 mEq/L (ref 96–112)
Creatinine, Ser: 0.62 mg/dL (ref 0.40–1.20)
GFR: 105.48 mL/min (ref >60.00)
Glucose, Bld: 82 mg/dL (ref 70–99)
Potassium: 3.6 mEq/L (ref 3.5–5.1)
Sodium: 139 mEq/L (ref 135–145)
Total Bilirubin: 0.6 mg/dL (ref 0.2–1.2)
Total Protein: 6.9 g/dL (ref 6.0–8.3)

## 2021-08-15 LAB — CBC WITH DIFFERENTIAL/PLATELET
Basophils Absolute: 0.1 10*3/uL (ref 0.0–0.1)
Basophils Relative: 1 % (ref 0.0–3.0)
Eosinophils Absolute: 0.1 10*3/uL (ref 0.0–0.7)
Eosinophils Relative: 1.1 % (ref 0.0–5.0)
HCT: 37 % (ref 36.0–46.0)
Hemoglobin: 12.6 g/dL (ref 12.0–15.0)
Lymphocytes Relative: 33.8 % (ref 12.0–46.0)
Lymphs Abs: 2.1 10*3/uL (ref 0.7–4.0)
MCHC: 34 g/dL (ref 30.0–36.0)
MCV: 84 fl (ref 78.0–100.0)
Monocytes Absolute: 0.6 10*3/uL (ref 0.1–1.0)
Monocytes Relative: 9.5 % (ref 3.0–12.0)
Neutro Abs: 3.5 10*3/uL (ref 1.4–7.7)
Neutrophils Relative %: 54.6 % (ref 43.0–77.0)
Platelets: 325 10*3/uL (ref 150.0–400.0)
RBC: 4.41 Mil/uL (ref 3.87–5.11)
RDW: 13.5 % (ref 11.5–15.5)
WBC: 6.3 10*3/uL (ref 4.0–10.5)

## 2021-08-15 LAB — LIPID PANEL
Cholesterol: 195 mg/dL (ref 0–200)
HDL: 75.4 mg/dL (ref >39.00)
LDL Cholesterol: 108 mg/dL — ABNORMAL HIGH (ref 0–99)
NonHDL: 119.68
Total CHOL/HDL Ratio: 3
Triglycerides: 56 mg/dL (ref 0.0–149.0)
VLDL: 11.2 mg/dL (ref 0.0–40.0)

## 2021-08-15 LAB — TSH: TSH: 1.4 u[IU]/mL (ref 0.35–5.50)

## 2021-08-15 LAB — HEMOGLOBIN A1C: Hgb A1c MFr Bld: 5.4 % (ref 4.6–6.5)

## 2021-08-17 ENCOUNTER — Encounter: Payer: Self-pay | Admitting: Registered Nurse

## 2021-08-18 ENCOUNTER — Other Ambulatory Visit: Payer: Self-pay | Admitting: Registered Nurse

## 2021-08-18 DIAGNOSIS — E559 Vitamin D deficiency, unspecified: Secondary | ICD-10-CM

## 2021-08-18 MED ORDER — VITAMIN D (ERGOCALCIFEROL) 1.25 MG (50000 UNIT) PO CAPS
50000.0000 [IU] | ORAL_CAPSULE | ORAL | 0 refills | Status: DC
Start: 2021-08-18 — End: 2021-12-19

## 2021-08-26 ENCOUNTER — Ambulatory Visit
Admission: RE | Admit: 2021-08-26 | Discharge: 2021-08-26 | Disposition: A | Discharge status: Home / Self Care | Payer: PRIVATE HEALTH INSURANCE | Source: Ambulatory Visit | Attending: Registered Nurse | Admitting: Registered Nurse

## 2021-08-26 ENCOUNTER — Other Ambulatory Visit: Payer: Self-pay

## 2021-08-26 DIAGNOSIS — G8929 Other chronic pain: Secondary | ICD-10-CM

## 2021-08-26 DIAGNOSIS — M25562 Pain in left knee: Secondary | ICD-10-CM

## 2021-08-28 ENCOUNTER — Encounter: Payer: Self-pay | Admitting: Registered Nurse

## 2021-09-09 ENCOUNTER — Ambulatory Visit: Payer: PRIVATE HEALTH INSURANCE | Admitting: Obstetrics and Gynecology

## 2021-09-11 ENCOUNTER — Other Ambulatory Visit: Payer: Self-pay | Admitting: Registered Nurse

## 2021-09-11 ENCOUNTER — Ambulatory Visit: Payer: PRIVATE HEALTH INSURANCE | Admitting: Obstetrics and Gynecology

## 2021-09-11 DIAGNOSIS — J3489 Other specified disorders of nose and nasal sinuses: Secondary | ICD-10-CM

## 2021-09-11 DIAGNOSIS — A6009 Herpesviral infection of other urogenital tract: Secondary | ICD-10-CM

## 2021-09-11 DIAGNOSIS — J329 Chronic sinusitis, unspecified: Secondary | ICD-10-CM

## 2021-10-02 ENCOUNTER — Ambulatory Visit (INDEPENDENT_AMBULATORY_CARE_PROVIDER_SITE_OTHER): Payer: PRIVATE HEALTH INSURANCE | Admitting: Obstetrics and Gynecology

## 2021-10-02 ENCOUNTER — Encounter: Payer: Self-pay | Admitting: Obstetrics and Gynecology

## 2021-10-02 ENCOUNTER — Other Ambulatory Visit: Payer: Self-pay

## 2021-10-02 DIAGNOSIS — M62838 Other muscle spasm: Secondary | ICD-10-CM | POA: Diagnosis not present

## 2021-10-02 MED ORDER — CYCLOBENZAPRINE HCL 5 MG PO TABS: 5.0000 mg | ORAL_TABLET | Freq: Three times a day (TID) | ORAL | 11 refills | Status: DC | PRN

## 2021-10-02 NOTE — Progress Notes (Signed)
[facility] Return Visit  SUBJECTIVE  History of Present Illness: Beth Frost is a 50 y.o. female seen in follow-up for dyspareunia, levator spasm.  She is taking the flexeril 1-2 times per week and before intercourse and has very little pain. She tried the full 5mg  tablet but it caused her to be drowsy so she has been cutting the pill in half- no longer experiences drowsiness.   Has not tried a pelvic wand or manual massage.   Past Medical History: Patient  has a past medical history of Abnormal Pap smear, Asthma, Depression, Fibroids, Hypertension, STD (sexually transmitted disease), and Vaginal wall cyst.   Past Surgical History: She  has a past surgical history that includes Colposcopy; Cryotherapy; Dilation and curettage of uterus; Cholecystectomy (N/A, 05/11/2014); Cholecystectomy (04/2014); and laparoscopic appendectomy (N/A, 11/05/2017).   Medications: She has a current medication list which includes the following prescription(s): albuterol, amlodipine, azelastine, cetirizine, citalopram, cyclobenzaprine, diclofenac, fluticasone, guaifenesin-dextromethorphan, ibuprofen, multiple vitamin, norethindrone, prednisone, propranolol er, sulfamethoxazole-trimethoprim, triamcinolone cream, valacyclovir, and vitamin d (ergocalciferol).   Allergies: Patient is allergic to flagyl [metronidazole].   Social History: Patient  reports that she has never smoked. She has never used smokeless tobacco. She reports current alcohol use. She reports that she does not use drugs.      OBJECTIVE     Physical Exam: Vitals:   10/02/21 1548  BP: 134/82  Pulse: 80  Weight: 184 lb (83.5 kg)  Height: 5\' 2"  (1.575 m)    Gen: No apparent distress, A&O x 3.  Detailed Urogynecologic Evaluation:  Deferred.    ASSESSMENT AND PLAN    Beth Frost is a 50 y.o. with:  1. Levator spasm    - Continue with a half pill of flexeril 5mg  as needed to prevent pain from levator spasm prior to  intercourse. Refill provided.  - We again reviewed that she can do manual pelvic floor massage if needed.   Return 6 months to evaluate progress  [name], MD  Time spent: I spent 15 minutes dedicated to the care of this patient on the date of this encounter to include pre-visit review of records, face-to-face time with the patient and post visit documentation and ordering medication/ testing.

## 2021-10-06 ENCOUNTER — Other Ambulatory Visit: Payer: Self-pay

## 2021-10-06 ENCOUNTER — Telehealth: Payer: Self-pay

## 2021-10-06 DIAGNOSIS — F4323 Adjustment disorder with mixed anxiety and depressed mood: Secondary | ICD-10-CM

## 2021-10-06 MED ORDER — CITALOPRAM HYDROBROMIDE 10 MG PO TABS: ORAL_TABLET | ORAL | 3 refills | Status: DC

## 2021-10-06 NOTE — Telephone Encounter (Signed)
Caller name:Naleigha Devinn Voshell callback [phone]  Encourage patient to contact the pharmacy for refills or they can request refills through [facility]  (Please schedule appointment if patient has not been seen in over a year)  MEDICATION NAME & DOSE:citalopram (CELEXA) 10 MG tablet   Notes/Comments from patient:  [place] TO: [facility] [address], [address] AT [place]   Please notify patient: It takes 48-72 hours to process rx refill requests Ask patient to call pharmacy to ensure rx is ready before heading there.   (CLINICAL TO FILL OR ROUTE PER PROTOCOLS)

## 2021-10-06 NOTE — Telephone Encounter (Signed)
Celexa 10 mg has been filled and sent to patient pharmacy

## 2021-11-28 ENCOUNTER — Encounter (HOSPITAL_BASED_OUTPATIENT_CLINIC_OR_DEPARTMENT_OTHER): Payer: Self-pay | Admitting: *Deleted

## 2021-12-18 ENCOUNTER — Ambulatory Visit: Payer: PRIVATE HEALTH INSURANCE | Admitting: Registered Nurse

## 2021-12-18 ENCOUNTER — Encounter: Payer: Self-pay | Admitting: Registered Nurse

## 2021-12-18 VITALS — BP 120/68 | HR 80 | Temp 98.3°F | Resp 18

## 2021-12-18 DIAGNOSIS — R0681 Apnea, not elsewhere classified: Secondary | ICD-10-CM

## 2021-12-18 DIAGNOSIS — E559 Vitamin D deficiency, unspecified: Secondary | ICD-10-CM

## 2021-12-18 DIAGNOSIS — F4323 Adjustment disorder with mixed anxiety and depressed mood: Secondary | ICD-10-CM

## 2021-12-18 MED ORDER — CITALOPRAM HYDROBROMIDE 20 MG PO TABS: 20.0000 mg | ORAL_TABLET | Freq: Every day | ORAL | 3 refills | Status: DC

## 2021-12-18 MED ORDER — HYDROXYZINE HCL 10 MG PO TABS: 5.0000 mg | ORAL_TABLET | Freq: Three times a day (TID) | ORAL | 0 refills | Status: DC | PRN

## 2021-12-18 NOTE — Patient Instructions (Signed)
[name] to see you!  Let's increase celexa to 20mg  daily I have referred to counseling Use hydroxyzine as needed before bed for sleep.  I have referred to sleep med at pulmonary  Thank you  Denice Paradise

## 2021-12-18 NOTE — Progress Notes (Signed)
Established Patient Office Visit  Subjective:  Patient ID: Beth Frost, female    DOB: June 21, 1971  Age: 51 y.o. MRN: [id number]  CC:  Chief Complaint  Patient presents with   Depression    HPI Beth Frost presents for depression  Worsening On celexa 10mg  po qd Good effect previously  Crying more often now 2-4 glasses of wine nightly to help with worries.  No hi/si  Interested in counseling   Also notes snoring Ongoing. Husband suggested sleep study No famhx of OSA to her knowledge No famhx of early cardiac disease to her knowledge   Would like to recheck vitamin D No acute concerns for symptoms.   Past Medical History:  Diagnosis Date   Abnormal Pap smear    many yrs ago, 2020 LGSIL   Asthma    Depression    Fibroids    Hypertension    STD (sexually transmitted disease)    HSV2   Vaginal wall cyst     Past Surgical History:  Procedure Laterality Date   CHOLECYSTECTOMY N/A 05/11/2014   Procedure: LAPAROSCOPIC CHOLECYSTECTOMY WITH INTRAOPERATIVE CHOLANGIOGRAM;  Surgeon: Zenovia Jarred, MD;  Location: [facility];  Service: General;  Laterality: N/A;   CHOLECYSTECTOMY  04/2014   COLPOSCOPY     CRYOTHERAPY     for abnormal pap   DILATION AND CURETTAGE OF UTERUS     LAPAROSCOPIC APPENDECTOMY N/A 11/05/2017   Procedure: APPENDECTOMY LAPAROSCOPIC;  Surgeon: Coralie Keens, MD;  Location: [facility];  Service: General;  Laterality: N/A;    Family History  Problem Relation Age of Onset   Diabetes Mother    Cancer Mother        leukemia   Lupus Mother    Hypertension Mother    Hypertension Father    Breast cancer Maternal Aunt     Social History   Socioeconomic History   Marital status: Married    Spouse name: Not on file   Number of children: Not on file   Years of education: Not on file   Highest education level: Not on file  Occupational History   Not on file  Tobacco Use   Smoking status: Never   Smokeless tobacco: Never  Vaping Use    Vaping Use: Never used  Substance and Sexual Activity   Alcohol use: Yes    Comment: 1 a day   Drug use: No   Sexual activity: Yes    Partners: Male    Birth control/protection: Pill  Other Topics Concern   Not on file  Social History Narrative   Not on file   Social Determinants of Health   Financial Resource Strain: Not on file  Food Insecurity: Not on file  Transportation Needs: Not on file  Physical Activity: Not on file  Stress: Not on file  Social Connections: Not on file  Intimate Partner Violence: Not on file    Outpatient Medications Prior to Visit  Medication Sig Dispense Refill   albuterol (VENTOLIN HFA) 108 (90 Base) MCG/ACT inhaler Inhale 2 puffs into the lungs every 6 (six) hours as needed for wheezing or shortness of breath. 18 g 5   amLODipine (NORVASC) 5 MG tablet Take 1 tablet (5 mg total) by mouth daily. 90 tablet 0   azelastine (ASTELIN) 0.1 % nasal spray Place 1 spray into both nostrils 2 (two) times daily. Use in each nostril as directed 30 mL 12   cetirizine (ZYRTEC) 10 MG tablet TAKE 1 TABLET(10 MG) BY MOUTH  DAILY 30 tablet 11   cyclobenzaprine (FLEXERIL) 5 MG tablet Take 1 tablet (5 mg total) by mouth 3 (three) times daily as needed for muscle spasms. 30 tablet 11   diclofenac (VOLTAREN) 75 MG EC tablet Take 1 tablet (75 mg total) by mouth 2 (two) times daily. 30 tablet 0   fluticasone (FLONASE) 50 MCG/ACT nasal spray Place 2 sprays into both nostrils daily. 16 g 6   guaiFENesin-dextromethorphan (ROBITUSSIN DM) 100-10 MG/5ML syrup Take 5 mLs by mouth every 4 (four) hours as needed for cough. 118 mL 0   ibuprofen (ADVIL,MOTRIN) 200 MG tablet Take 200 mg by mouth as needed.     Multiple Vitamins-Minerals (MULTIVITAMIN PO) Take 1 tablet by mouth daily as needed (supplemental).      norethindrone (MICRONOR) 0.35 MG tablet Take 1 tablet (0.35 mg total) by mouth daily. 84 tablet 4   propranolol ER (INDERAL LA) 60 MG 24 hr capsule Take 1 capsule (60 mg total)  by mouth daily. For headaches 30 capsule 0   triamcinolone cream (KENALOG) 0.1 % Apply 1 application topically 2 (two) times daily. 30 g 0   valACYclovir (VALTREX) 1000 MG tablet TAKE 1 TABLET BY MOUTH DAILY AT ONSET OF OUTBREAK 45 tablet 12   Vitamin D, Ergocalciferol, (DRISDOL) 1.25 MG (50000 UNIT) CAPS capsule Take 1 capsule (50,000 Units total) by mouth every 7 (seven) days. 12 capsule 0   citalopram (CELEXA) 10 MG tablet TAKE 1 TABLET(10 MG) BY MOUTH DAILY 90 tablet 3   predniSONE (DELTASONE) 20 MG tablet Take 1 tablet (20 mg total) by mouth daily with breakfast. 5 tablet 0   sulfamethoxazole-trimethoprim (BACTRIM DS) 800-160 MG tablet Take 1 tablet by mouth 2 (two) times daily. One PO BID x 3 days 6 tablet 0   No facility-administered medications prior to visit.    Allergies  Allergen Reactions   Flagyl [Metronidazole] Hives    Itchy all over    ROS Review of Systems Per hpi    Objective:    Physical Exam Vitals and nursing note reviewed.  Constitutional:      General: She is not in acute distress.    Appearance: Normal appearance. She is normal weight. She is not ill-appearing, toxic-appearing or diaphoretic.  Cardiovascular:     Rate and Rhythm: Normal rate and regular rhythm.     Heart sounds: Normal heart sounds. No murmur heard.   No friction rub. No gallop.  Pulmonary:     Effort: Pulmonary effort is normal. No respiratory distress.     Breath sounds: Normal breath sounds. No stridor. No wheezing, rhonchi or rales.  Chest:     Chest wall: No tenderness.  Skin:    General: Skin is warm and dry.  Neurological:     General: No focal deficit present.     Mental Status: She is alert and oriented to person, place, and time. Mental status is at baseline.  Psychiatric:        Mood and Affect: Mood normal.        Behavior: Behavior normal.        Thought Content: Thought content normal.        Judgment: Judgment normal.    BP 120/68    Pulse 80    Temp 98.3 F  (36.8 C) (Temporal)    Resp 18    LMP 12/04/2021 (Approximate)    SpO2 100%  Wt Readings from Last 3 Encounters:  10/02/21 184 lb (83.5 kg)  08/14/21 184 lb 9.6  oz (83.7 kg)  04/29/21 180 lb (81.6 kg)     Health Maintenance Due  Topic Date Due   Hepatitis C Screening  Never done   COVID-19 Vaccine (3 - Booster for Moderna series) 03/19/2020    There are no preventive care reminders to display for this patient.  Lab Results  Component Value Date   TSH 1.40 08/14/2021   Lab Results  Component Value Date   WBC 6.3 08/14/2021   HGB 12.6 08/14/2021   HCT 37.0 08/14/2021   MCV 84.0 08/14/2021   PLT 325.0 08/14/2021   Lab Results  Component Value Date   NA 139 08/14/2021   K 3.6 08/14/2021   CO2 28 08/14/2021   GLUCOSE 82 08/14/2021   BUN 9 08/14/2021   CREATININE 0.62 08/14/2021   BILITOT 0.6 08/14/2021   ALKPHOS 59 08/14/2021   AST 16 08/14/2021   ALT 10 08/14/2021   PROT 6.9 08/14/2021   ALBUMIN 4.0 08/14/2021   CALCIUM 8.9 08/14/2021   ANIONGAP 13 11/10/2017   GFR 105.48 08/14/2021   Lab Results  Component Value Date   CHOL 195 08/14/2021   Lab Results  Component Value Date   HDL 75.40 08/14/2021   Lab Results  Component Value Date   LDLCALC 108 (H) 08/14/2021   Lab Results  Component Value Date   TRIG 56.0 08/14/2021   Lab Results  Component Value Date   CHOLHDL 3 08/14/2021   Lab Results  Component Value Date   HGBA1C 5.4 08/14/2021      Assessment & Plan:   Problem List Items Addressed This Visit   None Visit Diagnoses     Vitamin D deficiency    -  Primary   Relevant Orders   Vitamin D (25 hydroxy)   Adjustment disorder with mixed anxiety and depressed mood       Relevant Medications   citalopram (CELEXA) 20 MG tablet   hydrOXYzine (ATARAX) 10 MG tablet   Other Relevant Orders   Ambulatory referral to Psychology   Witnessed episode of apnea       Relevant Orders   Ambulatory referral to Pulmonology       Meds ordered this  encounter  Medications   citalopram (CELEXA) 20 MG tablet    Sig: Take 1 tablet (20 mg total) by mouth daily.    Dispense:  30 tablet    Refill:  3    Order Specific Question:   Supervising Provider    Answer:   Carlota Raspberry, JEFFREY R [[id number]]   hydrOXYzine (ATARAX) 10 MG tablet    Sig: Take 0.5-1 tablets (5-10 mg total) by mouth 3 (three) times daily as needed.    Dispense:  30 tablet    Refill:  0    Order Specific Question:   Supervising Provider    Answer:   Carlota Raspberry, JEFFREY R [[id number]]    Follow-up: Return in about 8 weeks (around 02/12/2022) for follow up 8 weeks.   PLAN Increase citalopram to 20mg  po qd Refer to counseling.  Suggest limiting alcohol use as it is likely worsening depression and anxiety Refer to pulmonary for sleep study. Vitamin d collected Patient encouraged to call clinic with any questions, comments, or concerns.  Maximiano Coss, NP

## 2021-12-19 ENCOUNTER — Other Ambulatory Visit: Payer: Self-pay | Admitting: Registered Nurse

## 2021-12-19 DIAGNOSIS — E559 Vitamin D deficiency, unspecified: Secondary | ICD-10-CM

## 2021-12-19 LAB — VITAMIN D 25 HYDROXY (VIT D DEFICIENCY, FRACTURES): VITD: 23.9 ng/mL — ABNORMAL LOW (ref 30.00–100.00)

## 2021-12-19 MED ORDER — VITAMIN D (ERGOCALCIFEROL) 1.25 MG (50000 UNIT) PO CAPS: 50000.0000 [IU] | ORAL_CAPSULE | ORAL | 1 refills | Status: DC

## 2022-01-11 ENCOUNTER — Encounter: Payer: Self-pay | Admitting: Registered Nurse

## 2022-01-12 ENCOUNTER — Telehealth (INDEPENDENT_AMBULATORY_CARE_PROVIDER_SITE_OTHER): Payer: PRIVATE HEALTH INSURANCE | Admitting: Registered Nurse

## 2022-01-12 ENCOUNTER — Other Ambulatory Visit: Payer: Self-pay

## 2022-01-12 ENCOUNTER — Encounter: Payer: Self-pay | Admitting: Registered Nurse

## 2022-01-12 VITALS — Wt 195.0 lb

## 2022-01-12 DIAGNOSIS — I10 Essential (primary) hypertension: Secondary | ICD-10-CM

## 2022-01-12 DIAGNOSIS — E669 Obesity, unspecified: Secondary | ICD-10-CM | POA: Diagnosis not present

## 2022-01-12 DIAGNOSIS — R519 Headache, unspecified: Secondary | ICD-10-CM | POA: Diagnosis not present

## 2022-01-12 DIAGNOSIS — F4323 Adjustment disorder with mixed anxiety and depressed mood: Secondary | ICD-10-CM | POA: Diagnosis not present

## 2022-01-12 MED ORDER — AMLODIPINE BESYLATE 5 MG PO TABS: 5.0000 mg | ORAL_TABLET | Freq: Every day | ORAL | 3 refills | Status: DC

## 2022-01-12 MED ORDER — PROPRANOLOL HCL ER 60 MG PO CP24: 60.0000 mg | ORAL_CAPSULE | Freq: Every day | ORAL | 0 refills | Status: DC

## 2022-01-12 MED ORDER — CITALOPRAM HYDROBROMIDE 20 MG PO TABS: 20.0000 mg | ORAL_TABLET | Freq: Every day | ORAL | 3 refills | Status: DC

## 2022-01-12 MED ORDER — SEMAGLUTIDE(0.25 OR 0.5MG/DOS) 2 MG/1.5ML ~~LOC~~ SOPN: 0.5000 mg | PEN_INJECTOR | SUBCUTANEOUS | 1 refills | Status: DC

## 2022-01-12 NOTE — Progress Notes (Signed)
Telemedicine Encounter- SOAP NOTE Established Patient  This telephone encounter was conducted with the patient's (or proxy's) verbal consent via audio telecommunications: yes/no: Yes Patient was instructed to have this encounter in a suitably private space; and to only have persons present to whom they give permission to participate. In addition, patient identity was confirmed by use of name plus two identifiers (DOB and address).  I discussed the limitations, risks, security and privacy concerns of performing an evaluation and management service by telephone and the availability of in person appointments. I also discussed with the patient that there may be a patient responsible charge related to this service. The patient expressed understanding and agreed to proceed.  I spent a total of 14 minutes talking with the patient or their proxy.  Patient at home Provider in office  Participants: Beth Ruddy, NP and Beth Frost  Chief Complaint  Patient presents with   Medication Refill    Patient states she needs a medication refill on a few medications. Patient would also like to discuss some weight that she cant seem to lose even when making better eating choices and exercising.     Subjective   Beth Frost is a 51 y.o. established patient. Telephone visit today for med refill, weight management.   HPI Hypertension: Patient Currently taking: amlodipine 5mg  po qd Good effect. No AEs. Denies CV symptoms including: chest pain, shob, doe, headache, visual changes, fatigue, claudication, and dependent edema.   Previous readings and labs: BP Readings from Last 3 Encounters:  12/18/21 120/68  10/02/21 134/82  08/14/21 118/69   Lab Results  Component Value Date   CREATININE 0.62 08/14/2021   Depression/anxiety Celexa 20mg  po qd Good effect, no AE Wants to continue  Weight Management Interested in medication options Has been on phentermine in past with good effect, no  AE Interested in [facility] [place], has not tried, unsure of coverage. Last a1c 5.4   Patient Active Problem List   Diagnosis Date Noted   Fever, unspecified 06/01/2019   Malaise 06/01/2019   Asthma 05/13/2019   Hypertensive disorder 05/13/2019   Acute appendicitis 11/04/2017   Essential hypertension 11/26/2016    Class: Family History of   Herpes genitalis in women 07/18/2014    Class: History of    Past Medical History:  Diagnosis Date   Abnormal Pap smear    many yrs ago, 2020 LGSIL   Asthma    Depression    Fibroids    Hypertension    STD (sexually transmitted disease)    HSV2   Vaginal wall cyst     Current Outpatient Medications  Medication Sig Dispense Refill   albuterol (VENTOLIN HFA) 108 (90 Base) MCG/ACT inhaler Inhale 2 puffs into the lungs every 6 (six) hours as needed for wheezing or shortness of breath. 18 g 5   azelastine (ASTELIN) 0.1 % nasal spray Place 1 spray into both nostrils 2 (two) times daily. Use in each nostril as directed 30 mL 12   cetirizine (ZYRTEC) 10 MG tablet TAKE 1 TABLET(10 MG) BY MOUTH DAILY 30 tablet 11   cyclobenzaprine (FLEXERIL) 5 MG tablet Take 1 tablet (5 mg total) by mouth 3 (three) times daily as needed for muscle spasms. 30 tablet 11   diclofenac (VOLTAREN) 75 MG EC tablet Take 1 tablet (75 mg total) by mouth 2 (two) times daily. 30 tablet 0   fluticasone (FLONASE) 50 MCG/ACT nasal spray Place 2 sprays into both nostrils daily. 16 g 6  guaiFENesin-dextromethorphan (ROBITUSSIN DM) 100-10 MG/5ML syrup Take 5 mLs by mouth every 4 (four) hours as needed for cough. 118 mL 0   hydrOXYzine (ATARAX) 10 MG tablet Take 0.5-1 tablets (5-10 mg total) by mouth 3 (three) times daily as needed. 30 tablet 0   ibuprofen (ADVIL,MOTRIN) 200 MG tablet Take 200 mg by mouth as needed.     Multiple Vitamins-Minerals (MULTIVITAMIN PO) Take 1 tablet by mouth daily as needed (supplemental).      norethindrone (MICRONOR) 0.35 MG tablet Take 1 tablet (0.35 mg total)  by mouth daily. 84 tablet 4   Semaglutide,0.25 or 0.5MG /DOS, 2 MG/1.5ML SOPN Inject 0.5 mg into the skin once a week. 4.5 mL 1   triamcinolone cream (KENALOG) 0.1 % Apply 1 application topically 2 (two) times daily. 30 g 0   valACYclovir (VALTREX) 1000 MG tablet TAKE 1 TABLET BY MOUTH DAILY AT ONSET OF OUTBREAK 45 tablet 12   Vitamin D, Ergocalciferol, (DRISDOL) 1.25 MG (50000 UNIT) CAPS capsule Take 1 capsule (50,000 Units total) by mouth every 7 (seven) days. 12 capsule 1   amLODipine (NORVASC) 5 MG tablet Take 1 tablet (5 mg total) by mouth daily. 90 tablet 3   citalopram (CELEXA) 20 MG tablet Take 1 tablet (20 mg total) by mouth daily. 90 tablet 3   propranolol ER (INDERAL LA) 60 MG 24 hr capsule Take 1 capsule (60 mg total) by mouth daily. For headaches 30 capsule 0   No current facility-administered medications for this visit.    Allergies  Allergen Reactions   Flagyl [Metronidazole] Hives    Itchy all over    Social History   Socioeconomic History   Marital status: Married    Spouse name: Not on file   Number of children: Not on file   Years of education: Not on file   Highest education level: Not on file  Occupational History   Not on file  Tobacco Use   Smoking status: Never   Smokeless tobacco: Never  Vaping Use   Vaping Use: Never used  Substance and Sexual Activity   Alcohol use: Yes    Comment: 1 a day   Drug use: No   Sexual activity: Yes    Partners: Male    Birth control/protection: Pill  Other Topics Concern   Not on file  Social History Narrative   Not on file   Social Determinants of Health   Financial Resource Strain: Not on file  Food Insecurity: Not on file  Transportation Needs: Not on file  Physical Activity: Not on file  Stress: Not on file  Social Connections: Not on file  Intimate Partner Violence: Not on file    ROS Per hpi   Objective   Vitals as reported by the patient: Today's Vitals   01/12/22 1036  Weight: 195 lb (88.5  kg)    [name] was seen today for medication refill.  Diagnoses and all orders for this visit:  Obesity, Class II, BMI 35-39.9 -     Semaglutide,0.25 or 0.5MG /DOS, 2 MG/1.5ML SOPN; Inject 0.5 mg into the skin once a week.  Frontal headache -     propranolol ER (INDERAL LA) 60 MG 24 hr capsule; Take 1 capsule (60 mg total) by mouth daily. For headaches  Essential hypertension -     amLODipine (NORVASC) 5 MG tablet; Take 1 tablet (5 mg total) by mouth daily.  Adjustment disorder with mixed anxiety and depressed mood -     citalopram (CELEXA) 20 MG tablet;  Take 1 tablet (20 mg total) by mouth daily.    PLAN Refill meds as above Start semaglutide 0.5mg  subq weekly. Reviewed risks, benefits, and alternatives If not covered, will plan to restart phentermine 37.5mg  po qd Patient encouraged to call clinic with any questions, comments, or concerns.   I discussed the assessment and treatment plan with the patient. The patient was provided an opportunity to ask questions and all were answered. The patient agreed with the plan and demonstrated an understanding of the instructions.   The patient was advised to call back or seek an in-person evaluation if the symptoms worsen or if the condition fails to improve as anticipated.  I provided 14 minutes of non-face-to-face time during this encounter.  Maximiano Coss, NP

## 2022-01-12 NOTE — Telephone Encounter (Signed)
I have made this patient an appointment for today to discuss these concerns

## 2022-01-12 NOTE — Patient Instructions (Signed)
° ° ° °  If you have lab work done today you will be contacted with your lab results within the next 2 weeks.  If you have not heard from us then please contact us. The fastest way to get your results is to register for My Chart. ° ° °IF you received an x-ray today, you will receive an invoice from [facility]. Please contact [facility] at [phone] with questions or concerns regarding your invoice.  ° °IF you received labwork today, you will receive an invoice from [facility]. Please contact [facility] at [phone] with questions or concerns regarding your invoice.  ° °Our billing staff will not be able to assist you with questions regarding bills from these companies. ° °You will be contacted with the lab results as soon as they are available. The fastest way to get your results is to activate your My Chart account. Instructions are located on the last page of this paperwork. If you have not heard from us regarding the results in 2 weeks, please contact this office. °  ° ° ° °

## 2022-02-06 NOTE — Progress Notes (Signed)
02/09/22- 5 yoF never smoker for sleep evaluation courtesy of Maximiano Coss, NP with concern of OSA ?Medical problem list includes HTN, Asthma,  ?Epworth score-3 ?Body weight today-200 lbs ?Covid vax-3 Moderna ?Flu vax-had ?Husband tells her she snores loudly. Mother snored. 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 ?Using hydroxyzine occ for sleep. Caffeine reported as 2 Peps[employer] and 1 energy drink daily. ? ?Prior to Admission medications   ?Medication Sig Start Date End Date Taking? Authorizing Provider  ?albuterol (VENTOLIN HFA) 108 (90 Base) MCG/ACT inhaler Inhale 2 puffs into the lungs every 6 (six) hours as needed for wheezing or shortness of breath. 09/11/20  Yes Maximiano Coss, NP  ?amLODipine (NORVASC) 5 MG tablet Take 1 tablet (5 mg total) by mouth daily. 01/12/22  Yes Maximiano Coss, NP  ?azelastine (ASTELIN) 0.1 % nasal spray Place 1 spray into both nostrils 2 (two) times daily. Use in each nostril as directed 09/11/20  Yes Maximiano Coss, NP  ?cetirizine (ZYRTEC) 10 MG tablet TAKE 1 TABLET(10 MG) BY MOUTH DAILY 09/12/21  Yes Maximiano Coss, NP  ?citalopram (CELEXA) 20 MG tablet Take 1 tablet (20 mg total) by mouth daily. 01/12/22  Yes Maximiano Coss, NP  ?cyclobenzaprine (FLEXERIL) 5 MG tablet Take 1 tablet (5 mg total) by mouth 3 (three) times daily as needed for muscle spasms. 10/02/21  Yes [name], MD  ?diclofenac (VOLTAREN) 75 MG EC tablet Take 1 tablet (75 mg total) by mouth 2 (two) times daily. 08/14/21  Yes Maximiano Coss, NP  ?fluticasone (FLONASE) 50 MCG/ACT nasal spray Place 2 sprays into both nostrils daily. 04/29/21  Yes Maximiano Coss, NP  ?guaiFENesin-dextromethorphan (ROBITUSSIN DM) 100-10 MG/5ML syrup Take 5 mLs by mouth every 4 (four) hours as needed for cough. 04/29/21  Yes Maximiano Coss, NP  ?hydrOXYzine (ATARAX) 10 MG tablet Take 0.5-1 tablets (5-10 mg total) by mouth 3 (three) times daily as needed. 12/18/21  Yes  Maximiano Coss, NP  ?ibuprofen (ADVIL,MOTRIN) 200 MG tablet Take 200 mg by mouth as needed.   Yes [provider]  ?Multiple Vitamins-Minerals (MULTIVITAMIN PO) Take 1 tablet by mouth daily as needed (supplemental).    Yes [provider]  ?norethindrone (MICRONOR) 0.35 MG tablet Take 1 tablet (0.35 mg total) by mouth daily. 02/09/22  Yes Maximiano Coss, NP  ?propranolol ER (INDERAL LA) 60 MG 24 hr capsule Take 1 capsule (60 mg total) by mouth daily. For headaches 01/12/22  Yes Maximiano Coss, NP  ?Semaglutide,0.25 or 0.'5MG'$ /DOS, 2 MG/1.5ML SOPN Inject 0.5 mg into the skin once a week. 01/12/22  Yes Maximiano Coss, NP  ?triamcinolone cream (KENALOG) 0.1 % Apply 1 application topically 2 (two) times daily. 08/14/21  Yes Maximiano Coss, NP  ?valACYclovir (VALTREX) 1000 MG tablet TAKE 1 TABLET BY MOUTH DAILY AT ONSET OF OUTBREAK 09/12/21  Yes Maximiano Coss, NP  ?Vitamin D, Ergocalciferol, (DRISDOL) 1.25 MG (50000 UNIT) CAPS capsule Take 1 capsule (50,000 Units total) by mouth every 7 (seven) days. 12/19/21  Yes Maximiano Coss, NP  ? ?Past Medical History:  ?Diagnosis Date  ? Abnormal Pap smear   ? many yrs ago, 2020 LGSIL  ? Asthma   ? Depression   ? Fibroids   ? Hypertension   ? 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 Vaginal wall cyst   ? ?Past Surgical History:  ?Procedure Laterality Date  ? CHOLECYSTECTOMY N/A 05/11/2014  ? Procedure: LAPAROSCOPIC CHOLECYSTECTOMY WITH INTRAOPERATIVE CHOLANGIOGRAM;  Surgeon: Lavone Neri  Ellison Carwin, MD;  Location: [facility];  Service: General;  Laterality: N/A;  ? CHOLECYSTECTOMY  04/2014  ? COLPOSCOPY    ? CRYOTHERAPY    ? for abnormal pap  ? DILATION AND CURETTAGE OF UTERUS    ? LAPAROSCOPIC APPENDECTOMY N/A 11/05/2017  ? Procedure: APPENDECTOMY LAPAROSCOPIC;  Surgeon: Coralie Keens, MD;  Location: [facility];  Service: General;  Laterality: N/A;  ? ?Family History  ?Problem Relation Age of Onset  ? Diabetes Mother   ? Cancer Mother   ?     leukemia  ? Lupus Mother    ? Hypertension Mother   ? Hypertension Father   ? Breast cancer Maternal Aunt   ? ?Social History  ? ?Socioeconomic History  ? Marital status: Married  ?  Spouse name: Not on file  ? Number of children: Not on file  ? Years of education: Not on file  ? Highest education level: Not on file  ?Occupational History  ? Not on file  ?Tobacco Use  ? Smoking status: Never  ? Smokeless tobacco: Never  ?Vaping Use  ? Vaping Use: Never used  ?Substance and Sexual Activity  ? Alcohol use: Yes  ?  Comment: 1 a day  ? Drug use: No  ? Sexual activity: Yes  ?  Partners: Male  ?  Birth control/protection: Pill  ?Other Topics Concern  ? Not on file  ?Social History Narrative  ? Not on file  ? ?Social Determinants of Health  ? ?Financial Resource Strain: Not on file  ?Food Insecurity: Not on file  ?Transportation Needs: Not on file  ?Physical Activity: Not on file  ?Stress: Not on file  ?Social Connections: Not on file  ?Intimate Partner Violence: Not on file  ? ?ROS-see HPI   + = positive ?Constitutional:    weight loss, night sweats, fevers, chills, fatigue, lassitude. ?HEENT:    headaches, difficulty swallowing, tooth/dental problems, sore throat,  ?     sneezing, itching, ear ache, nasal congestion, post nasal drip, snoring ?CV:    chest pain, orthopnea, PND, swelling in lower extremities, anasarca,                                  dizziness, palpitations ?Resp:   shortness of breath with exertion or at rest.   ?             productive cough,   non-productive cough, coughing up of blood.   ?           change in color of mucus.  wheezing.   ?Skin:    rash or lesions. ?GI:  No-   heartburn, indigestion, abdominal pain, nausea, vomiting, diarrhea,  ?               change in bowel habits, loss of appetite ?GU: dysuria, change in color of urine, no urgency or frequency.   flank pain. ?MS:   joint pain, stiffness, decreased range of motion, back pain. ?Neuro-     nothing unusual ?Psych:  change in mood or affect.  depression or  anxiety.   memory loss. ? ?OBJ- Physical Exam ?General- Alert, Oriented, Affect-appropriate, Distress- none acute, + obese ?Skin- rash-none, lesions- none, excoriation- none ?Lymphadenopathy- none ?Head- atraumatic ?           Eyes- Gross vision intact, PERRLA, conjunctivae and secretions clear ?           Ears-  Hearing, canals-normal ?           Nose- Clear, no-Septal dev, mucus, polyps, erosion, perforation  ?           Throat- Mallampati VI , mucosa clear , drainage- none, tonsils- atrophic, + teeth ?Neck- flexible , trachea midline, no stridor , thyroid nl, carotid no bruit ?Chest - symmetrical excursion , unlabored ?          Heart/CV- RRR , no murmur , no gallop  , no rub, nl s1 s2 ?                          - JVD- none , edema- none, stasis changes- none, varices- none ?          Lung- clear to P&A, wheeze- none, cough- none , dullness-none, rub- none ?          Chest wall-  ?Abd-  ?Br/ Gen/ Rectal- Not done, not indicated ?Extrem- cyanosis- none, clubbing, none, atrophy- none, strength- nl ?Neuro- grossly intact to observation ? ?

## 2022-02-09 ENCOUNTER — Telehealth: Payer: Self-pay

## 2022-02-09 ENCOUNTER — Encounter: Payer: Self-pay | Admitting: Internal Medicine

## 2022-02-09 ENCOUNTER — Other Ambulatory Visit: Payer: Self-pay

## 2022-02-09 ENCOUNTER — Ambulatory Visit (INDEPENDENT_AMBULATORY_CARE_PROVIDER_SITE_OTHER): Payer: PRIVATE HEALTH INSURANCE | Admitting: Internal Medicine

## 2022-02-09 VITALS — BP 118/82 | HR 71 | Temp 98.2°F | Ht 62.0 in | Wt 200.4 lb

## 2022-02-09 DIAGNOSIS — R0683 Snoring: Secondary | ICD-10-CM

## 2022-02-09 DIAGNOSIS — I10 Essential (primary) hypertension: Secondary | ICD-10-CM

## 2022-02-09 DIAGNOSIS — Z3041 Encounter for surveillance of contraceptive pills: Secondary | ICD-10-CM

## 2022-02-09 MED ORDER — NORETHINDRONE 0.35 MG PO TABS: 1.0000 | ORAL_TABLET | Freq: Every day | ORAL | 4 refills | Status: DC

## 2022-02-09 NOTE — Telephone Encounter (Signed)
Medication has been sent to the pharmacy. 

## 2022-02-09 NOTE — Telephone Encounter (Signed)
Medication sent to the pharmacy.

## 2022-02-09 NOTE — Assessment & Plan Note (Signed)
Explained association between OSA and HTN. ?

## 2022-02-09 NOTE — Assessment & Plan Note (Signed)
Discussed sleep hygiene, snoring, sleep apnea, testing, possible treatments ?Plan- sleep study ?

## 2022-02-09 NOTE — Patient Instructions (Signed)
Order- schedule home sleep test   dx Snoring ? ?Please call us for results and recommendations about 2 weeks after your sleep test ?

## 2022-02-12 ENCOUNTER — Other Ambulatory Visit: Payer: Self-pay

## 2022-02-12 ENCOUNTER — Telehealth: Payer: PRIVATE HEALTH INSURANCE | Admitting: Registered Nurse

## 2022-02-12 ENCOUNTER — Ambulatory Visit: Payer: PRIVATE HEALTH INSURANCE | Admitting: Registered Nurse

## 2022-02-12 ENCOUNTER — Encounter: Payer: Self-pay | Admitting: Registered Nurse

## 2022-02-12 DIAGNOSIS — F4323 Adjustment disorder with mixed anxiety and depressed mood: Secondary | ICD-10-CM

## 2022-02-12 DIAGNOSIS — F32A Depression, unspecified: Secondary | ICD-10-CM | POA: Diagnosis not present

## 2022-02-12 DIAGNOSIS — E66812 Obesity, class 2: Secondary | ICD-10-CM | POA: Insufficient documentation

## 2022-02-12 DIAGNOSIS — I10 Essential (primary) hypertension: Secondary | ICD-10-CM | POA: Diagnosis not present

## 2022-02-12 DIAGNOSIS — E669 Obesity, unspecified: Secondary | ICD-10-CM | POA: Diagnosis not present

## 2022-02-12 MED ORDER — AMLODIPINE BESYLATE 5 MG PO TABS: 5.0000 mg | ORAL_TABLET | Freq: Every day | ORAL | 3 refills | Status: DC

## 2022-02-12 MED ORDER — PHENTERMINE HCL 37.5 MG PO CAPS: 37.5000 mg | ORAL_CAPSULE | ORAL | 0 refills | Status: DC

## 2022-02-12 MED ORDER — CITALOPRAM HYDROBROMIDE 20 MG PO TABS: 20.0000 mg | ORAL_TABLET | Freq: Every day | ORAL | 3 refills | Status: DC

## 2022-02-12 NOTE — Progress Notes (Signed)
? ? ?Telemedicine Encounter- SOAP NOTE Established Patient ? ?This telephone encounter was conducted with the patient's (or proxy's) verbal consent via audio telecommunications: yes/no: Yes ?Patient was instructed to have this encounter in a suitably private space; and to only have persons present to whom they give permission to participate. In addition, patient identity was confirmed by use of name plus two identifiers (DOB and address).  I discussed the limitations, risks, security and privacy concerns of performing an evaluation and management service by telephone and the availability of in person appointments. I also discussed with the patient that there may be a patient responsible charge related to this service. The patient expressed understanding and agreed to proceed. ? ?I spent a total of 16 minutes talking with the patient or their proxy. ? ?Patient at home ?Provider in office ? ?Participants: Beth Ruddy, NP and Beth Frost ? ?Chief Complaint  ?Patient presents with  ? Follow-up  ?  Patient states she is just here for a follow up and also discuss alternative weight loss medication  ? ? ?Subjective  ? ?Beth Frost is a 51 y.o. established patient. Telephone visit today for follow up ? ?HPI ?Mental health ?Celexa '20mg'$  po qd ?Effective, no AE. ?Feeling much better. ?Does not want to change at this time. ? ?Weight management ?Semaglutide - appears it requires PA ?Interested in phentermine and med weight management in the interim ?Has not seen mwm before ?Has previously used phentermine with good effect ? ?Refills ?Due for amlodipine refills ?Bp has been well controlled. ?No AE.  ? ? ?Patient Active Problem List  ? Diagnosis Date Noted  ? Obesity, Class II, BMI 35-39.9 02/12/2022  ? Depression   ? Snoring 02/09/2022  ? Fever, unspecified 06/01/2019  ? Malaise 06/01/2019  ? Asthma 05/13/2019  ? Hypertensive disorder 05/13/2019  ? Acute appendicitis 11/04/2017  ? Essential hypertension 11/26/2016  ?   Class: Family History of  ? Herpes genitalis in women 07/18/2014  ?  Class: History of  ? ? ?Past Medical History:  ?Diagnosis Date  ? Abnormal Pap smear   ? many yrs ago, 2020 LGSIL  ? Asthma   ? Depression   ? Fibroids   ? Hypertension   ? 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 Vaginal wall cyst   ? ? ?Current Outpatient Medications  ?Medication Sig Dispense Refill  ? phentermine 37.5 MG capsule Take 1 capsule (37.5 mg total) by mouth every morning. 90 capsule 0  ? albuterol (VENTOLIN HFA) 108 (90 Base) MCG/ACT inhaler Inhale 2 puffs into the lungs every 6 (six) hours as needed for wheezing or shortness of breath. 18 g 5  ? amLODipine (NORVASC) 5 MG tablet Take 1 tablet (5 mg total) by mouth daily. 90 tablet 3  ? azelastine (ASTELIN) 0.1 % nasal spray Place 1 spray into both nostrils 2 (two) times daily. Use in each nostril as directed 30 mL 12  ? cetirizine (ZYRTEC) 10 MG tablet TAKE 1 TABLET(10 MG) BY MOUTH DAILY 30 tablet 11  ? citalopram (CELEXA) 20 MG tablet Take 1 tablet (20 mg total) by mouth daily. 90 tablet 3  ? cyclobenzaprine (FLEXERIL) 5 MG tablet Take 1 tablet (5 mg total) by mouth 3 (three) times daily as needed for muscle spasms. 30 tablet 11  ? diclofenac (VOLTAREN) 75 MG EC tablet Take 1 tablet (75 mg total) by mouth 2 (two) times daily. 30 tablet 0  ? fluticasone (FLONASE) 50 MCG/ACT nasal spray Place  2 sprays into both nostrils daily. 16 g 6  ? guaiFENesin-dextromethorphan (ROBITUSSIN DM) 100-10 MG/5ML syrup Take 5 mLs by mouth every 4 (four) hours as needed for cough. 118 mL 0  ? hydrOXYzine (ATARAX) 10 MG tablet Take 0.5-1 tablets (5-10 mg total) by mouth 3 (three) times daily as needed. 30 tablet 0  ? ibuprofen (ADVIL,MOTRIN) 200 MG tablet Take 200 mg by mouth as needed.    ? Multiple Vitamins-Minerals (MULTIVITAMIN PO) Take 1 tablet by mouth daily as needed (supplemental).     ? norethindrone (MICRONOR) 0.35 MG tablet Take 1 tablet (0.35 mg total) by mouth daily. 84 tablet 4  ?  propranolol ER (INDERAL LA) 60 MG 24 hr capsule Take 1 capsule (60 mg total) by mouth daily. For headaches 30 capsule 0  ? Semaglutide,0.25 or 0.'5MG'$ /DOS, 2 MG/1.5ML SOPN Inject 0.5 mg into the skin once a week. 4.5 mL 1  ? triamcinolone cream (KENALOG) 0.1 % Apply 1 application topically 2 (two) times daily. 30 g 0  ? valACYclovir (VALTREX) 1000 MG tablet TAKE 1 TABLET BY MOUTH DAILY AT ONSET OF OUTBREAK 45 tablet 12  ? Vitamin D, Ergocalciferol, (DRISDOL) 1.25 MG (50000 UNIT) CAPS capsule Take 1 capsule (50,000 Units total) by mouth every 7 (seven) days. 12 capsule 1  ? ?No current facility-administered medications for this visit.  ? ? ?Allergies  ?Allergen Reactions  ? Flagyl [Metronidazole] Hives  ?  Itchy all over  ? ? ?Social History  ? ?Socioeconomic History  ? Marital status: Married  ?  Spouse name: Not on file  ? Number of children: Not on file  ? Years of education: Not on file  ? Highest education level: Not on file  ?Occupational History  ? Not on file  ?Tobacco Use  ? Smoking status: Never  ? Smokeless tobacco: Never  ?Vaping Use  ? Vaping Use: Never used  ?Substance and Sexual Activity  ? Alcohol use: Yes  ?  Comment: 1 a day  ? Drug use: No  ? Sexual activity: Yes  ?  Partners: Male  ?  Birth control/protection: Pill  ?Other Topics Concern  ? Not on file  ?Social History Narrative  ? Not on file  ? ?Social Determinants of Health  ? ?Financial Resource Strain: Not on file  ?Food Insecurity: Not on file  ?Transportation Needs: Not on file  ?Physical Activity: Not on file  ?Stress: Not on file  ?Social Connections: Not on file  ?Intimate Partner Violence: Not on file  ? ? ?ROS ? ?Objective  ? ?Vitals as reported by the patient: ?There were no vitals filed for this visit. ? ?Beth Frost was seen today for follow-up. ? ?Diagnoses and all orders for this visit: ? ?Obesity, Class II, BMI 35-39.9 ?-     Amb Ref to Medical Weight Management ?-     phentermine 37.5 MG capsule; Take 1 capsule (37.5 mg total) by  mouth every morning. ? ?Adjustment disorder with mixed anxiety and depressed mood ?-     citalopram (CELEXA) 20 MG tablet; Take 1 tablet (20 mg total) by mouth daily. ? ?Essential hypertension ?-     amLODipine (NORVASC) 5 MG tablet; Take 1 tablet (5 mg total) by mouth daily. ? ?Depression, unspecified depression type ? ? ? ? ?I discussed the assessment and treatment plan with the patient. The patient was provided an opportunity to ask questions and all were answered. The patient agreed with the plan and demonstrated an understanding of the instructions. ?  ?  The patient was advised to call back or seek an in-person evaluation if the symptoms worsen or if the condition fails to improve as anticipated. ? ?I provided 16 minutes of non-face-to-face time during this encounter. ? ?Maximiano Coss, NP ? ?

## 2022-02-12 NOTE — Assessment & Plan Note (Signed)
Managed with celexa '20mg'$ . Stable. Continue current dose.  ?

## 2022-02-12 NOTE — Assessment & Plan Note (Signed)
Start phentermine 37.'5mg'$  po qd. Reviewed risks, benefits, and side effects, pt voices understanding. ?Refer to medical weight management. ?

## 2022-02-12 NOTE — Assessment & Plan Note (Signed)
BP well controlled. Refills x 6 mo, return for CPE and labs at that time. ?

## 2022-02-12 NOTE — Patient Instructions (Addendum)
Beth Frost - ? ?Great to speak with you ?Start phentermine ? ?Continue celexa and amlodipine ? ?I have referred to medical weight management ? ?See you in 6 mo for a physical and labs ? ?Call sooner with concerns ? ?Thanks, ? ?Rich  ?

## 2022-04-01 ENCOUNTER — Ambulatory Visit: Payer: PRIVATE HEALTH INSURANCE | Admitting: Obstetrics and Gynecology

## 2022-04-10 ENCOUNTER — Encounter: Payer: Self-pay | Admitting: Obstetrics and Gynecology

## 2022-04-10 ENCOUNTER — Telehealth (INDEPENDENT_AMBULATORY_CARE_PROVIDER_SITE_OTHER): Payer: PRIVATE HEALTH INSURANCE | Admitting: Obstetrics and Gynecology

## 2022-04-10 DIAGNOSIS — M62838 Other muscle spasm: Secondary | ICD-10-CM

## 2022-04-10 DIAGNOSIS — N941 Unspecified dyspareunia: Secondary | ICD-10-CM

## 2022-04-10 NOTE — Progress Notes (Signed)
Beth Frost Return Visit- video  Patient location: [facility] Provider location: in office ([place]) Patient consented to video visit and was the only person on the call.   SUBJECTIVE  History of Present Illness: Beth Frost is a 51 y.o. female seen in follow-up for dyspareunia, levator spasm.  She is taking the flexeril prior to intercourse and it has been helping. She is taking a full pill usually. She still has some pain when she takes a half pill or does not take it at   Past Medical History: Patient  has a past medical history of Abnormal Pap smear, Asthma, Depression, Fibroids, Hypertension, STD (sexually transmitted disease), and Vaginal wall cyst.   Past Surgical History: She  has a past surgical history that includes Colposcopy; Cryotherapy; Dilation and curettage of uterus; Cholecystectomy (N/A, 05/11/2014); Cholecystectomy (04/2014); and laparoscopic appendectomy (N/A, 11/05/2017).   Medications: She has a current medication list which includes the following prescription(s): albuterol, amlodipine, azelastine, cetirizine, citalopram, cyclobenzaprine, diclofenac, fluticasone, guaifenesin-dextromethorphan, hydroxyzine, ibuprofen, multiple vitamin, norethindrone, phentermine, propranolol er, semaglutide(0.25 or 0.'5mg'$ /dos), triamcinolone cream, valacyclovir, and vitamin d (ergocalciferol).   Allergies: Patient is allergic to flagyl [metronidazole].   Social History: Patient  reports that she has never smoked. She has never used smokeless tobacco. She reports current alcohol use. She reports that she does not use drugs.      OBJECTIVE     Physical Exam: Gen: No apparent distress, A&O x 3.     ASSESSMENT AND PLAN    Ms. [name] is a 51 y.o. with:  1. Levator spasm   2. Dyspareunia, female     - Continue flexeril '5mg'$  as needed for levator pain/ dyspareunia - Discussed additional options for possibly longer term treatment including trigger point  injections or pelvic PT. She is interested in pelvic PT, referral placed.   Return 6 months to evaluate progress  [name], MD  Time spent: I spent 20 minutes dedicated to the care of this patient on the date of this encounter to include pre-visit review of records, face-to-face time with the patient and post visit documentation and ordering medication/ testing.

## 2022-04-22 ENCOUNTER — Ambulatory Visit: Payer: PRIVATE HEALTH INSURANCE

## 2022-04-22 DIAGNOSIS — R0683 Snoring: Secondary | ICD-10-CM

## 2022-04-23 ENCOUNTER — Ambulatory Visit: Payer: PRIVATE HEALTH INSURANCE | Admitting: Registered Nurse

## 2022-04-23 ENCOUNTER — Ambulatory Visit: Payer: PRIVATE HEALTH INSURANCE | Admitting: Obstetrics and Gynecology

## 2022-04-23 NOTE — Progress Notes (Deleted)
GYNECOLOGY  VISIT   HPI: 51 y.o.   Married  [place] American  female   [phone] with No LMP recorded.   here for     GYNECOLOGIC HISTORY: No LMP recorded. Contraception:  ***OCPs Menopausal hormone therapy:  *** Last mammogram:  11-27-21 Neg/BiRads1 Last pap smear:   12-09-2018 LGSIL HPV HR neg, 12-18-2019 neg HPV HR neg        OB History     Gravida  4   Para  2   Term  2   Preterm      AB  2   Living  2      SAB  1   IAB  1   Ectopic      Multiple      Live Births  2              Patient Active Problem List   Diagnosis Date Noted   Obesity, Class II, BMI 35-39.9 02/12/2022   Depression    Snoring 02/09/2022   Fever, unspecified 06/01/2019   Malaise 06/01/2019   Asthma 05/13/2019   Hypertensive disorder 05/13/2019   Acute appendicitis 11/04/2017   Essential hypertension 11/26/2016    Class: Family History of   Herpes genitalis in women 07/18/2014    Class: History of    Past Medical History:  Diagnosis Date   Abnormal Pap smear    many yrs ago, 2020 LGSIL   Asthma    Depression    Fibroids    Hypertension    STD (sexually transmitted disease)    HSV2   Vaginal wall cyst     Past Surgical History:  Procedure Laterality Date   CHOLECYSTECTOMY N/A 05/11/2014   Procedure: LAPAROSCOPIC CHOLECYSTECTOMY WITH INTRAOPERATIVE CHOLANGIOGRAM;  Surgeon: Zenovia Jarred, MD;  Location: [facility];  Service: General;  Laterality: N/A;   CHOLECYSTECTOMY  04/2014   COLPOSCOPY     CRYOTHERAPY     for abnormal pap   DILATION AND CURETTAGE OF UTERUS     LAPAROSCOPIC APPENDECTOMY N/A 11/05/2017   Procedure: APPENDECTOMY LAPAROSCOPIC;  Surgeon: Coralie Keens, MD;  Location: [place];  Service: General;  Laterality: N/A;    Current Outpatient Medications  Medication Sig Dispense Refill   albuterol (VENTOLIN HFA) 108 (90 Base) MCG/ACT inhaler Inhale 2 puffs into the lungs every 6 (six) hours as needed for wheezing or shortness of breath. 18 g 5   amLODipine  (NORVASC) 5 MG tablet Take 1 tablet (5 mg total) by mouth daily. 90 tablet 3   azelastine (ASTELIN) 0.1 % nasal spray Place 1 spray into both nostrils 2 (two) times daily. Use in each nostril as directed 30 mL 12   cetirizine (ZYRTEC) 10 MG tablet TAKE 1 TABLET(10 MG) BY MOUTH DAILY 30 tablet 11   citalopram (CELEXA) 20 MG tablet Take 1 tablet (20 mg total) by mouth daily. 90 tablet 3   cyclobenzaprine (FLEXERIL) 5 MG tablet Take 1 tablet (5 mg total) by mouth 3 (three) times daily as needed for muscle spasms. 30 tablet 11   diclofenac (VOLTAREN) 75 MG EC tablet Take 1 tablet (75 mg total) by mouth 2 (two) times daily. 30 tablet 0   fluticasone (FLONASE) 50 MCG/ACT nasal spray Place 2 sprays into both nostrils daily. 16 g 6   guaiFENesin-dextromethorphan (ROBITUSSIN DM) 100-10 MG/5ML syrup Take 5 mLs by mouth every 4 (four) hours as needed for cough. 118 mL 0   hydrOXYzine (ATARAX) 10 MG tablet Take 0.5-1 tablets (5-10 mg  total) by mouth 3 (three) times daily as needed. 30 tablet 0   ibuprofen (ADVIL,MOTRIN) 200 MG tablet Take 200 mg by mouth as needed.     Multiple Vitamins-Minerals (MULTIVITAMIN PO) Take 1 tablet by mouth daily as needed (supplemental).      norethindrone (MICRONOR) 0.35 MG tablet Take 1 tablet (0.35 mg total) by mouth daily. 84 tablet 4   phentermine 37.5 MG capsule Take 1 capsule (37.5 mg total) by mouth every morning. 90 capsule 0   propranolol ER (INDERAL LA) 60 MG 24 hr capsule Take 1 capsule (60 mg total) by mouth daily. For headaches 30 capsule 0   Semaglutide,0.25 or 0.'5MG'$ /DOS, 2 MG/1.5ML SOPN Inject 0.5 mg into the skin once a week. 4.5 mL 1   triamcinolone cream (KENALOG) 0.1 % Apply 1 application topically 2 (two) times daily. 30 g 0   valACYclovir (VALTREX) 1000 MG tablet TAKE 1 TABLET BY MOUTH DAILY AT ONSET OF OUTBREAK 45 tablet 12   Vitamin D, Ergocalciferol, (DRISDOL) 1.25 MG (50000 UNIT) CAPS capsule Take 1 capsule (50,000 Units total) by mouth every 7 (seven)  days. 12 capsule 1   No current facility-administered medications for this visit.     ALLERGIES: Flagyl [metronidazole]  Family History  Problem Relation Age of Onset   Diabetes Mother    Cancer Mother        leukemia   Lupus Mother    Hypertension Mother    Hypertension Father    Breast cancer Maternal Aunt     Social History   Socioeconomic History   Marital status: Married    Spouse name: Not on file   Number of children: Not on file   Years of education: Not on file   Highest education level: Not on file  Occupational History   Not on file  Tobacco Use   Smoking status: Never   Smokeless tobacco: Never  Vaping Use   Vaping Use: Never used  Substance and Sexual Activity   Alcohol use: Yes    Comment: 1 a day   Drug use: No   Sexual activity: Yes    Partners: Male    Birth control/protection: Pill  Other Topics Concern   Not on file  Social History Narrative   Not on file   Social Determinants of Health   Financial Resource Strain: Not on file  Food Insecurity: Not on file  Transportation Needs: Not on file  Physical Activity: Not on file  Stress: Not on file  Social Connections: Not on file  Intimate Partner Violence: Not on file    Review of Systems  PHYSICAL EXAMINATION:    There were no vitals taken for this visit.    General appearance: alert, cooperative and appears stated age Head: Normocephalic, without obvious abnormality, atraumatic Neck: no adenopathy, supple, symmetrical, trachea midline and thyroid normal to inspection and palpation Lungs: clear to auscultation bilaterally Breasts: normal appearance, no masses or tenderness, No nipple retraction or dimpling, No nipple discharge or bleeding, No axillary or supraclavicular adenopathy Heart: regular rate and rhythm Abdomen: soft, non-tender, no masses,  no organomegaly Extremities: extremities normal, atraumatic, no cyanosis or edema Skin: Skin color, texture, turgor normal. No rashes  or lesions Lymph nodes: Cervical, supraclavicular, and axillary nodes normal. No abnormal inguinal nodes palpated Neurologic: Grossly normal  Pelvic: External genitalia:  no lesions              Urethra:  normal appearing urethra with no masses, tenderness or lesions  Bartholins and Skenes: normal                 Vagina: normal appearing vagina with normal color and discharge, no lesions              Cervix: no lesions                Bimanual Exam:  Uterus:  normal size, contour, position, consistency, mobility, non-tender              Adnexa: no mass, fullness, tenderness              Rectal exam: {yes no:[id number]}.  Confirms.              Anus:  normal sphincter tone, no lesions  Chaperone was present for exam:  ***  ASSESSMENT     PLAN     An After Visit Summary was printed and given to the patient.  ______ minutes face to face time of which over 50% was spent in counseling.

## 2022-04-24 ENCOUNTER — Ambulatory Visit: Payer: PRIVATE HEALTH INSURANCE | Admitting: Radiology

## 2022-04-24 VITALS — BP 122/82 | Temp 98.3°F

## 2022-04-24 DIAGNOSIS — R300 Dysuria: Secondary | ICD-10-CM | POA: Diagnosis not present

## 2022-04-24 DIAGNOSIS — N898 Other specified noninflammatory disorders of vagina: Secondary | ICD-10-CM | POA: Diagnosis not present

## 2022-04-24 DIAGNOSIS — R0683 Snoring: Secondary | ICD-10-CM | POA: Diagnosis not present

## 2022-04-24 LAB — WET PREP FOR TRICH, YEAST, CLUE

## 2022-04-24 MED ORDER — FLUCONAZOLE 150 MG PO TABS: 150.0000 mg | ORAL_TABLET | ORAL | 0 refills | Status: DC

## 2022-04-24 MED ORDER — NITROFURANTOIN MONOHYD MACRO 100 MG PO CAPS: 100.0000 mg | ORAL_CAPSULE | Freq: Two times a day (BID) | ORAL | 0 refills | Status: DC

## 2022-04-24 NOTE — Progress Notes (Signed)
      Subjective: Beth Frost is a 51 y.o. female who complains of burning with urination, urinary frequency, urgency, vaginal itching, no vaginal discharge. Tried monistat with no relief. Recently changed soap and laundry detergent.   Review of Systems  All other systems reviewed and are negative.   Objective:  -Vulva: without lesions or discharge -Vagina: discharge present, aptima swab and wet prep obtained -Cervix: no lesion or discharge, no CMT -Perineum: no lesions -Uterus: Mobile, non tender -Adnexa: no masses or tenderness -No CVAT  Urine dipstick shows positive for WBC's, positive for RBC's, positive for protein, and positive for ketones.   Microscopic wet-mount exam shows hyphae.   Chaperone offered and declined.  Assessment:/Plan:  1. Dysuria  - nitrofurantoin, macrocrystal-monohydrate, (MACROBID) 100 MG capsule; Take 1 capsule (100 mg total) by mouth 2 (two) times daily.  Dispense: 14 capsule; Refill: 0  2. Vaginal itching  - Urinalysis,Complete w/RFL Culture - WET PREP FOR TRICH, YEAST, CLUE - fluconazole (DIFLUCAN) 150 MG tablet; Take 1 tablet (150 mg total) by mouth every 3 (three) days.  Dispense: 2 tablet; Refill: 0     Will contact patient with results of testing completed today. Avoid intercourse until symptoms are resolved. Safe sex encouraged. Avoid the use of soaps or perfumed products in the peri area. Avoid tub baths and sitting in sweaty or wet clothing for prolonged periods of time.

## 2022-04-27 ENCOUNTER — Encounter: Payer: Self-pay | Admitting: Internal Medicine

## 2022-04-27 DIAGNOSIS — R300 Dysuria: Secondary | ICD-10-CM

## 2022-04-27 LAB — URINE CULTURE
MICRO NUMBER:: 13476242
SPECIMEN QUALITY:: ADEQUATE

## 2022-04-27 LAB — URINALYSIS, COMPLETE W/RFL CULTURE
Glucose, UA: NEGATIVE
Hyaline Cast: NONE SEEN /LPF
Nitrites, Initial: NEGATIVE
Specific Gravity, Urine: 1.02 (ref 1.001–1.035)
pH: 6.5 (ref 5.0–8.0)

## 2022-04-27 LAB — CULTURE INDICATED

## 2022-04-28 ENCOUNTER — Other Ambulatory Visit: Payer: Self-pay | Admitting: Nurse Practitioner

## 2022-04-28 ENCOUNTER — Telehealth: Payer: Self-pay | Admitting: *Deleted

## 2022-04-28 ENCOUNTER — Telehealth: Payer: Self-pay | Admitting: Internal Medicine

## 2022-04-28 DIAGNOSIS — N3000 Acute cystitis without hematuria: Secondary | ICD-10-CM

## 2022-04-28 MED ORDER — AMOXICILLIN-POT CLAVULANATE 500-125 MG PO TABS: 1.0000 | ORAL_TABLET | Freq: Two times a day (BID) | ORAL | 0 refills | Status: DC

## 2022-04-28 MED ORDER — AMOXICILLIN 500 MG PO TABS: 500.0000 mg | ORAL_TABLET | Freq: Two times a day (BID) | ORAL | 0 refills | Status: DC

## 2022-04-28 NOTE — Telephone Encounter (Signed)
Patient did not pickup Rx for Augmentin yet, I called pharmacy and cancelled Rx. Amoxacillin sent. Patient aware.

## 2022-04-28 NOTE — Telephone Encounter (Signed)
Her home sleep test was within normal limits with just an occasional apnea event. She does not have obstructive sleep apnea. Treatment for this is to sleep off the flat of her back and try to keep her weight down. She can cancel her return appointment, or can choose to keep it if she wants to discuss more.

## 2022-04-28 NOTE — Telephone Encounter (Signed)
Has she picked it up yet? If she has already picked up yesterdays she can take that, otherwise pick up what I sent today

## 2022-04-28 NOTE — Telephone Encounter (Signed)
-----   Message from Kerry Dory, NP sent at 04/28/2022  9:46 AM EDT ----- Switch antibiotic to Amox '500mg'$  po BID x 5 days.

## 2022-04-28 NOTE — Telephone Encounter (Signed)
Beth Frost called patient and she reports another Rx was sent in yesterday for Augmentin 500-125 mg tablet BID x 7 days. It appears patient sent my chart message and Tiffany prescribed Augmentin for patient. Please advise which Rx patient should take. Please advise

## 2022-04-29 NOTE — Telephone Encounter (Signed)
Called patient and she would like to go over the results for her home sleep test. Test was done 04/22/22.   Please advise sir

## 2022-04-29 NOTE — Telephone Encounter (Signed)
Her sleep study showed an occasional apnea, within normal limits, about 1-2 apneas/ hour.. She does not have sleep apnea syndrome. We usually recommend she avoid sleeping flat on her back and try to keep weight down. Ok for her to cancel f/u appointment, or I'm happy to see her again if she wants to discuss.

## 2022-04-29 NOTE — Telephone Encounter (Signed)
Called patient and went over sleep study results with her. She verbalized understanding. Nothing further needed

## 2022-05-05 ENCOUNTER — Other Ambulatory Visit: Payer: PRIVATE HEALTH INSURANCE

## 2022-05-05 ENCOUNTER — Other Ambulatory Visit: Payer: Self-pay

## 2022-05-05 DIAGNOSIS — R309 Painful micturition, unspecified: Secondary | ICD-10-CM

## 2022-05-05 NOTE — Telephone Encounter (Signed)
Sorry, lab visit. I'm use to it being called 'nurse visit' at my previous office.

## 2022-05-05 NOTE — Telephone Encounter (Signed)
I spoke with patient. She has finished the Augmentin prescription. She said she is much better but still has some pain at the start of urination. No pain while urinating, no frequency or any vaginal symptoms. This was the only sx she reported.

## 2022-05-05 NOTE — Telephone Encounter (Signed)
Please schedule for nurse visit to leave a urine sample to be sure it has resolved. Thanks

## 2022-05-05 NOTE — Telephone Encounter (Signed)
Spoke with patient and scheduled her a lab appointment to come by 3:30pm this afternoon and leave a urine specimen.  Wende Crease, NP, I was not clear if you wanted her to meet with a nurse when you said "nurse appointment?".

## 2022-05-06 LAB — URINE CULTURE
MICRO NUMBER:: 13518833
SPECIMEN QUALITY:: ADEQUATE

## 2022-05-06 LAB — URINALYSIS, COMPLETE W/RFL CULTURE
Bilirubin Urine: NEGATIVE
Glucose, UA: NEGATIVE
Hyaline Cast: NONE SEEN /LPF
Ketones, ur: NEGATIVE
Nitrites, Initial: NEGATIVE
Specific Gravity, Urine: 1.007 (ref 1.001–1.035)
pH: 5.5 (ref 5.0–8.0)

## 2022-05-06 LAB — CULTURE INDICATED

## 2022-05-06 NOTE — Telephone Encounter (Signed)
Pending culture, will be back tomorrow

## 2022-05-08 MED ORDER — PHENAZOPYRIDINE HCL 200 MG PO TABS: 200.0000 mg | ORAL_TABLET | Freq: Three times a day (TID) | ORAL | 0 refills | Status: AC | PRN

## 2022-05-08 NOTE — Telephone Encounter (Signed)
We can try pyridium '200mg'$  po TID x 3 days to help with the discomfort

## 2022-05-20 NOTE — Telephone Encounter (Signed)
Appointments please reach out to patient to schedule office visit with [facility] for urinary symptoms.

## 2022-05-21 ENCOUNTER — Ambulatory Visit: Payer: PRIVATE HEALTH INSURANCE | Admitting: Radiology

## 2022-05-21 VITALS — BP 100/60

## 2022-05-21 DIAGNOSIS — B9689 Other specified bacterial agents as the cause of diseases classified elsewhere: Secondary | ICD-10-CM | POA: Diagnosis not present

## 2022-05-21 DIAGNOSIS — R300 Dysuria: Secondary | ICD-10-CM

## 2022-05-21 DIAGNOSIS — N898 Other specified noninflammatory disorders of vagina: Secondary | ICD-10-CM

## 2022-05-21 DIAGNOSIS — N760 Acute vaginitis: Secondary | ICD-10-CM | POA: Diagnosis not present

## 2022-05-21 LAB — WET PREP FOR TRICH, YEAST, CLUE

## 2022-05-21 MED ORDER — METRONIDAZOLE 0.75 % VA GEL: 1.0000 | Freq: Every day | VAGINAL | 0 refills | Status: AC

## 2022-05-21 MED ORDER — AMOXICILLIN-POT CLAVULANATE 500-125 MG PO TABS: 1.0000 | ORAL_TABLET | Freq: Two times a day (BID) | ORAL | 0 refills | Status: DC

## 2022-05-21 NOTE — Progress Notes (Signed)
      Subjective: Beth Frost is a 51 y.o. female who complains of dysuria, frequency, no urgency, vaginal discharge, odor with intercourse, no itching. Elects STI screen.  - Sexually active with 16mle partner(s) - Last sexual encounter: 2 day ago -Contraception: OCP  Review of Systems  All other systems reviewed and are negative.    Objective:  -Vulva: without lesions or discharge -Vagina: thick white, fishy smelling discharge present, aptima swab and wet prep obtained -Cervix: no lesion or discharge, no CMT -Perineum: no lesions -Uterus: Mobile, non tender -Adnexa: no masses or tenderness - Abdomen SNT - no CVAT  Urine dipstick shows positive for RBC's.  Micro exam: 40-60 WBC's per HPF, 3-10 RBC's per HPF, and moderate + bacteria.  Microscopic wet-mount exam shows clue cells.   Chaperone offered and declined.  Assessment:/Plan:   1. Dysuria Augmentin rx sent  - Urinalysis,Complete w/RFL Culture  2. Vaginal discharge  - WET PREP FOR TRICH, YEAST, CLUE - SURESWAB CT/NG/T. vaginalis  3. Bacterial vaginosis Metrogel rx sent, pt get hives from flagyl, willing to try metrogel    Will contact patient with results of testing completed today. Avoid intercourse until symptoms are resolved. Safe sex encouraged. Avoid the use of soaps or perfumed products in the peri area. Avoid tub baths and sitting in sweaty or wet clothing for prolonged periods of time.

## 2022-05-23 LAB — URINALYSIS, COMPLETE W/RFL CULTURE
Bilirubin Urine: NEGATIVE
Glucose, UA: NEGATIVE
Ketones, ur: NEGATIVE
Nitrites, Initial: NEGATIVE
Specific Gravity, Urine: 1.015 (ref 1.001–1.035)
pH: 6.5 (ref 5.0–8.0)

## 2022-05-23 LAB — URINE CULTURE
MICRO NUMBER:: 13588674
Result:: NO GROWTH
SPECIMEN QUALITY:: ADEQUATE

## 2022-05-23 LAB — CULTURE INDICATED

## 2022-05-23 LAB — SURESWAB CT/NG/T. VAGINALIS
C. trachomatis RNA, TMA: NOT DETECTED
N. gonorrhoeae RNA, TMA: NOT DETECTED
Trichomonas vaginalis RNA: NOT DETECTED

## 2022-06-06 IMAGING — CR DG KNEE COMPLETE 4+V*L*
4 series · 4 of 4 positions shown · non-contrast
Comparison: None.

CLINICAL DATA: Chronic left knee pain for several months, no known
injury, initial encounter

EXAM:
LEFT KNEE - COMPLETE 4+ VIEW

[w knee ap left]
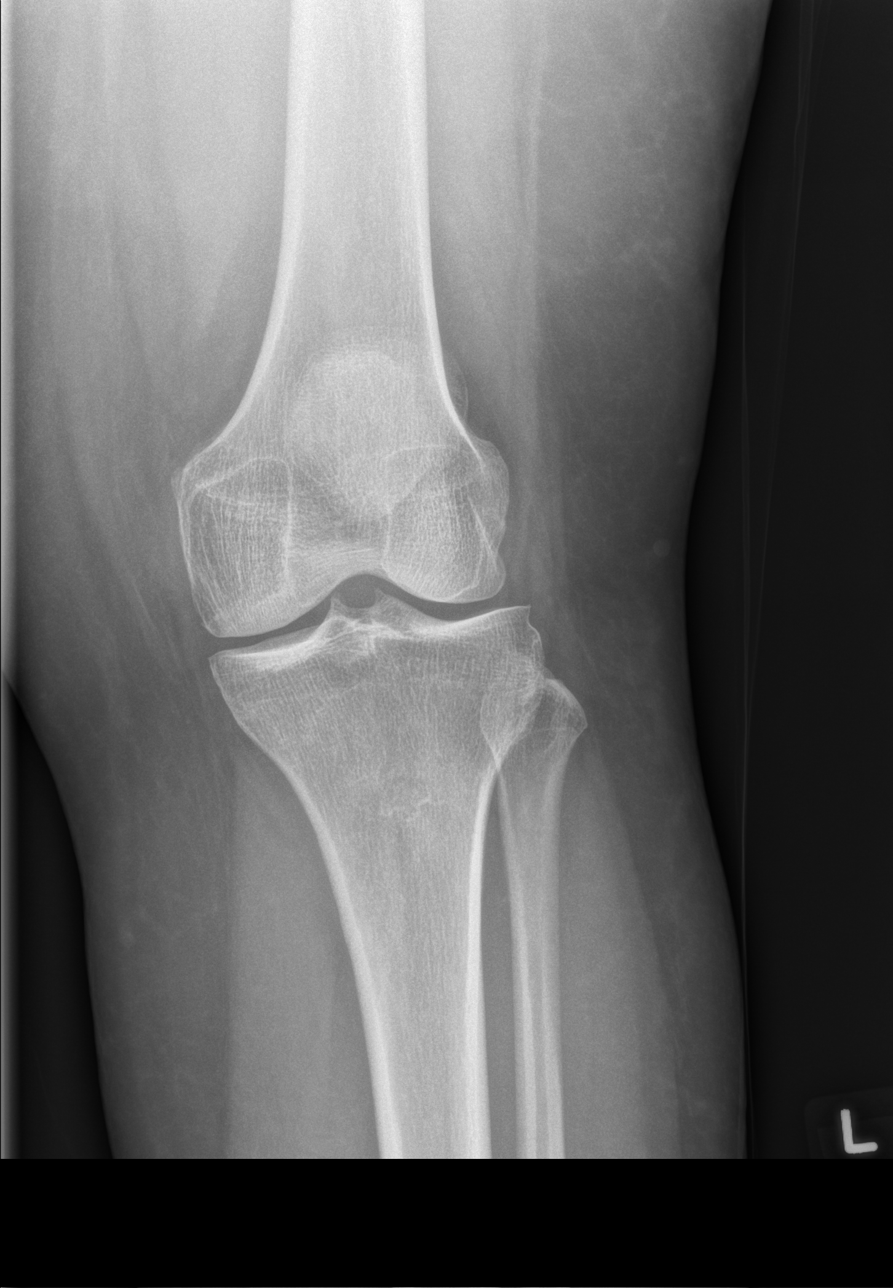

[w knee lat left]
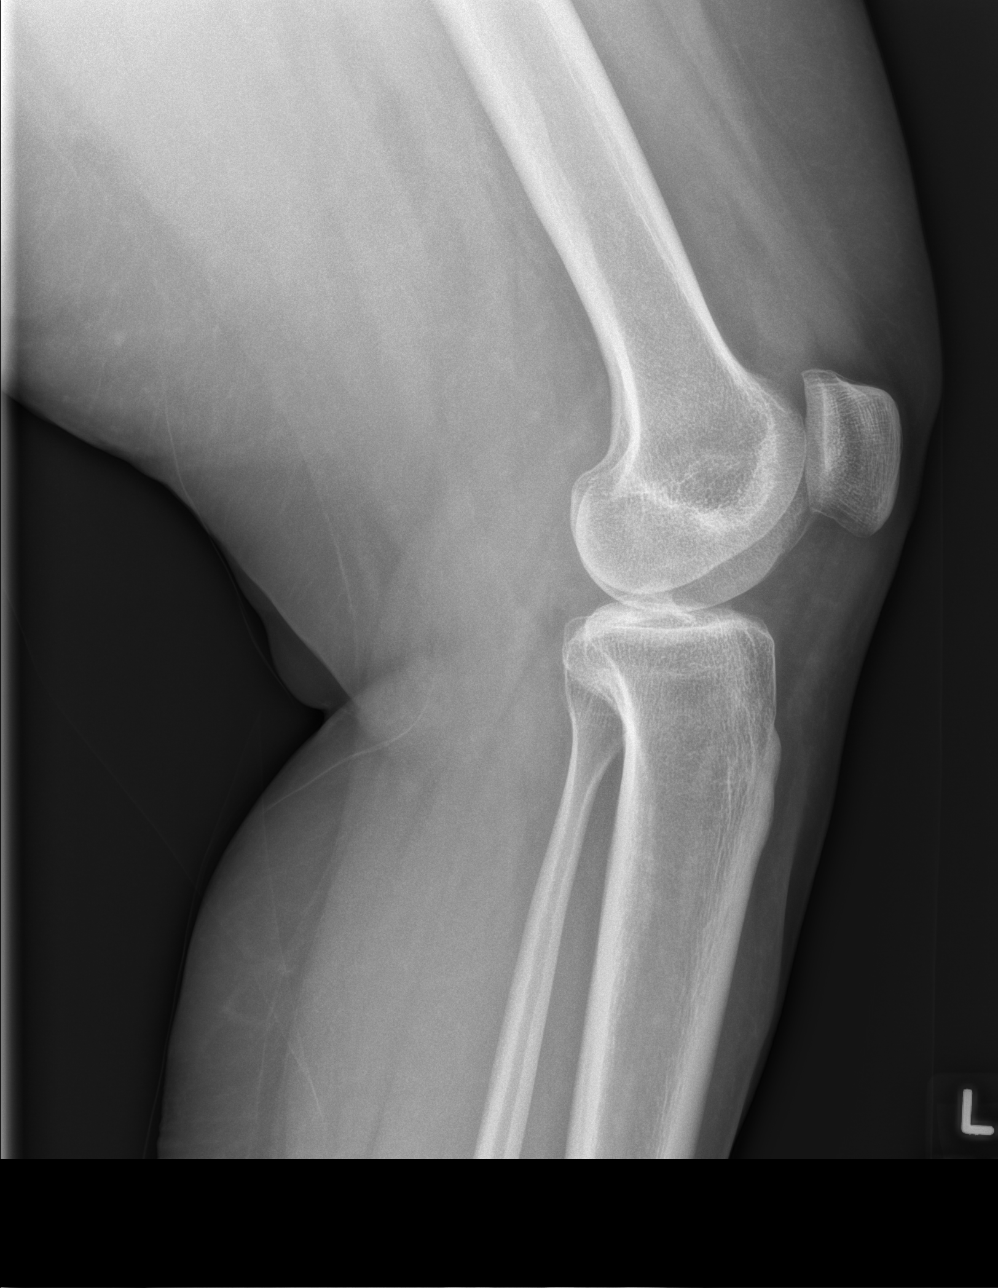

[w knee tunnel pa left]
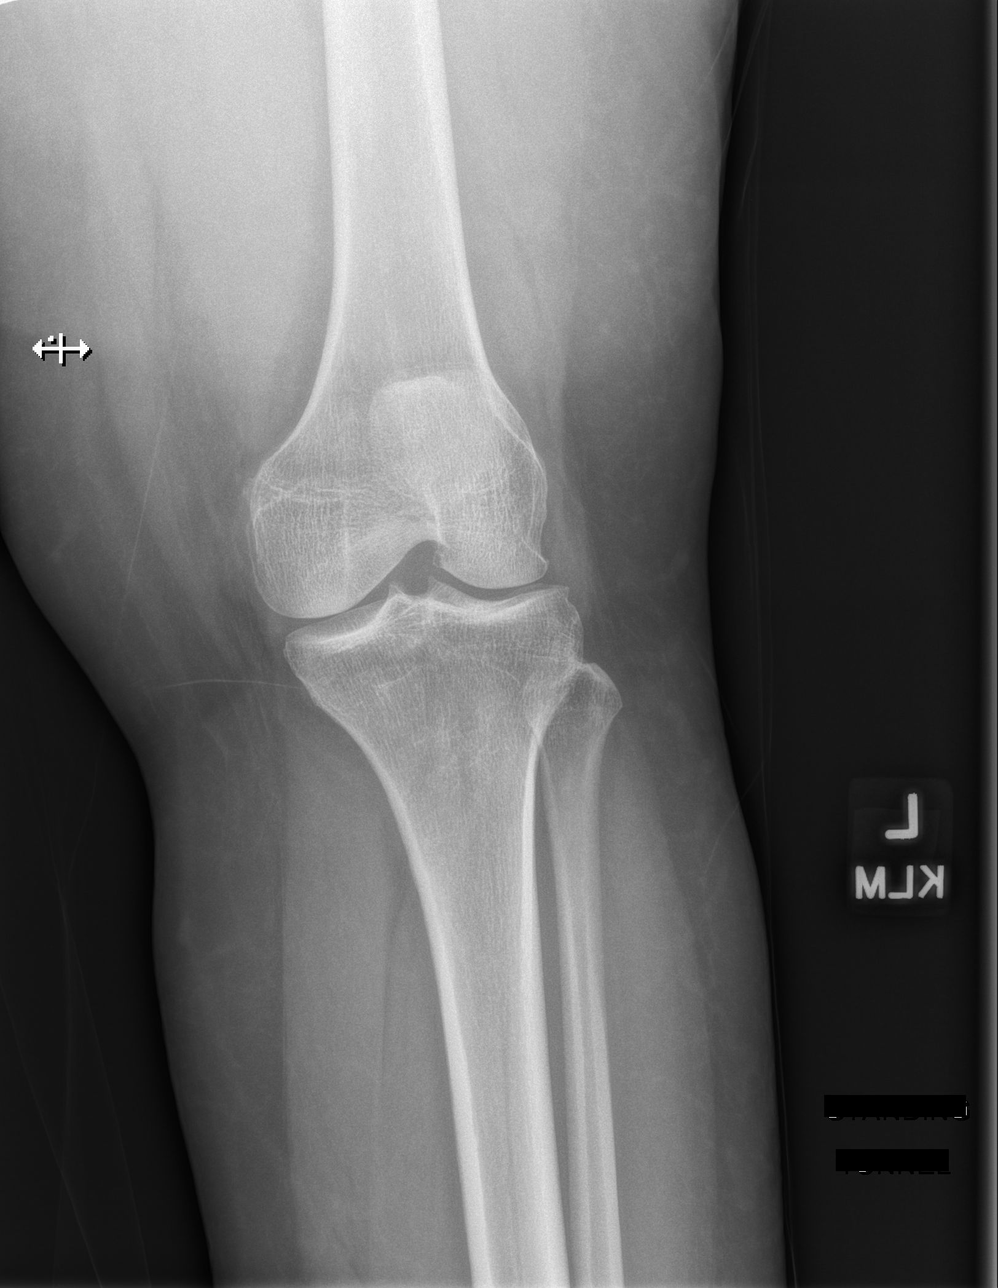

[x knee sunrise left]
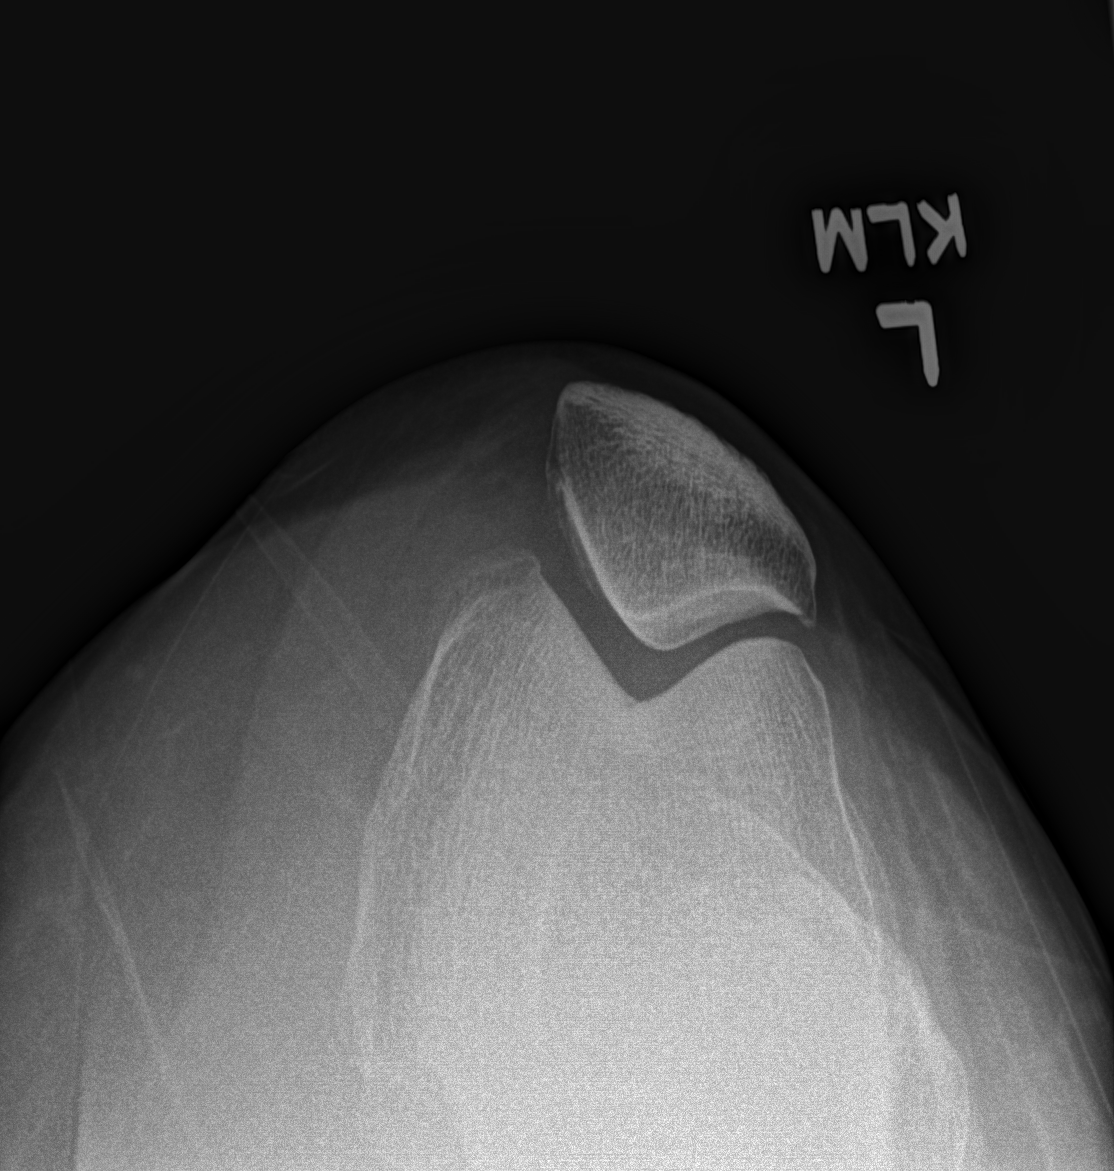

[4 of 4 positions shown; findings below may reference images not displayed]

FINDINGS: No evidence of fracture, dislocation, or joint effusion. No evidence
of arthropathy or other focal bone abnormality. Soft tissues are
unremarkable.
IMPRESSION: No acute abnormality noted.

## 2022-06-11 DIAGNOSIS — Z0289 Encounter for other administrative examinations: Secondary | ICD-10-CM

## 2022-06-18 ENCOUNTER — Telehealth (INDEPENDENT_AMBULATORY_CARE_PROVIDER_SITE_OTHER): Payer: PRIVATE HEALTH INSURANCE | Admitting: Registered Nurse

## 2022-06-18 DIAGNOSIS — R1013 Epigastric pain: Secondary | ICD-10-CM

## 2022-06-18 DIAGNOSIS — Z1211 Encounter for screening for malignant neoplasm of colon: Secondary | ICD-10-CM | POA: Diagnosis not present

## 2022-06-18 MED ORDER — PANTOPRAZOLE SODIUM 40 MG PO TBEC: 40.0000 mg | DELAYED_RELEASE_TABLET | Freq: Every day | ORAL | 3 refills | Status: DC

## 2022-06-18 NOTE — Progress Notes (Signed)
Telemedicine Encounter- SOAP NOTE Established Patient  This telephone encounter was conducted with the patient's (or proxy's) verbal consent via audio telecommunications: yes/no: Yes Patient was instructed to have this encounter in a suitably private space; and to only have persons present to whom they give permission to participate. In addition, patient identity was confirmed by use of name plus two identifiers (DOB and address).  I discussed the limitations, risks, security and privacy concerns of performing an evaluation and management service by telephone and the availability of in person appointments. I also discussed with the patient that there may be a patient responsible charge related to this service. The patient expressed understanding and agreed to proceed.  I spent a total of 16 minutes  talking with the patient or their proxy.  Patient at home Provider in office  Participants: Kathrin Ruddy, NP and Ignatius Specking  No chief complaint on file.   Subjective   Beth Frost is a 51 y.o. established patient. Telephone visit today for abd pain  HPI Onset late last week. Notes occ burning in epigastric area. Intermittent excess flatulence.  No diarrhea, constipation, vomiting Some nausea.  No weight changes 3x negative home pregnancy test.   Has not had colon ca screen  Patient Active Problem List   Diagnosis Date Noted   Obesity, Class II, BMI 35-39.9 02/12/2022   Depression    Snoring 02/09/2022   Fever, unspecified 06/01/2019   Malaise 06/01/2019   Asthma 05/13/2019   Hypertensive disorder 05/13/2019   Acute appendicitis 11/04/2017   Essential hypertension 11/26/2016    Class: Family History of   Herpes genitalis in women 07/18/2014    Class: History of    Past Medical History:  Diagnosis Date   Abnormal Pap smear    many yrs ago, 2020 LGSIL   Asthma    Depression    Fibroids    Hypertension    STD (sexually transmitted disease)    HSV2    Vaginal wall cyst     Current Outpatient Medications  Medication Sig Dispense Refill   pantoprazole (PROTONIX) 40 MG tablet Take 1 tablet (40 mg total) by mouth daily. 30 tablet 3   albuterol (VENTOLIN HFA) 108 (90 Base) MCG/ACT inhaler Inhale 2 puffs into the lungs every 6 (six) hours as needed for wheezing or shortness of breath. 18 g 5   amLODipine (NORVASC) 5 MG tablet Take 1 tablet (5 mg total) by mouth daily. 90 tablet 3   amoxicillin-clavulanate (AUGMENTIN) 500-125 MG tablet Take 1 tablet (500 mg total) by mouth 2 (two) times daily before a meal. 14 tablet 0   azelastine (ASTELIN) 0.1 % nasal spray Place 1 spray into both nostrils 2 (two) times daily. Use in each nostril as directed 30 mL 12   cetirizine (ZYRTEC) 10 MG tablet TAKE 1 TABLET(10 MG) BY MOUTH DAILY 30 tablet 11   citalopram (CELEXA) 20 MG tablet Take 1 tablet (20 mg total) by mouth daily. 90 tablet 3   cyclobenzaprine (FLEXERIL) 5 MG tablet Take 1 tablet (5 mg total) by mouth 3 (three) times daily as needed for muscle spasms. 30 tablet 11   hydrOXYzine (ATARAX) 25 MG tablet Take 1 tablet 3 times a day by oral route for 7 days.     ibuprofen (ADVIL,MOTRIN) 200 MG tablet Take 200 mg by mouth as needed.     Multiple Vitamins-Minerals (MULTIVITAMIN PO) Take 1 tablet by mouth daily as needed (supplemental).      norethindrone (MICRONOR)  0.35 MG tablet Take 1 tablet (0.35 mg total) by mouth daily. 84 tablet 4   phentermine 37.5 MG capsule Take 1 capsule (37.5 mg total) by mouth every morning. 90 capsule 0   triamcinolone cream (KENALOG) 0.1 % Apply 1 application topically 2 (two) times daily. 30 g 0   valACYclovir (VALTREX) 1000 MG tablet TAKE 1 TABLET BY MOUTH DAILY AT ONSET OF OUTBREAK 45 tablet 12   No current facility-administered medications for this visit.    Allergies  Allergen Reactions   Flagyl [Metronidazole] Hives    Itchy all over    Social History   Socioeconomic History   Marital status: Married    Spouse  name: Not on file   Number of children: Not on file   Years of education: Not on file   Highest education level: Not on file  Occupational History   Not on file  Tobacco Use   Smoking status: Never   Smokeless tobacco: Never  Vaping Use   Vaping Use: Never used  Substance and Sexual Activity   Alcohol use: Yes    Comment: 1 a day   Drug use: No   Sexual activity: Yes    Partners: Male    Birth control/protection: Pill  Other Topics Concern   Not on file  Social History Narrative   Not on file   Social Determinants of Health   Financial Resource Strain: Not on file  Food Insecurity: Not on file  Transportation Needs: Not on file  Physical Activity: Not on file  Stress: Not on file  Social Connections: Not on file  Intimate Partner Violence: Not on file    Review of Systems  Constitutional: Negative.   HENT: Negative.    Eyes: Negative.   Respiratory: Negative.    Cardiovascular: Negative.   Gastrointestinal:  Positive for abdominal pain, heartburn and nausea. Negative for blood in stool, constipation, diarrhea, melena and vomiting.  Genitourinary: Negative.   Musculoskeletal: Negative.   Skin: Negative.   Neurological: Negative.   Endo/Heme/Allergies: Negative.   Psychiatric/Behavioral: Negative.    All other systems reviewed and are negative.   Objective   Vitals as reported by the patient: There were no vitals filed for this visit.  Diagnoses and all orders for this visit:  Epigastric pain -     pantoprazole (PROTONIX) 40 MG tablet; Take 1 tablet (40 mg total) by mouth daily.  Screen for colon cancer -     Ambulatory referral to Gastroenterology    PLAN Unclear etiology, possibly GERD/gastritis. Will try PPI and check in in one week. Overdue for colon ca screen, refer to GI for colonoscopy and follow up if we cannot improve on current issue Given low fodmap eating guide. Patient encouraged to call clinic with any questions, comments, or  concerns.   I discussed the assessment and treatment plan with the patient. The patient was provided an opportunity to ask questions and all were answered. The patient agreed with the plan and demonstrated an understanding of the instructions.   The patient was advised to call back or seek an in-person evaluation if the symptoms worsen or if the condition fails to improve as anticipated.  I provided 17 minutes of non-face-to-face time during this encounter.  Maximiano Coss, NP

## 2022-06-18 NOTE — Patient Instructions (Signed)
Ms Shelbey Spindler to speak with you.  See below for FODMAP info.  I recommend these providers:  Inda Coke, PA Dimas Chyle, MD Berniece Pap, MD Myrna Blazer Early, NP Jeralyn Ruths, DNP  Thank you for letting me take part in your care,  Rich

## 2022-07-02 ENCOUNTER — Encounter (INDEPENDENT_AMBULATORY_CARE_PROVIDER_SITE_OTHER): Payer: Self-pay | Admitting: Bariatrics

## 2022-07-02 ENCOUNTER — Ambulatory Visit (INDEPENDENT_AMBULATORY_CARE_PROVIDER_SITE_OTHER): Payer: PRIVATE HEALTH INSURANCE | Admitting: Bariatrics

## 2022-07-02 VITALS — BP 126/77 | HR 80 | Temp 98.2°F | Ht 62.0 in | Wt 176.0 lb

## 2022-07-02 DIAGNOSIS — R5383 Other fatigue: Secondary | ICD-10-CM | POA: Diagnosis not present

## 2022-07-02 DIAGNOSIS — E559 Vitamin D deficiency, unspecified: Secondary | ICD-10-CM | POA: Insufficient documentation

## 2022-07-02 DIAGNOSIS — I10 Essential (primary) hypertension: Secondary | ICD-10-CM

## 2022-07-02 DIAGNOSIS — R0602 Shortness of breath: Secondary | ICD-10-CM

## 2022-07-02 DIAGNOSIS — K2100 Gastro-esophageal reflux disease with esophagitis, without bleeding: Secondary | ICD-10-CM | POA: Diagnosis not present

## 2022-07-02 DIAGNOSIS — R7309 Other abnormal glucose: Secondary | ICD-10-CM | POA: Diagnosis not present

## 2022-07-02 DIAGNOSIS — Z1331 Encounter for screening for depression: Secondary | ICD-10-CM

## 2022-07-02 DIAGNOSIS — Z0000 Encounter for general adult medical examination without abnormal findings: Secondary | ICD-10-CM

## 2022-07-02 DIAGNOSIS — E669 Obesity, unspecified: Secondary | ICD-10-CM

## 2022-07-02 DIAGNOSIS — Z6832 Body mass index (BMI) 32.0-32.9, adult: Secondary | ICD-10-CM

## 2022-07-03 LAB — HEMOGLOBIN A1C
Est. average glucose Bld gHb Est-mCnc: 103 mg/dL
Hgb A1c MFr Bld: 5.2 % (ref 4.8–5.6)

## 2022-07-03 LAB — COMPREHENSIVE METABOLIC PANEL
ALT: 10 IU/L (ref 0–32)
AST: 16 IU/L (ref 0–40)
Albumin/Globulin Ratio: 1.6 (ref 1.2–2.2)
Albumin: 4.5 g/dL (ref 3.9–4.9)
Alkaline Phosphatase: 75 IU/L (ref 44–121)
BUN/Creatinine Ratio: 9 (ref 9–23)
BUN: 5 mg/dL — ABNORMAL LOW (ref 6–24)
Bilirubin Total: 1 mg/dL (ref 0.0–1.2)
CO2: 22 mmol/L (ref 20–29)
Calcium: 9.5 mg/dL (ref 8.7–10.2)
Chloride: 97 mmol/L (ref 96–106)
Creatinine, Ser: 0.55 mg/dL — ABNORMAL LOW (ref 0.57–1.00)
Globulin, Total: 2.8 g/dL (ref 1.5–4.5)
Glucose: 82 mg/dL (ref 70–99)
Potassium: 4.2 mmol/L (ref 3.5–5.2)
Sodium: 138 mmol/L (ref 134–144)
Total Protein: 7.3 g/dL (ref 6.0–8.5)
eGFR: 112 mL/min/{1.73_m2} (ref >59)

## 2022-07-03 LAB — TSH+T4F+T3FREE
Free T4: 1.29 ng/dL (ref 0.82–1.77)
T3, Free: 2.8 pg/mL (ref 2.0–4.4)
TSH: 1.6 u[IU]/mL (ref 0.450–4.500)

## 2022-07-03 LAB — VITAMIN D 25 HYDROXY (VIT D DEFICIENCY, FRACTURES): Vit D, 25-Hydroxy: 30.4 ng/mL (ref 30.0–100.0)

## 2022-07-03 LAB — INSULIN, RANDOM: INSULIN: 8.2 u[IU]/mL (ref 2.6–24.9)

## 2022-07-14 ENCOUNTER — Encounter (INDEPENDENT_AMBULATORY_CARE_PROVIDER_SITE_OTHER): Payer: Self-pay | Admitting: Bariatrics

## 2022-07-14 NOTE — Progress Notes (Signed)
Chief Complaint:   OBESITY Beth Frost (MR# [id number]) is a 51 y.o. female who presents for evaluation and treatment of obesity and related comorbidities. Current BMI is Body mass index is 32.19 kg/m. Beth Frost has been struggling with her weight for many years and has been unsuccessful in either losing weight, maintaining weight loss, or reaching her healthy weight goal.  Beth Frost does like to cook.  She craves sweets and carbohydrates in the evening.  Beth Frost is currently in the action stage of change and ready to dedicate time achieving and maintaining a healthier weight. Beth Frost is interested in becoming our patient and working on intensive lifestyle modifications including (but not limited to) diet and exercise for weight loss.  Beth Frost's habits were reviewed today and are as follows: Her family eats meals together, her desired weight loss is 36 lbs, she has been heavy most of her life, she started gaining weight about 42, her heaviest weight ever was 200 pounds, she is a picky eater and doesn't like to eat healthier foods, she has significant food cravings issues, she snacks frequently in the evenings, she skips meals frequently, she is frequently drinking liquids with calories, she frequently makes poor food choices, she frequently eats larger portions than normal, and she struggles with emotional eating.  Depression Screen [place]'s Food and Mood (modified PHQ-9) score was 8.     07/02/2022    9:07 AM  Depression screen PHQ 2/9  Decreased Interest 1  Down, Depressed, Hopeless 1  PHQ - 2 Score 2  Altered sleeping 1  Tired, decreased energy 1  Change in appetite 1  Feeling bad or failure about yourself  1  Trouble concentrating 2  Moving slowly or fidgety/restless 0  Suicidal thoughts 0  PHQ-9 Score 8  Difficult doing work/chores Not difficult at all   Subjective:   1. Other fatigue Dail admits to daytime somnolence and admits to waking up still tired. Patient has  a history of symptoms of daytime fatigue and morning fatigue. Beth Frost generally gets 6 or 8 hours of sleep per night, and states that she has generally restful sleep. Snoring is present. Apneic episodes are not present. Epworth Sleepiness Score is 2.   2. SOB (shortness of breath) on exertion Beth Frost notes increasing shortness of breath with exercising and seems to be worsening over time with weight gain. She notes getting out of breath sooner with activity than she used to. This has not gotten worse recently. Beth Frost denies shortness of breath at rest or orthopnea.  3. Essential hypertension Beth Frost is taking amlodipine and her blood pressure is well controlled.   4. Health care maintenance Given obesity.   5. Vitamin D deficiency Beth Frost is not on Vitamin D currently. Her last vitamin D level was low at 23.90.  6. Elevated glucose Beth Frost has a paternal history of pre-diabetes. She is not on medications currently.   7. Gastroesophageal reflux disease with esophagitis, unspecified whether hemorrhage Beth Frost is taking pantoprazole, and she notes it is working.   Assessment/Plan:   1. Other fatigue Beth Frost does feel that her weight is causing her energy to be lower than it should be. Fatigue may be related to obesity, depression or many other causes. Labs will be ordered, and in the meanwhile, Beth Frost will focus on self care including making healthy food choices, increasing physical activity and focusing on stress reduction.  - EKG 12-Lead - TSH+T4F+T3Free  2. SOB (shortness of breath) on exertion Beth Frost does feel that she gets out  of breath more easily that she used to when she exercises. Beth Frost's shortness of breath appears to be obesity related and exercise induced. She has agreed to work on weight loss and gradually increase exercise to treat her exercise induced shortness of breath. Will continue to monitor closely.  - TSH+T4F+T3Free  3. Essential hypertension We will check  labs today. Beth Frost will continue her blood pressure medications as directed.   - Comprehensive metabolic panel  4. Health care maintenance We will check labs today. EKG and IC were done today and reviewed with the patient today.  - Comprehensive metabolic panel - Hemoglobin A1c - Insulin, random - TSH+T4F+T3Free - VITAMIN D 25 Hydroxy (Vit-D Deficiency, Fractures)  5. Vitamin D deficiency We will check labs today, and we will follow-up at [facility] visit.  - VITAMIN D 25 Hydroxy (Vit-D Deficiency, Fractures)  6. Elevated glucose We will check labs today, and we will follow-up at [facility] next visit.  - Hemoglobin A1c - Insulin, random  7. Gastroesophageal reflux disease with esophagitis, unspecified whether hemorrhage Beth Frost will continue her medications, and she will continue to avoid trigger foods.   8. Depression screening Beth Frost had a positive depression screening. Depression is commonly associated with obesity and often results in emotional eating behaviors. We will monitor this closely and work on CBT to help improve the non-hunger eating patterns. Referral to Psychology may be required if no improvement is seen as she continues in our clinic.  9. Obesity, Current BMI 32.3 Beth Frost is currently in the action stage of change and her goal is to continue with weight loss efforts. I recommend Beth Frost begin the structured treatment plan as follows:  She has agreed to the Category 1 Plan.  She will minimize skipping meals.  She will have a protein shake if she is missing a meal.  Exercise goals: No exercise has been prescribed at this time.   Behavioral modification strategies: increasing lean protein intake, decreasing simple carbohydrates, increasing vegetables, increasing water intake, decreasing eating out, no skipping meals, meal planning and cooking strategies, keeping healthy foods in the home, and planning for success.  She was informed of the importance of  frequent follow-up visits to maximize her success with intensive lifestyle modifications for her multiple health conditions. She was informed we would discuss her lab results at her next visit unless there is a critical issue that needs to be addressed sooner. Beth Frost agreed to keep her next visit at the agreed upon time to discuss these results.  Objective:   Blood pressure 126/77, pulse 80, temperature 98.2 F (36.8 C), height '5\' 2"'$  (1.575 m), weight 176 lb (79.8 kg), last menstrual period 06/22/2022, SpO2 99 %. Body mass index is 32.19 kg/m.  EKG: Normal sinus rhythm, rate 76 BPM.  Indirect Calorimeter completed today shows a VO2 of 191 and a REE of 1310.  Her calculated basal metabolic rate is 4270 thus her basal metabolic rate is worse than expected.  General: Cooperative, alert, well developed, in no acute distress. HEENT: Conjunctivae and lids unremarkable. Cardiovascular: Regular rhythm.  Lungs: Normal work of breathing. Neurologic: No focal deficits.   Lab Results  Component Value Date   CREATININE 0.55 (L) 07/02/2022   BUN 5 (L) 07/02/2022   NA 138 07/02/2022   K 4.2 07/02/2022   CL 97 07/02/2022   CO2 22 07/02/2022   Lab Results  Component Value Date   ALT 10 07/02/2022   AST 16 07/02/2022   ALKPHOS 75 07/02/2022   BILITOT 1.0  07/02/2022   Lab Results  Component Value Date   HGBA1C 5.2 07/02/2022   HGBA1C 5.4 08/14/2021   HGBA1C 5.3 02/19/2020   Lab Results  Component Value Date   INSULIN 8.2 07/02/2022   Lab Results  Component Value Date   TSH 1.600 07/02/2022   Lab Results  Component Value Date   CHOL 195 08/14/2021   HDL 75.40 08/14/2021   LDLCALC 108 (H) 08/14/2021   TRIG 56.0 08/14/2021   CHOLHDL 3 08/14/2021   Lab Results  Component Value Date   WBC 6.3 08/14/2021   HGB 12.6 08/14/2021   HCT 37.0 08/14/2021   MCV 84.0 08/14/2021   PLT 325.0 08/14/2021   No results found for: "IRON", "TIBC", "FERRITIN"  Attestation Statements:    Reviewed by clinician on day of visit: allergies, medications, problem list, medical history, surgical history, family history, social history, and previous encounter notes.   Wilhemena Durie, am acting as Location manager for [employer], DO.  I have reviewed the above documentation for accuracy and completeness, and I agree with the above. Jearld Lesch, DO

## 2022-07-16 ENCOUNTER — Encounter (INDEPENDENT_AMBULATORY_CARE_PROVIDER_SITE_OTHER): Payer: Self-pay | Admitting: Bariatrics

## 2022-07-16 ENCOUNTER — Ambulatory Visit (INDEPENDENT_AMBULATORY_CARE_PROVIDER_SITE_OTHER): Payer: PRIVATE HEALTH INSURANCE | Admitting: Bariatrics

## 2022-07-16 VITALS — BP 116/72 | HR 82 | Temp 97.9°F | Ht 62.0 in | Wt 173.0 lb

## 2022-07-16 DIAGNOSIS — R632 Polyphagia: Secondary | ICD-10-CM | POA: Diagnosis not present

## 2022-07-16 DIAGNOSIS — E559 Vitamin D deficiency, unspecified: Secondary | ICD-10-CM | POA: Diagnosis not present

## 2022-07-16 DIAGNOSIS — E669 Obesity, unspecified: Secondary | ICD-10-CM | POA: Diagnosis not present

## 2022-07-16 DIAGNOSIS — E661 Drug-induced obesity: Secondary | ICD-10-CM | POA: Insufficient documentation

## 2022-07-16 DIAGNOSIS — Z6831 Body mass index (BMI) 31.0-31.9, adult: Secondary | ICD-10-CM | POA: Diagnosis not present

## 2022-07-16 MED ORDER — PHENTERMINE HCL 15 MG PO CAPS: 15.0000 mg | ORAL_CAPSULE | Freq: Every day | ORAL | 0 refills | Status: DC

## 2022-07-21 ENCOUNTER — Encounter (INDEPENDENT_AMBULATORY_CARE_PROVIDER_SITE_OTHER): Payer: Self-pay | Admitting: Bariatrics

## 2022-07-21 NOTE — Progress Notes (Signed)
Chief Complaint:   OBESITY Beth Frost is here to discuss her progress with her obesity treatment plan along with follow-up of her obesity related diagnoses. Beth Frost is on the Category 1 Plan and states she is following her eating plan approximately 50% of the time. Beth Frost states she is has not been exercising.  Today's visit was #: 2 Starting weight: 176 lbs Starting date: 07/02/22 Today's weight: 173 lbs Today's date: 07/16/22 Total lbs lost to date: 3 Total lbs lost since last in-office visit: -3  Interim History: She is down 3 pounds since her last visit.  She finds the plan overwhelming.  She is doing more water and protein.  Subjective:   1. Vitamin D deficiency Taking vitamin D.  Vitamin D is 30.4.  2. Polyphagia More in the afternoon.  No contraindications to phentermine.  Assessment/Plan:   1. Vitamin D deficiency Continue vitamin D.  2. Polyphagia 1. New prescription. - phentermine 15 MG capsule; Take 1 capsule (15 mg total) by mouth daily with lunch.  Dispense: 30 capsule; Refill: 0 2.  Increase water. 3.  Smart fruit sheet.  3. Obesity, Current BMI 31.7 1.  Will adhere closely to the plan, 8295%. 2.  Reviewed labs 07/02/2022-CMP, vitamin D, A1c, insulin, and thyroid panel. 3.  Checked PDMP. 4.  Controlled substance agreement for phentermine signed.  Beth Frost is currently in the action stage of change. As such, her goal is to continue with weight loss efforts. She has agreed to the Category 1 Plan.   Exercise goals: No exercise has been prescribed at this time.  Behavioral modification strategies: increasing lean protein intake, decreasing simple carbohydrates, increasing vegetables, increasing water intake, decreasing eating out, no skipping meals, meal planning and cooking strategies, keeping healthy foods in the home, and planning for success.  Beth Frost has agreed to follow-up with our clinic in 2-3 weeks. She was informed of the importance of frequent  follow-up visits to maximize her success with intensive lifestyle modifications for her multiple health conditions.    Objective:   Blood pressure 116/72, pulse 82, temperature 97.9 F (36.6 C), height '5\' 2"'$  (1.575 m), weight 173 lb (78.5 kg), last menstrual period 06/22/2022, SpO2 99 %. Body mass index is 31.64 kg/m.  General: Cooperative, alert, well developed, in no acute distress. HEENT: Conjunctivae and lids unremarkable. Cardiovascular: Regular rhythm.  Lungs: Normal work of breathing. Neurologic: No focal deficits.   Lab Results  Component Value Date   CREATININE 0.55 (L) 07/02/2022   BUN 5 (L) 07/02/2022   NA 138 07/02/2022   K 4.2 07/02/2022   CL 97 07/02/2022   CO2 22 07/02/2022   Lab Results  Component Value Date   ALT 10 07/02/2022   AST 16 07/02/2022   ALKPHOS 75 07/02/2022   BILITOT 1.0 07/02/2022   Lab Results  Component Value Date   HGBA1C 5.2 07/02/2022   HGBA1C 5.4 08/14/2021   HGBA1C 5.3 02/19/2020   Lab Results  Component Value Date   INSULIN 8.2 07/02/2022   Lab Results  Component Value Date   TSH 1.600 07/02/2022   Lab Results  Component Value Date   CHOL 195 08/14/2021   HDL 75.40 08/14/2021   LDLCALC 108 (H) 08/14/2021   TRIG 56.0 08/14/2021   CHOLHDL 3 08/14/2021   Lab Results  Component Value Date   VD25OH 30.4 07/02/2022   VD25OH 23.90 (L) 12/18/2021   VD25OH 17.19 (L) 08/14/2021   Lab Results  Component Value Date   WBC 6.3  08/14/2021   HGB 12.6 08/14/2021   HCT 37.0 08/14/2021   MCV 84.0 08/14/2021   PLT 325.0 08/14/2021   No results found for: "IRON", "TIBC", "FERRITIN"   Attestation Statements:   Reviewed by clinician on day of visit: allergies, medications, problem list, medical history, surgical history, family history, social history, and previous encounter notes.  I, Dawn Whitmire, FNP-C, am acting as transcriptionist for Dr. Jearld Lesch.  I have reviewed the above documentation for accuracy and  completeness, and I agree with the above. Jearld Lesch, DO

## 2022-08-13 ENCOUNTER — Ambulatory Visit: Payer: PRIVATE HEALTH INSURANCE | Admitting: Bariatrics

## 2022-09-14 ENCOUNTER — Encounter: Payer: Self-pay | Admitting: *Deleted

## 2022-09-24 ENCOUNTER — Other Ambulatory Visit: Payer: Self-pay

## 2022-09-24 DIAGNOSIS — A6009 Herpesviral infection of other urogenital tract: Secondary | ICD-10-CM

## 2022-09-24 MED ORDER — VALACYCLOVIR HCL 1 G PO TABS: ORAL_TABLET | ORAL | 1 refills | Status: DC

## 2022-10-05 ENCOUNTER — Ambulatory Visit: Payer: PRIVATE HEALTH INSURANCE | Admitting: Obstetrics and Gynecology

## 2022-12-03 LAB — HM MAMMOGRAPHY

## 2022-12-07 ENCOUNTER — Encounter: Payer: Self-pay | Admitting: Family Medicine

## 2022-12-17 ENCOUNTER — Encounter: Payer: Self-pay | Admitting: Family Medicine

## 2022-12-18 ENCOUNTER — Ambulatory Visit
Admission: RE | Admit: 2022-12-18 | Discharge: 2022-12-18 | Disposition: A | Discharge status: Home / Self Care | Payer: No Typology Code available for payment source | Source: Ambulatory Visit | Attending: Internal Medicine | Admitting: Internal Medicine

## 2022-12-18 VITALS — BP 125/85 | HR 74 | Temp 98.4°F | Resp 16

## 2022-12-18 DIAGNOSIS — R059 Cough, unspecified: Secondary | ICD-10-CM | POA: Insufficient documentation

## 2022-12-18 DIAGNOSIS — J069 Acute upper respiratory infection, unspecified: Secondary | ICD-10-CM | POA: Diagnosis present

## 2022-12-18 DIAGNOSIS — Z1152 Encounter for screening for COVID-19: Secondary | ICD-10-CM | POA: Insufficient documentation

## 2022-12-18 DIAGNOSIS — J4520 Mild intermittent asthma, uncomplicated: Secondary | ICD-10-CM | POA: Diagnosis present

## 2022-12-18 LAB — SARS CORONAVIRUS 2 (TAT 6-24 HRS): SARS Coronavirus 2: NEGATIVE

## 2022-12-18 MED ORDER — ALBUTEROL SULFATE HFA 108 (90 BASE) MCG/ACT IN AERS: 2.0000 | INHALATION_SPRAY | Freq: Four times a day (QID) | RESPIRATORY_TRACT | 0 refills | Status: AC | PRN

## 2022-12-18 MED ORDER — BENZONATATE 100 MG PO CAPS: 100.0000 mg | ORAL_CAPSULE | Freq: Three times a day (TID) | ORAL | 0 refills | Status: DC | PRN

## 2022-12-18 MED ORDER — FLUTICASONE PROPIONATE 50 MCG/ACT NA SUSP: 1.0000 | Freq: Every day | NASAL | 0 refills | Status: DC

## 2022-12-18 NOTE — Discharge Instructions (Addendum)
COVID test is pending.  We will call if it is positive.  It appears that you have a viral illness that should run its course and self resolve with symptomatic treatment as we discussed.  I have refilled your albuterol inhaler and sent you a few other medications to help alleviate your symptoms.  Please follow-up if any symptoms persist or worsen.

## 2022-12-18 NOTE — ED Provider Notes (Signed)
[facility]    CSN: [id number] Arrival date & time: 12/18/22  0947      History   Chief Complaint Chief Complaint  Patient presents with   Headache    HPI Beth Frost is a 52 y.o. female.   Patient presents with headache, sore throat, nasal congestion, sinus pressure, mild nonproductive cough that started about 3 days ago.  Patient denies any known fevers at home.  Denies any known sick contacts.  Has taken TheraFlu and Sudafed with minimal improvement in symptoms.  Patient reports history of asthma but has not had albuterol inhaler at home.  She reports that she has been having intermittent wheezing but denies chest pain or shortness of breath.  Denies ear pain, nausea, vomiting, diarrhea, abdominal pain.   Headache   Past Medical History:  Diagnosis Date   Abnormal Pap smear    many yrs ago, 2020 LGSIL   Anxiety    Asthma    Depression    Fibroids    GERD (gastroesophageal reflux disease)    High blood pressure    Hypertension    Palpitations    STD (sexually transmitted disease)    HSV2   Vaginal wall cyst    Vitamin D deficiency     Patient Active Problem List   Diagnosis Date Noted   Polyphagia 07/16/2022   Class 1 obesity with serious comorbidity and body mass index (BMI) of 32.0 to 32.9 in adult 07/16/2022   Other fatigue 07/02/2022   SOB (shortness of breath) on exertion 07/02/2022   Health care maintenance 07/02/2022   Vitamin D deficiency 07/02/2022   Elevated glucose 07/02/2022   Gastroesophageal reflux disease with esophagitis 07/02/2022   Obesity, Class II, BMI 35-39.9 02/12/2022   Depression    Snoring 02/09/2022   Fever, unspecified 06/01/2019   Malaise 06/01/2019   Asthma 05/13/2019   Hypertensive disorder 05/13/2019   Acute appendicitis 11/04/2017   Essential hypertension 11/26/2016    Class: Family History of   Herpes genitalis in women 07/18/2014    Class: History of    Past Surgical History:  Procedure  Laterality Date   CHOLECYSTECTOMY N/A 05/11/2014   Procedure: LAPAROSCOPIC CHOLECYSTECTOMY WITH INTRAOPERATIVE CHOLANGIOGRAM;  Surgeon: Zenovia Jarred, MD;  Location: [place];  Service: General;  Laterality: N/A;   CHOLECYSTECTOMY  04/2014   COLPOSCOPY     CRYOTHERAPY     for abnormal pap   DILATION AND CURETTAGE OF UTERUS     LAPAROSCOPIC APPENDECTOMY N/A 11/05/2017   Procedure: APPENDECTOMY LAPAROSCOPIC;  Surgeon: Coralie Keens, MD;  Location: [place];  Service: General;  Laterality: N/A;    OB History     Gravida  4   Para  2   Term  2   Preterm      AB  2   Living  2      SAB  1   IAB  1   Ectopic      Multiple      Live Births  2            Home Medications    Prior to Admission medications   Medication Sig Start Date End Date Taking? Authorizing Provider  benzonatate (TESSALON) 100 MG capsule Take 1 capsule (100 mg total) by mouth every 8 (eight) hours as needed for cough. 12/18/22  Yes Apolonia Ellwood, Hildred Alamin E, FNP  fluticasone (FLONASE) 50 MCG/ACT nasal spray Place 1 spray into both nostrils daily. 12/18/22  Yes Teodora Medici, FNP  albuterol (VENTOLIN HFA) 108 (90 Base) MCG/ACT inhaler Inhale 2 puffs into the lungs every 6 (six) hours as needed for wheezing or shortness of breath. 12/18/22   Teodora Medici, FNP  amLODipine (NORVASC) 5 MG tablet Take 1 tablet (5 mg total) by mouth daily. 02/12/22   Maximiano Coss, NP  amoxicillin-clavulanate (AUGMENTIN) 500-125 MG tablet Take 1 tablet (500 mg total) by mouth 2 (two) times daily before a meal. 05/21/22   Chrzanowski, Jami B, NP  azelastine (ASTELIN) 0.1 % nasal spray Place 1 spray into both nostrils 2 (two) times daily. Use in each nostril as directed 09/11/20   Maximiano Coss, NP  cetirizine (ZYRTEC) 10 MG tablet TAKE 1 TABLET(10 MG) BY MOUTH DAILY 09/12/21   Maximiano Coss, NP  citalopram (CELEXA) 20 MG tablet Take 1 tablet (20 mg total) by mouth daily. 02/12/22   Maximiano Coss, NP  cyclobenzaprine (FLEXERIL) 5  MG tablet Take 1 tablet (5 mg total) by mouth 3 (three) times daily as needed for muscle spasms. 10/02/21   [name], MD  Ergocalciferol (VITAMIN D2 PO) Take by mouth.    [provider]  hydrOXYzine (ATARAX) 25 MG tablet Take 1 tablet 3 times a day by oral route for 7 days. 05/13/19   [provider]  ibuprofen (ADVIL,MOTRIN) 200 MG tablet Take 200 mg by mouth as needed.    [provider]  Multiple Vitamins-Minerals (MULTIVITAMIN PO) Take 1 tablet by mouth daily as needed (supplemental).     [provider]  norethindrone (MICRONOR) 0.35 MG tablet Take 1 tablet (0.35 mg total) by mouth daily. 02/09/22   Maximiano Coss, NP  pantoprazole (PROTONIX) 40 MG tablet Take 1 tablet (40 mg total) by mouth daily. 06/18/22   Maximiano Coss, NP  phentermine 15 MG capsule Take 1 capsule (15 mg total) by mouth daily with lunch. 07/16/22   Jearld Lesch A, DO  triamcinolone cream (KENALOG) 0.1 % Apply 1 application topically 2 (two) times daily. 08/14/21   Maximiano Coss, NP  valACYclovir (VALTREX) 1000 MG tablet TAKE 1 TABLET BY MOUTH DAILY AT ONSET OF OUTBREAK 09/24/22   Midge Minium, MD    Family History Family History  Problem Relation Age of Onset   Diabetes Mother    Cancer Mother        leukemia   Lupus Mother    Hypertension Mother    High Cholesterol Mother    Stroke Mother    Depression Mother    Anxiety disorder Mother    Hypertension Father    Diabetes Father    Alcohol abuse Father    Breast cancer Maternal Aunt     Social History Social History   Tobacco Use   Smoking status: Never   Smokeless tobacco: Never  Vaping Use   Vaping Use: Never used  Substance Use Topics   Alcohol use: Yes    Comment: 1 a day   Drug use: No     Allergies   Flagyl [metronidazole]   Review of Systems Review of Systems Per HPI  Physical Exam Triage Vital Signs ED Triage Vitals  Enc Vitals Group     BP 12/18/22 1002 125/85     Pulse  Rate 12/18/22 1002 74     Resp 12/18/22 1002 16     Temp 12/18/22 1002 98.4 F (36.9 C)     Temp Source 12/18/22 1002 Oral     SpO2 12/18/22 1002 97 %     Weight --  Height --      Head Circumference --      Peak Flow --      Pain Score 12/18/22 1001 7     Pain Loc --      Pain Edu? --      Excl. in [place]? --    No data found.  Updated Vital Signs BP 125/85 (BP Location: Left Arm)   Pulse 74   Temp 98.4 F (36.9 C) (Oral)   Resp 16   LMP 11/30/2022 (Approximate)   SpO2 97%   Visual Acuity Right Eye Distance:   Left Eye Distance:   Bilateral Distance:    Right Eye Near:   Left Eye Near:    Bilateral Near:     Physical Exam Constitutional:      General: She is not in acute distress.    Appearance: Normal appearance. She is not toxic-appearing or diaphoretic.  HENT:     Head: Normocephalic and atraumatic.     Right Ear: Tympanic membrane and ear canal normal.     Left Ear: Tympanic membrane and ear canal normal.     Nose: Congestion present.     Right Sinus: No maxillary sinus tenderness or frontal sinus tenderness.     Left Sinus: No maxillary sinus tenderness or frontal sinus tenderness.     Mouth/Throat:     Mouth: Mucous membranes are moist.     Pharynx: No posterior oropharyngeal erythema.  Eyes:     Extraocular Movements: Extraocular movements intact.     Conjunctiva/sclera: Conjunctivae normal.     Pupils: Pupils are equal, round, and reactive to light.  Cardiovascular:     Rate and Rhythm: Normal rate and regular rhythm.     Pulses: Normal pulses.     Heart sounds: Normal heart sounds.  Pulmonary:     Effort: Pulmonary effort is normal. No respiratory distress.     Breath sounds: Normal breath sounds. No stridor. No wheezing, rhonchi or rales.  Abdominal:     General: Abdomen is flat. Bowel sounds are normal.     Palpations: Abdomen is soft.  Musculoskeletal:        General: Normal range of motion.     Cervical back: Normal range of motion.   Skin:    General: Skin is warm and dry.  Neurological:     General: No focal deficit present.     Mental Status: She is alert and oriented to person, place, and time. Mental status is at baseline.  Psychiatric:        Mood and Affect: Mood normal.        Behavior: Behavior normal.      UC Treatments / Results  Labs (all labs ordered are listed, but only abnormal results are displayed) Labs Reviewed  SARS CORONAVIRUS 2 (TAT 6-24 HRS)    EKG   Radiology No results found.  Procedures Procedures (including critical care time)  Medications Ordered in UC Medications - No data to display  Initial Impression / Assessment and Plan / UC Course  I have reviewed the triage vital signs and the nursing notes.  Pertinent labs & imaging results that were available during my care of the patient were reviewed by me and considered in my medical decision making (see chart for details).     Patient presents with symptoms likely from a viral upper respiratory infection. Do not suspect underlying cardiopulmonary process. Symptoms seem unlikely related to ACS, CHF or COPD exacerbations, pneumonia, pneumothorax. Patient is nontoxic  appearing and not in need of emergent medical intervention.  COVID test pending.  Recommended symptom control with medications and supportive care.  Patient sent prescriptions.  Albuterol inhaler refilled at previously prescribed dose given patient does not have inhaler at home and has been having intermittent wheezing.  Do not think prednisone steroid therapy is necessary at this time as there is low suspicion for asthma exacerbation.  Return if symptoms fail to improve in 1-2 weeks or you develop shortness of breath, chest pain, severe headache. Patient states understanding and is agreeable.  Discharged with PCP followup.  Final Clinical Impressions(s) / UC Diagnoses   Final diagnoses:  Viral upper respiratory tract infection with cough     Discharge  Instructions      COVID test is pending.  We will call if it is positive.  It appears that you have a viral illness that should run its course and self resolve with symptomatic treatment as we discussed.  I have refilled your albuterol inhaler and sent you a few other medications to help alleviate your symptoms.  Please follow-up if any symptoms persist or worsen.    ED Prescriptions     Medication Sig Dispense Auth. Provider   albuterol (VENTOLIN HFA) 108 (90 Base) MCG/ACT inhaler Inhale 2 puffs into the lungs every 6 (six) hours as needed for wheezing or shortness of breath. 18 g Ader Fritze, Hildred Alamin E, [place]   fluticasone [facility]) 50 MCG/ACT nasal spray Place 1 spray into both nostrils daily. 16 g Tykeria Wawrzyniak, Hildred Alamin E, [place]   benzonatate (TESSALON) 100 MG capsule Take 1 capsule (100 mg total) by mouth every 8 (eight) hours as needed for cough. 21 capsule Ang[place], Michele Rockers, [place]      PDMP not reviewed this encounter.   Teodora Medici, [place] 12/18/22 1100

## 2022-12-18 NOTE — ED Triage Notes (Signed)
Patient presents to UC for HA, sore throat, and facial pain x 3 days. Concerned with sinus infection. Taking theraflu and sudafed.

## 2022-12-31 ENCOUNTER — Ambulatory Visit: Payer: PRIVATE HEALTH INSURANCE | Admitting: Family Medicine

## 2022-12-31 ENCOUNTER — Encounter: Payer: Self-pay | Admitting: Family Medicine

## 2022-12-31 VITALS — BP 122/80 | HR 75 | Temp 97.6°F | Ht 62.0 in | Wt 190.8 lb

## 2022-12-31 DIAGNOSIS — E559 Vitamin D deficiency, unspecified: Secondary | ICD-10-CM | POA: Diagnosis not present

## 2022-12-31 DIAGNOSIS — Z6834 Body mass index (BMI) 34.0-34.9, adult: Secondary | ICD-10-CM

## 2022-12-31 DIAGNOSIS — L309 Dermatitis, unspecified: Secondary | ICD-10-CM

## 2022-12-31 DIAGNOSIS — F5105 Insomnia due to other mental disorder: Secondary | ICD-10-CM

## 2022-12-31 DIAGNOSIS — F32A Depression, unspecified: Secondary | ICD-10-CM

## 2022-12-31 DIAGNOSIS — R21 Rash and other nonspecific skin eruption: Secondary | ICD-10-CM | POA: Diagnosis not present

## 2022-12-31 DIAGNOSIS — R5383 Other fatigue: Secondary | ICD-10-CM

## 2022-12-31 DIAGNOSIS — E661 Drug-induced obesity: Secondary | ICD-10-CM | POA: Diagnosis not present

## 2022-12-31 DIAGNOSIS — F99 Mental disorder, not otherwise specified: Secondary | ICD-10-CM

## 2022-12-31 DIAGNOSIS — J302 Other seasonal allergic rhinitis: Secondary | ICD-10-CM

## 2022-12-31 DIAGNOSIS — I10 Essential (primary) hypertension: Secondary | ICD-10-CM | POA: Diagnosis not present

## 2022-12-31 DIAGNOSIS — K2100 Gastro-esophageal reflux disease with esophagitis, without bleeding: Secondary | ICD-10-CM

## 2022-12-31 LAB — COMPREHENSIVE METABOLIC PANEL
ALT: 14 U/L (ref 0–35)
AST: 21 U/L (ref 0–37)
Albumin: 4.1 g/dL (ref 3.5–5.2)
Alkaline Phosphatase: 63 U/L (ref 39–117)
BUN: 7 mg/dL (ref 6–23)
CO2: 26 mEq/L (ref 19–32)
Calcium: 9.1 mg/dL (ref 8.4–10.5)
Chloride: 102 mEq/L (ref 96–112)
Creatinine, Ser: 0.52 mg/dL (ref 0.40–1.20)
GFR: 108.99 mL/min (ref >60.00)
Glucose, Bld: 74 mg/dL (ref 70–99)
Potassium: 4.2 mEq/L (ref 3.5–5.1)
Sodium: 138 mEq/L (ref 135–145)
Total Bilirubin: 1.3 mg/dL — ABNORMAL HIGH (ref 0.2–1.2)
Total Protein: 7.3 g/dL (ref 6.0–8.3)

## 2022-12-31 LAB — VITAMIN D 25 HYDROXY (VIT D DEFICIENCY, FRACTURES): VITD: 17.16 ng/mL — ABNORMAL LOW (ref 30.00–100.00)

## 2022-12-31 LAB — VITAMIN B12: Vitamin B-12: 276 pg/mL (ref 211–911)

## 2022-12-31 LAB — TSH: TSH: 1.85 u[IU]/mL (ref 0.35–5.50)

## 2022-12-31 MED ORDER — CITALOPRAM HYDROBROMIDE 40 MG PO TABS: 40.0000 mg | ORAL_TABLET | Freq: Every day | ORAL | 3 refills | Status: DC

## 2022-12-31 MED ORDER — TRIAMCINOLONE ACETONIDE 0.1 % EX CREA: 1.0000 | TOPICAL_CREAM | Freq: Two times a day (BID) | CUTANEOUS | 0 refills | Status: AC

## 2022-12-31 MED ORDER — HYDROXYZINE PAMOATE 25 MG PO CAPS: 25.0000 mg | ORAL_CAPSULE | Freq: Every evening | ORAL | 0 refills | Status: DC | PRN

## 2022-12-31 NOTE — Progress Notes (Signed)
New Patient Office Visit  Subjective    Patient ID: Beth Frost, female    DOB: [birth year]  Age: 52 y.o. MRN: [id number]  CC:  Chief Complaint  Patient presents with   Establish Care   Fatigue    Thinks she may be perimenopausal. C/o fatigue, hot flashes and sweating at night, moody.   Insomnia    Patient has trouble sleeping and wants to know if Di Kindle can prescribe her something to help. She used to take hydroxyzine which helped a little but it was only a 7 day rx.   Weight Loss    Patient was taking phentermine but she is now out of it and wants to know if Di Kindle can rx more    HPI QUINTAVIA PALEO presents to establish care new provider.   Depression: Patient takes Celexa 24m for depression. She reports she is uncertain if she needs to go up on the dose or if there is another reason for some of her symptoms. She has been experiencing no energy, fatigued, been more moody, experiences hot flashes, crying more, and more easily stressed. She reports she did lose a brother recently. Denies SI and HI. She reports she has some hard days and some good days.   Insomnia: Patient reports she has previously took Hydroxyzine 159m thinks she took 2 tablets with relief. She has a problem with going to sleep and staying a sleep. She reports when she lays down, her mind feels like it will not stop. She typically goes to bed at 9am and wakes up around 5:30 am. She has also tried Melentonin either 54m48mr 5 mg tablets with no relief.   HTN: Patient is on Norvasc 5mg28mily with a controlled pressure in office today. She usually checks blood pressure at work at least once a week with good readings. She denies chest pain, SHOB, lightheadedness, or headaches. Reports dizziness intermittent, once in awhile when waking up. Denies lower extremity edema.   Allergies: Patient is taking Zyrtec and Flonase with no complications.  GERD: Patient is taking Protonix with no concerns or  complications.  Weight loss: Patient is requesting to start back Phentermine for weight loss. She has tried this medication with previous PCP, RichMaximiano Coss  with success. Also, [facility] [place] was prescribed 15mg27me reports she is unable to follow up with weight loss because of insurance coverage. She is currently not excercising. She describes her eating habits as sometimes eating really good,other times eat a lot of muffins, sweets, and breads.    Outpatient Encounter Medications as of 12/31/2022  Medication Sig   albuterol (VENTOLIN HFA) 108 (90 Base) MCG/ACT inhaler Inhale 2 puffs into the lungs every 6 (six) hours as needed for wheezing or shortness of breath.   amLODipine (NORVASC) 5 MG tablet Take 1 tablet (5 mg total) by mouth daily.   azelastine (ASTELIN) 0.1 % nasal spray Place 1 spray into both nostrils 2 (two) times daily. Use in each nostril as directed   cetirizine (ZYRTEC) 10 MG tablet TAKE 1 TABLET(10 MG) BY MOUTH DAILY   citalopram (CELEXA) 20 MG tablet Take 1 tablet (20 mg total) by mouth daily.   cyclobenzaprine (FLEXERIL) 5 MG tablet Take 1 tablet (5 mg total) by mouth 3 (three) times daily as needed for muscle spasms.   fluticasone (FLONASE) 50 MCG/ACT nasal spray Place 1 spray into both nostrils daily.   ibuprofen (ADVIL,MOTRIN) 200 MG tablet Take 200  mg by mouth as needed.   Multiple Vitamins-Minerals (MULTIVITAMIN PO) Take 1 tablet by mouth daily as needed (supplemental).    norethindrone (MICRONOR) 0.35 MG tablet Take 1 tablet (0.35 mg total) by mouth daily.   pantoprazole (PROTONIX) 40 MG tablet Take 1 tablet (40 mg total) by mouth daily.   valACYclovir (VALTREX) 1000 MG tablet TAKE 1 TABLET BY MOUTH DAILY AT ONSET OF OUTBREAK   [DISCONTINUED] Ergocalciferol (VITAMIN D2 PO) Take by mouth.   phentermine 15 MG capsule Take 1 capsule (15 mg total) by mouth daily with lunch. (Patient not taking: Reported on 12/31/2022)    triamcinolone cream (KENALOG) 0.1 % Apply 1 application topically 2 (two) times daily. (Patient not taking: Reported on 12/31/2022)   [DISCONTINUED] amoxicillin-clavulanate (AUGMENTIN) 500-125 MG tablet Take 1 tablet (500 mg total) by mouth 2 (two) times daily before a meal. (Patient not taking: Reported on 12/31/2022)   [DISCONTINUED] benzonatate (TESSALON) 100 MG capsule Take 1 capsule (100 mg total) by mouth every 8 (eight) hours as needed for cough.   [DISCONTINUED] hydrOXYzine (ATARAX) 25 MG tablet Take 1 tablet 3 times a day by oral route for 7 days.   No facility-administered encounter medications on file as of 12/31/2022.    Past Medical History:  Diagnosis Date   Abnormal Pap smear    many yrs ago, 2020 LGSIL   Anxiety    Asthma    Depression    Fibroids    GERD (gastroesophageal reflux disease)    High blood pressure    Hypertension    Palpitations    STD (sexually transmitted disease)    HSV2   Vaginal wall cyst    Vitamin D deficiency     Past Surgical History:  Procedure Laterality Date   CHOLECYSTECTOMY N/A 05/11/2014   Procedure: LAPAROSCOPIC CHOLECYSTECTOMY WITH INTRAOPERATIVE CHOLANGIOGRAM;  Surgeon: Zenovia Jarred, MD;  Location: [place];  Service: General;  Laterality: N/A;   CHOLECYSTECTOMY  04/2014   COLPOSCOPY     CRYOTHERAPY     for abnormal pap   DILATION AND CURETTAGE OF UTERUS     LAPAROSCOPIC APPENDECTOMY N/A 11/05/2017   Procedure: APPENDECTOMY LAPAROSCOPIC;  Surgeon: Coralie Keens, MD;  Location: [place];  Service: General;  Laterality: N/A;    Family History  Problem Relation Age of Onset   Diabetes Mother    Cancer Mother        leukemia   Lupus Mother    Hypertension Mother    High Cholesterol Mother    Stroke Mother    Depression Mother    Anxiety disorder Mother    Hypertension Father    Diabetes Father    Alcohol abuse Father    Breast cancer Maternal Aunt     Social History   Socioeconomic History   Marital status: Married     Spouse name: Not on file   Number of children: Not on file   Years of education: Not on file   Highest education level: Not on file  Occupational History   Not on file  Tobacco Use   Smoking status: Never   Smokeless tobacco: Never  Vaping Use   Vaping Use: Never used  Substance and Sexual Activity   Alcohol use: Yes    Comment: 1 a day   Drug use: No   Sexual activity: Yes    Partners: Male    Birth control/protection: Pill  Other Topics Concern   Not on file  Social History Narrative   Not on  file   Social Determinants of Health   Financial Resource Strain: Not on file  Food Insecurity: Not on file  Transportation Needs: Not on file  Physical Activity: Not on file  Stress: Not on file  Social Connections: Not on file  Intimate Partner Violence: Not on file    ROS See HPI above      Objective    BP 122/80   Pulse 75   Temp 97.6 F (36.4 C)   Ht 5' 2"$  (1.575 m)   Wt 190 lb 12.8 oz (86.5 kg)   LMP 11/30/2022 (Approximate)   SpO2 100%   BMI 34.90 kg/m   Physical Exam Vitals reviewed.  Constitutional:      General: She is not in acute distress.    Appearance: Normal appearance. She is not ill-appearing or toxic-appearing.  Eyes:     General:        Right eye: No discharge.        Left eye: No discharge.     Conjunctiva/sclera: Conjunctivae normal.  Cardiovascular:     Rate and Rhythm: Normal rate and regular rhythm.     Heart sounds: Normal heart sounds. No murmur heard.    No friction rub. No gallop.  Pulmonary:     Effort: Pulmonary effort is normal. No respiratory distress.     Breath sounds: Normal breath sounds.  Skin:    General: Skin is warm and dry.  Neurological:     General: No focal deficit present.     Mental Status: She is alert. Mental status is at baseline.  Psychiatric:        Mood and Affect: Mood normal.        Behavior: Behavior normal.        Thought Content: Thought content normal.        Judgment: Judgment normal.          Assessment & Plan:   There are no diagnoses linked to this encounter.  No follow-ups on file.   Valarie Merino, NP

## 2023-01-01 ENCOUNTER — Other Ambulatory Visit: Payer: Self-pay

## 2023-01-01 ENCOUNTER — Telehealth: Payer: Self-pay

## 2023-01-01 DIAGNOSIS — J302 Other seasonal allergic rhinitis: Secondary | ICD-10-CM | POA: Insufficient documentation

## 2023-01-01 DIAGNOSIS — F5105 Insomnia due to other mental disorder: Secondary | ICD-10-CM | POA: Insufficient documentation

## 2023-01-01 DIAGNOSIS — A6009 Herpesviral infection of other urogenital tract: Secondary | ICD-10-CM

## 2023-01-01 DIAGNOSIS — R1013 Epigastric pain: Secondary | ICD-10-CM

## 2023-01-01 MED ORDER — VITAMIN D (ERGOCALCIFEROL) 1.25 MG (50000 UNIT) PO CAPS: 50000.0000 [IU] | ORAL_CAPSULE | ORAL | 0 refills | Status: DC

## 2023-01-01 MED ORDER — VALACYCLOVIR HCL 1 G PO TABS: ORAL_TABLET | ORAL | 1 refills | Status: DC

## 2023-01-01 MED ORDER — PANTOPRAZOLE SODIUM 40 MG PO TBEC: 40.0000 mg | DELAYED_RELEASE_TABLET | Freq: Every day | ORAL | 3 refills | Status: DC

## 2023-01-01 MED ORDER — PHENTERMINE HCL 37.5 MG PO CAPS: 37.5000 mg | ORAL_CAPSULE | ORAL | 0 refills | Status: DC

## 2023-01-01 NOTE — Telephone Encounter (Signed)
-----   Message from Evangeline Gula, NP sent at 01/01/2023  8:03 AM EST ----- Vitamin D is significantly low- recommend Vitamin D3 50,000 IU weekly x 12, then recheck Vitamin D in 3 months. Vitamin D is essential for immune support, heart disease, mood, and energy. Bilirubin is slightly elevated, never been elevated before and no other liver enzymes are abnormal. Will recheck CMP at next appointment. Vitamin B12 is on the low normal side, if she would like, she can take OTC Vitamin B12 supplement of 1,076mg. TSH is WNL.

## 2023-01-01 NOTE — Assessment & Plan Note (Signed)
Prescribed Hydroxyzine 25 mg to see if this would help with her insomnia and calm her mind down enough at bedtime. It has been effective in the past. Discussed about steps to help with a good nights rest, such as being in a dark, cool room and discontinuing use of all electronics before bed.

## 2023-01-01 NOTE — Assessment & Plan Note (Signed)
Continue to take Zyrtec and Flonase for seasonal allergies.

## 2023-01-01 NOTE — Assessment & Plan Note (Signed)
Continue with Protonix since she has no concerns or complications.

## 2023-01-01 NOTE — Assessment & Plan Note (Signed)
Ordering TSH, Vitamin B12, and Vitamin D for a reason of fatigue. Office will call with lab results and treat accordingly. Also, fatigue could be a result from not sleeping as well. Increasing Celexa to see if this will help with depression and prescribing Hydroxyzine for insomnia.

## 2023-01-01 NOTE — Assessment & Plan Note (Signed)
EKG ordered and reviewed. No concerns. Prescribed Phentermine 37.5 mg, 1 tablet daily. Discussed adverse reactions of medication and risk of taking medication. Informed if she developed chest pain or palpitations, stop medication immediately. Informed patient that this medication would only be prescribed for 3 months only. Encouraged diet modifications and regular exercise.

## 2023-01-01 NOTE — Assessment & Plan Note (Signed)
Ordered vitamin D since she has had a deficiency and having symptoms of fatigue and mood changes. Office will call with lab results and treat accordingly.

## 2023-01-01 NOTE — Patient Instructions (Signed)
It was a pleasure to meet you and I look forward to taking care of you. Ordered labs for reason of symptoms. Office will call with results and treat accordingly. Increase Celexa to 52m from 270m  Start Hydroxyzine 2579mor insomnia.  Start Phentermine 37.5 mg for weight loss. Recommend regular exercise and healthy diet. Continue all other prescribed medications. Take care and see you in a month.

## 2023-01-01 NOTE — Telephone Encounter (Signed)
Pt notes she did not receive the phentermine and would like this to the [facility] on [address] [place] other meds have been sent on her behalf as well as Vit D Rx

## 2023-01-01 NOTE — Assessment & Plan Note (Signed)
Increase Celexa to 67m daily from 256mto see if this will help with some of her symptoms. Patient reports she has enough 20 mg tablets until her next appointment to double up to make 4026m

## 2023-01-01 NOTE — Assessment & Plan Note (Signed)
Blood pressure is stable. Continue with Norvasc. Ordered CMP to assess her kidney function with having HTN.

## 2023-02-03 ENCOUNTER — Encounter: Payer: Self-pay | Admitting: Family Medicine

## 2023-02-08 ENCOUNTER — Other Ambulatory Visit: Payer: Self-pay

## 2023-02-08 DIAGNOSIS — I10 Essential (primary) hypertension: Secondary | ICD-10-CM

## 2023-02-08 MED ORDER — AMLODIPINE BESYLATE 5 MG PO TABS: 5.0000 mg | ORAL_TABLET | Freq: Every day | ORAL | 3 refills | Status: DC

## 2023-02-09 ENCOUNTER — Other Ambulatory Visit: Payer: Self-pay

## 2023-02-09 DIAGNOSIS — I10 Essential (primary) hypertension: Secondary | ICD-10-CM

## 2023-02-11 ENCOUNTER — Ambulatory Visit: Payer: No Typology Code available for payment source | Admitting: Family Medicine

## 2023-02-12 ENCOUNTER — Ambulatory Visit (INDEPENDENT_AMBULATORY_CARE_PROVIDER_SITE_OTHER): Payer: No Typology Code available for payment source | Admitting: Family Medicine

## 2023-02-12 ENCOUNTER — Encounter: Payer: Self-pay | Admitting: Family Medicine

## 2023-02-12 VITALS — BP 110/68 | HR 85 | Temp 98.3°F | Ht 62.0 in | Wt 191.5 lb

## 2023-02-12 DIAGNOSIS — F5105 Insomnia due to other mental disorder: Secondary | ICD-10-CM

## 2023-02-12 DIAGNOSIS — F32A Depression, unspecified: Secondary | ICD-10-CM | POA: Diagnosis not present

## 2023-02-12 DIAGNOSIS — A6009 Herpesviral infection of other urogenital tract: Secondary | ICD-10-CM

## 2023-02-12 DIAGNOSIS — E669 Obesity, unspecified: Secondary | ICD-10-CM | POA: Diagnosis not present

## 2023-02-12 DIAGNOSIS — Z1159 Encounter for screening for other viral diseases: Secondary | ICD-10-CM

## 2023-02-12 DIAGNOSIS — E66812 Obesity, class 2: Secondary | ICD-10-CM

## 2023-02-12 DIAGNOSIS — R7989 Other specified abnormal findings of blood chemistry: Secondary | ICD-10-CM

## 2023-02-12 DIAGNOSIS — F99 Mental disorder, not otherwise specified: Secondary | ICD-10-CM

## 2023-02-12 DIAGNOSIS — Z1211 Encounter for screening for malignant neoplasm of colon: Secondary | ICD-10-CM

## 2023-02-12 DIAGNOSIS — Z09 Encounter for follow-up examination after completed treatment for conditions other than malignant neoplasm: Secondary | ICD-10-CM

## 2023-02-12 MED ORDER — VALACYCLOVIR HCL 1 G PO TABS: ORAL_TABLET | ORAL | 1 refills | Status: DC

## 2023-02-12 MED ORDER — HYDROXYZINE PAMOATE 25 MG PO CAPS: 25.0000 mg | ORAL_CAPSULE | Freq: Every evening | ORAL | 2 refills | Status: DC | PRN

## 2023-02-12 MED ORDER — CITALOPRAM HYDROBROMIDE 20 MG PO TABS: 30.0000 mg | ORAL_TABLET | Freq: Every day | ORAL | 2 refills | Status: DC

## 2023-02-12 NOTE — Progress Notes (Signed)
Established Patient Office Visit   Subjective:  Patient ID: Beth Frost, female    DOB: 04-17-1971  Age: 52 y.o. MRN: [id number]  Chief Complaint  Patient presents with   Follow-up    Pt is here today for F/U on Insomnia . Pt is currently taking hydrOXYzine. Pt reports this has helped with sleep. Pt reports she has not been taking her Phentermine 37.5 like she is suppose to. Pt would like to discuss labs.    HPI Insomnia: Improving. At previous appointment, patient was prescribed Hydroxyzine 25mg  at bedtime. She reports this has helped with sleeping about 6 hours. She would like to continue medication.    Weight loss:Not improved. At previous appointment, patient was prescribed Phentermine 37.5mg  daily. She reports she thinks throughout the last month, she probably has only took 1 week worth of medication. She reports she has been stressed with her son and not focusing on her own health. She reports she has not been exercising and eating healthy.   Wt Readings from Last 3 Encounters:  02/12/23 191 lb 8 oz (86.9 kg)  12/31/22 190 lb 12.8 oz (86.5 kg)  07/16/22 173 lb (78.5 kg)    Depression: Chronic. On previous appointment, Celexa was increased to 40mg  from 20mg  daily. Patient reports she started taking 2 tablets of 20mg  to equal 40mg . She reports she felt like she didn't have any emotion, felt too relax, like she didn't care. Then, she decided to half a 20mg  with taking a whole 20mg  tablet to equal 30mg . She reports this has helped. She reports she feels more stable and depression controlled.   ROS See HPI above     Objective:    BP 110/68   Pulse 85   Temp 98.3 F (36.8 C)   Ht 5\' 2"  (1.575 m)   Wt 191 lb 8 oz (86.9 kg)   SpO2 99%   BMI 35.03 kg/m    Physical Exam Vitals reviewed.  Constitutional:      General: She is not in acute distress.    Appearance: Normal appearance. She is obese. She is not ill-appearing, toxic-appearing or diaphoretic.  Eyes:      General:        Right eye: No discharge.        Left eye: No discharge.     Conjunctiva/sclera: Conjunctivae normal.  Cardiovascular:     Rate and Rhythm: Normal rate and regular rhythm.     Heart sounds: Normal heart sounds. No murmur heard.    No friction rub. No gallop.  Pulmonary:     Effort: Pulmonary effort is normal. No respiratory distress.     Breath sounds: Normal breath sounds.  Musculoskeletal:        General: Normal range of motion.  Skin:    General: Skin is warm and dry.  Neurological:     General: No focal deficit present.     Mental Status: She is alert and oriented to person, place, and time. Mental status is at baseline.  Psychiatric:        Mood and Affect: Mood normal.        Behavior: Behavior normal.        Thought Content: Thought content normal.        Judgment: Judgment normal.      Assessment & Plan:  Insomnia due to other mental disorder Assessment & Plan: Continue Hydroxyzine since it is effective. Refilled medication.   Orders: -     hydrOXYzine Pamoate; Take  1 capsule (25 mg total) by mouth at bedtime as needed.  Dispense: 30 capsule; Refill: 2  Obesity, Class II, BMI 35-39.9 Assessment & Plan: She has gained a pound since last visit. Encouraged patient to take Phentermine daily to help with weight loss. Encouraged to start exercising and working on diet modifications for weight loss. Refill not needed at this time because she has not completed previous prescription. Informed patient to call when she needs a refill. Only can get 2 refills. Follow up in 1 month to assess weight loss.    Depression, unspecified depression type Assessment & Plan: Discontinue 40mg  daily. Continue with Celexa 30mg . Refilled Celexa 20mg  tablets (1.5mg ) daily.    Orders: -     Citalopram Hydrobromide; Take 1.5 tablets (30 mg total) by mouth daily.  Dispense: 45 tablet; Refill: 2  Colon cancer screening -     Ambulatory referral to Gastroenterology  Abnormal  liver function test -     Hepatic function panel  Encounter for hepatitis C screening test for low risk patient -     Hepatitis C antibody  Herpes genitalis in women Assessment & Plan: Refilled Valacyclovir.    Orders: -     valACYclovir HCl; TAKE 1 TABLET BY MOUTH DAILY AT ONSET OF OUTBREAK  Dispense: 45 tablet; Refill: 1  Follow-up exam  1.Reviewed health maintenance:  -Ordered referral to GI for colonoscopy.  -Ordered Hep C screening.  2.Review labs from previous visit. Answered questions. Ordered hepatic function test for elevated bilirubin.   Return in about 1 month (around 03/15/2023) for follow-up.   Valarie Merino, NP

## 2023-02-12 NOTE — Patient Instructions (Addendum)
-  Ordered labs to recheck liver function and screening for Hep C. -DECREASE Celexa to 30mg  daily (1.5 tablet of 20mg  tablet) -Refilled Hydroxyzine for insomnia.  -Refilled Valtrex  -Placed a referral to GI for colonoscopy. If not heard about appointment in 2 weeks, please call back to office. -Start back taking Adipex for weight loss. Recommend to start exercising and working on diet modifications for weight loss.  -Follow up in 1 month.

## 2023-02-12 NOTE — Assessment & Plan Note (Signed)
Refilled Valacyclovir

## 2023-02-12 NOTE — Assessment & Plan Note (Signed)
She has gained a pound since last visit. Encouraged patient to take Phentermine daily to help with weight loss. Encouraged to start exercising and working on diet modifications for weight loss. Refill not needed at this time because she has not completed previous prescription. Informed patient to call when she needs a refill. Only can get 2 refills. Follow up in 1 month to assess weight loss.

## 2023-02-12 NOTE — Assessment & Plan Note (Addendum)
Discontinue 40mg  daily. Continue with Celexa 30mg . Refilled Celexa 20mg  tablets (1.5mg ) daily.

## 2023-02-12 NOTE — Assessment & Plan Note (Addendum)
Continue Hydroxyzine since it is effective. Refilled medication.

## 2023-02-15 LAB — HEPATIC FUNCTION PANEL
AG Ratio: 1.2 (calc) (ref 1.0–2.5)
ALT: 10 U/L (ref 6–29)
AST: 16 U/L (ref 10–35)
Albumin: 4.2 g/dL (ref 3.6–5.1)
Alkaline phosphatase (APISO): 65 U/L (ref 31–125)
Bilirubin, Direct: 0.2 mg/dL (ref 0.0–0.2)
Globulin: 3.4 g/dL (calc) (ref 1.9–3.7)
Indirect Bilirubin: 0.6 mg/dL (calc) (ref 0.2–1.2)
Total Bilirubin: 0.8 mg/dL (ref 0.2–1.2)
Total Protein: 7.6 g/dL (ref 6.1–8.1)

## 2023-02-15 LAB — HEPATITIS C ANTIBODY: Hepatitis C Ab: NONREACTIVE

## 2023-03-07 ENCOUNTER — Encounter: Payer: Self-pay | Admitting: Family Medicine

## 2023-03-08 ENCOUNTER — Other Ambulatory Visit: Payer: Self-pay | Admitting: Family Medicine

## 2023-03-08 ENCOUNTER — Other Ambulatory Visit: Payer: Self-pay

## 2023-03-08 DIAGNOSIS — Z3041 Encounter for surveillance of contraceptive pills: Secondary | ICD-10-CM

## 2023-03-08 MED ORDER — NORETHINDRONE 0.35 MG PO TABS: 1.0000 | ORAL_TABLET | Freq: Every day | ORAL | 4 refills | Status: DC

## 2023-03-08 MED ORDER — NORETHINDRONE 0.35 MG PO TABS: 1.0000 | ORAL_TABLET | Freq: Every day | ORAL | 3 refills | Status: DC

## 2023-03-18 ENCOUNTER — Ambulatory Visit: Payer: No Typology Code available for payment source | Admitting: Family Medicine

## 2023-03-25 ENCOUNTER — Telehealth (INDEPENDENT_AMBULATORY_CARE_PROVIDER_SITE_OTHER): Payer: No Typology Code available for payment source | Admitting: Family Medicine

## 2023-03-25 ENCOUNTER — Encounter: Payer: Self-pay | Admitting: Family Medicine

## 2023-03-25 VITALS — Ht 62.0 in | Wt 187.0 lb

## 2023-03-25 DIAGNOSIS — F99 Mental disorder, not otherwise specified: Secondary | ICD-10-CM

## 2023-03-25 DIAGNOSIS — F5105 Insomnia due to other mental disorder: Secondary | ICD-10-CM | POA: Diagnosis not present

## 2023-03-25 DIAGNOSIS — Z6834 Body mass index (BMI) 34.0-34.9, adult: Secondary | ICD-10-CM

## 2023-03-25 DIAGNOSIS — E661 Drug-induced obesity: Secondary | ICD-10-CM

## 2023-03-25 DIAGNOSIS — R634 Abnormal weight loss: Secondary | ICD-10-CM

## 2023-03-25 MED ORDER — PHENTERMINE HCL 37.5 MG PO CAPS
37.5000 mg | ORAL_CAPSULE | ORAL | 0 refills | Status: DC
Start: 2023-03-25 — End: 2023-11-04

## 2023-03-25 MED ORDER — PHENTERMINE HCL 37.5 MG PO CAPS
37.5000 mg | ORAL_CAPSULE | ORAL | 0 refills | Status: DC
Start: 2023-03-25 — End: 2023-03-25

## 2023-03-25 MED ORDER — HYDROXYZINE PAMOATE 50 MG PO CAPS
50.0000 mg | ORAL_CAPSULE | Freq: Every day | ORAL | 2 refills | Status: AC
Start: 2023-03-25 — End: 2023-06-23

## 2023-03-25 NOTE — Assessment & Plan Note (Addendum)
Stop taking Hydroxyzine 25mg  at bedtime. Increased Hydroxyzine to 50mg  at bedtime. Sent in a new prescription of 50mg .  Advised if she had 25mg  tablets left over, she could take 2 tablets to make 50mg . Discussed about not having screen time at bedtime and during the time. Making the room cool and dark.

## 2023-03-25 NOTE — Progress Notes (Signed)
Virtual Visit via Video Note  I connected with Beth Frost on 03/25/23 at 10:06 am by a video enabled telemedicine application and verified that I am speaking with the correct person using two identifiers. Ended call at 10:25am.   Patient Location: Home Provider Location: office - [facility].    I discussed the limitations, risks, security and privacy concerns of performing an evaluation and management service by telephone and the availability of in person appointments. I also discussed with the patient that there may be a patient responsible charge related to this service. The patient expressed understanding and agreed to proceed, consent obtained  Chief Complaint  Patient presents with   Weight Loss    This is here today to F/U . Pt reports insomnia is a "hit and miss" Pt reports GYM 3-4 x week. Pt is walking on tredmill for 3 days.     Insomnia    History of Present Illness: Beth Frost is a 52 y.o. female that is following up on insomnia and weight loss.   Insomnia: On two visits ago, patient was prescribed Hydroxyzine 25mg  at bedtime. She reports "it is hit and miss" on insomnia. She reports sometimes it she have only one night a week where she can not sleep and other weeks it may be a few nights a week. She reports the last two nights, she has been awake, which then leads her to getting on her phone during the night.   Weight loss: Patient was prescribed Phentermine 37.5mg . On previous visit, she had gained a pound and was not taking medication. Also, she was not exercising and working on her diet for her weight loss. She has joined a gym. She has been going 3 times a week for the last 3 weeks. Treadmill 40-45 minutes. Diet: Increasing protein, decreasing carbohydrates. Drinking 4 16oz bottle waters.  Wt Readings from Last 3 Encounters:  03/25/23 187 lb (84.8 kg)  02/12/23 191 lb 8 oz (86.9 kg)  12/31/22 190 lb 12.8 oz (86.5 kg)      Patient Active  Problem List   Diagnosis Date Noted   Insomnia due to other mental disorder 01/01/2023   Seasonal allergies 01/01/2023   Polyphagia 07/16/2022   Other fatigue 07/02/2022   SOB (shortness of breath) on exertion 07/02/2022   Health care maintenance 07/02/2022   Vitamin D deficiency 07/02/2022   Elevated glucose 07/02/2022   Gastroesophageal reflux disease with esophagitis 07/02/2022   Obesity, Class II, BMI 35-39.9 02/12/2022   Depression    Snoring 02/09/2022   Malaise 06/01/2019   Asthma 05/13/2019   Hypertensive disorder 05/13/2019   Acute appendicitis 11/04/2017   Essential hypertension 11/26/2016    Class: Family History of   Herpes genitalis in women 07/18/2014    Class: History of   Past Medical History:  Diagnosis Date   Abnormal Pap smear    many yrs ago, 2020 LGSIL   Anxiety    Asthma    Depression    Fibroids    GERD (gastroesophageal reflux disease)    High blood pressure    Hypertension    Palpitations    STD (sexually transmitted disease)    HSV2   Vaginal wall cyst    Vitamin D deficiency    Past Surgical History:  Procedure Laterality Date   CHOLECYSTECTOMY N/A 05/11/2014   Procedure: LAPAROSCOPIC CHOLECYSTECTOMY WITH INTRAOPERATIVE CHOLANGIOGRAM;  Surgeon: Liz Malady, MD;  Location: [facility] OR;  Service: General;  Laterality: N/A;   CHOLECYSTECTOMY  04/2014   COLPOSCOPY     CRYOTHERAPY     for abnormal pap   DILATION AND CURETTAGE OF UTERUS     LAPAROSCOPIC APPENDECTOMY N/A 11/05/2017   Procedure: APPENDECTOMY LAPAROSCOPIC;  Surgeon: Abigail Miyamoto, MD;  Location: [facility] OR;  Service: General;  Laterality: N/A;   Allergies  Allergen Reactions   Flagyl [Metronidazole] Hives    Itchy all over   Prior to Admission medications   Medication Sig Start Date End Date Taking? Authorizing Provider  albuterol (VENTOLIN HFA) 108 (90 Base) MCG/ACT inhaler Inhale 2 puffs into the lungs every 6 (six) hours as needed for wheezing or shortness of breath.  12/18/22  Yes Mound, Rolly Salter E, FNP  amLODipine (NORVASC) 5 MG tablet Take 1 tablet (5 mg total) by mouth daily. 02/08/23  Yes Alveria Apley, NP  azelastine (ASTELIN) 0.1 % nasal spray Place 1 spray into both nostrils 2 (two) times daily. Use in each nostril as directed 09/11/20  Yes Janeece Agee, NP  cetirizine (ZYRTEC) 10 MG tablet TAKE 1 TABLET(10 MG) BY MOUTH DAILY 09/12/21  Yes Janeece Agee, NP  citalopram (CELEXA) 20 MG tablet Take 1.5 tablets (30 mg total) by mouth daily. 02/12/23  Yes Alveria Apley, NP  cyanocobalamin (VITAMIN B12) 1000 MCG tablet Take 1,000 mcg by mouth daily.   Yes [provider]  cyclobenzaprine (FLEXERIL) 5 MG tablet Take 1 tablet (5 mg total) by mouth 3 (three) times daily as needed for muscle spasms. 10/02/21  Yes Marguerita Beards, MD  fluticasone [facility]) 50 MCG/ACT nasal spray Place 1 spray into both nostrils daily. 12/18/22  Yes Mound, Acie Fredrickson, FNP  hydrOXYzine (VISTARIL) 25 MG capsule Take 1 capsule (25 mg total) by mouth at bedtime as needed. 02/12/23  Yes Alveria Apley, NP  Multiple Vitamins-Minerals (MULTIVITAMIN ADULT, MINERALS, PO) Take by mouth. 03/29/19  Yes [provider]  Multiple Vitamins-Minerals (MULTIVITAMIN PO) Take 1 tablet by mouth daily as needed (supplemental).    Yes [provider]  norethindrone (MICRONOR) 0.35 MG tablet Take 1 tablet (0.35 mg total) by mouth daily. 03/08/23  Yes Alveria Apley, NP  pantoprazole (PROTONIX) 40 MG tablet Take 1 tablet (40 mg total) by mouth daily. 01/01/23  Yes Alveria Apley, NP  triamcinolone cream (KENALOG) 0.1 % Apply 1 Application topically 2 (two) times daily. 12/31/22  Yes Alveria Apley, NP  valACYclovir (VALTREX) 1000 MG tablet TAKE 1 TABLET BY MOUTH DAILY AT ONSET OF OUTBREAK 02/12/23  Yes Alveria Apley, NP  Vitamin D, Ergocalciferol, (DRISDOL) 1.25 MG (50000 UNIT) CAPS capsule Take 1 capsule (50,000 Units total) by mouth every 7  (seven) days. 01/01/23  Yes Alveria Apley, NP  phentermine 37.5 MG capsule Take 1 capsule (37.5 mg total) by mouth every morning. 03/25/23   Alveria Apley, NP   Social History   Socioeconomic History   Marital status: Married    Spouse name: Not on file   Number of children: Not on file   Years of education: Not on file   Highest education level: 12th grade  Occupational History   Not on file  Tobacco Use   Smoking status: Never   Smokeless tobacco: Never  Vaping Use   Vaping Use: Never used  Substance and Sexual Activity   Alcohol use: Yes    Comment: 1 a day   Drug use: No   Sexual activity: Yes    Partners: Male    Birth control/protection: Pill  Other Topics  Concern   Not on file  Social History Narrative   Not on file   Social Determinants of Health   Financial Resource Strain: Low Risk  (02/11/2023)   Overall Financial Resource Strain (CARDIA)    Difficulty of Paying Living Expenses: Not very hard  Food Insecurity: No Food Insecurity (02/11/2023)   Hunger Vital Sign    Worried About Running Out of Food in the Last Year: Never true    Ran Out of Food in the Last Year: Never true  Transportation Needs: No Transportation Needs (02/11/2023)   PRAPARE - Administrator, Civil Service (Medical): No    Lack of Transportation (Non-Medical): No  Physical Activity: Unknown (02/11/2023)   Exercise Vital Sign    Days of Exercise per Week: 0 days    Minutes of Exercise per Session: Not on file  Stress: No Stress Concern Present (02/11/2023)   [employer] of Occupational Health - Occupational Stress Questionnaire    Feeling of Stress : Only a little  Social Connections: Unknown (02/11/2023)   Social Connection and Isolation Panel [NHANES]    Frequency of Communication with Friends and Family: More than three times a week    Frequency of Social Gatherings with Friends and Family: Once a week    Attends Religious Services: Patient declined    Automotive engineer or Organizations: No    Attends Engineer, structural: Not on file    Marital Status: Married  Catering manager Violence: Not on file    Observations/Objective: Today's Vitals   03/25/23 0940  Weight: 187 lb (84.8 kg)  Height: 5\' 2"  (1.575 m)   Physical Exam Constitutional:      General: She is not in acute distress.    Appearance: Normal appearance. She is not ill-appearing, toxic-appearing or diaphoretic.  HENT:     Head: Normocephalic and atraumatic.  Eyes:     General:        Right eye: No discharge.        Left eye: No discharge.     Conjunctiva/sclera: Conjunctivae normal.  Pulmonary:     Effort: Pulmonary effort is normal. No respiratory distress.  Skin:    General: Skin is dry.  Neurological:     Mental Status: She is alert and oriented to person, place, and time.  Psychiatric:        Mood and Affect: Mood normal.        Behavior: Behavior normal.        Thought Content: Thought content normal.        Judgment: Judgment normal.    Assessment and Plan: Insomnia due to other mental disorder Assessment & Plan: Stop taking Hydroxyzine 25mg  at bedtime. Increased Hydroxyzine to 50mg  at bedtime. Sent in a new prescription of 50mg .  Advised if she had 25mg  tablets left over, she could take 2 tablets to make 50mg . Discussed about not having screen time at bedtime and during the time. Making the room cool and dark.   Orders: -     hydrOXYzine Pamoate; Take 1 capsule (50 mg total) by mouth at bedtime.  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  Dispense: 30 capsule; Refill: 0  -Patient has lost around 4 pounds since previous visit. Congratulated patient  on starting to exercise and eat more healthy. Discussed possibly trying Weight Watchers  to help lose weight and and discussed about meal prepping. This could help not save time in the evenings after work and help with impulsive food choices. Refilled Phentermine 37.5mg  daily. She can one more refill after this.   Follow Up Instructions: Return in about 1 month (around 04/25/2023) for follow-up.   I discussed the assessment and treatment plan with the patient. The patient was provided an opportunity to ask questions and all were answered. The patient agreed with the plan and demonstrated an understanding of the instructions.   The patient was advised to call back or seek an in-person evaluation if the symptoms worsen or if the condition fails to improve as anticipated.  Zandra Abts, NP

## 2023-04-15 ENCOUNTER — Encounter: Payer: Self-pay | Admitting: Family Medicine

## 2023-04-15 DIAGNOSIS — A6009 Herpesviral infection of other urogenital tract: Secondary | ICD-10-CM

## 2023-04-16 MED ORDER — VALACYCLOVIR HCL 1 G PO TABS
ORAL_TABLET | ORAL | 1 refills | Status: DC
Start: 2023-04-16 — End: 2023-07-25

## 2023-05-06 ENCOUNTER — Ambulatory Visit: Payer: No Typology Code available for payment source | Admitting: Family Medicine

## 2023-07-12 ENCOUNTER — Other Ambulatory Visit: Payer: Self-pay | Admitting: Family Medicine

## 2023-07-12 DIAGNOSIS — R1013 Epigastric pain: Secondary | ICD-10-CM

## 2023-07-12 DIAGNOSIS — F32A Depression, unspecified: Secondary | ICD-10-CM

## 2023-07-24 ENCOUNTER — Other Ambulatory Visit: Payer: Self-pay | Admitting: Family Medicine

## 2023-07-24 DIAGNOSIS — A6009 Herpesviral infection of other urogenital tract: Secondary | ICD-10-CM

## 2023-08-12 ENCOUNTER — Encounter: Payer: Self-pay | Admitting: Family Medicine

## 2023-08-25 ENCOUNTER — Other Ambulatory Visit: Payer: Self-pay

## 2023-08-25 DIAGNOSIS — Z3041 Encounter for surveillance of contraceptive pills: Secondary | ICD-10-CM

## 2023-08-25 MED ORDER — NORETHINDRONE 0.35 MG PO TABS
1.0000 | ORAL_TABLET | Freq: Every day | ORAL | 3 refills | Status: DC
Start: 2023-08-25 — End: 2024-01-27

## 2023-11-01 ENCOUNTER — Ambulatory Visit: Payer: No Typology Code available for payment source | Admitting: Family Medicine

## 2023-11-04 ENCOUNTER — Ambulatory Visit: Payer: No Typology Code available for payment source | Admitting: Family Medicine

## 2023-11-04 VITALS — BP 110/78 | HR 77 | Temp 98.7°F | Wt 203.0 lb

## 2023-11-04 DIAGNOSIS — Z6834 Body mass index (BMI) 34.0-34.9, adult: Secondary | ICD-10-CM

## 2023-11-04 DIAGNOSIS — K2100 Gastro-esophageal reflux disease with esophagitis, without bleeding: Secondary | ICD-10-CM

## 2023-11-04 DIAGNOSIS — E66811 Obesity, class 1: Secondary | ICD-10-CM

## 2023-11-04 DIAGNOSIS — J302 Other seasonal allergic rhinitis: Secondary | ICD-10-CM

## 2023-11-04 DIAGNOSIS — R1013 Epigastric pain: Secondary | ICD-10-CM

## 2023-11-04 DIAGNOSIS — H6122 Impacted cerumen, left ear: Secondary | ICD-10-CM | POA: Diagnosis not present

## 2023-11-04 DIAGNOSIS — A6009 Herpesviral infection of other urogenital tract: Secondary | ICD-10-CM

## 2023-11-04 DIAGNOSIS — I10 Essential (primary) hypertension: Secondary | ICD-10-CM | POA: Diagnosis not present

## 2023-11-04 DIAGNOSIS — R0683 Snoring: Secondary | ICD-10-CM

## 2023-11-04 DIAGNOSIS — E661 Drug-induced obesity: Secondary | ICD-10-CM

## 2023-11-04 MED ORDER — PANTOPRAZOLE SODIUM 40 MG PO TBEC: 40.0000 mg | DELAYED_RELEASE_TABLET | Freq: Every day | ORAL | 0 refills | Status: DC

## 2023-11-04 MED ORDER — VALACYCLOVIR HCL 1 G PO TABS
ORAL_TABLET | ORAL | 0 refills | Status: DC
Start: 2023-11-04 — End: 2024-01-27

## 2023-11-04 MED ORDER — AMLODIPINE BESYLATE 5 MG PO TABS: 5.0000 mg | ORAL_TABLET | Freq: Every day | ORAL | 0 refills | Status: DC

## 2023-11-04 MED ORDER — PHENTERMINE HCL 37.5 MG PO CAPS: 37.5000 mg | ORAL_CAPSULE | ORAL | 0 refills | Status: DC

## 2023-11-04 MED ORDER — FLUTICASONE PROPIONATE 50 MCG/ACT NA SUSP
1.0000 | Freq: Every day | NASAL | 0 refills | Status: AC
Start: 2023-11-04 — End: ?

## 2023-11-04 NOTE — Progress Notes (Signed)
   Subjective:    Patient ID: Beth Frost, female    DOB: 1970/11/30, 52 y.o.   MRN: [id number]  HPI Here to follow up on several issues and with a new concern. For the past week she has had pressure in the left ear with muffled hearing. No URI symptoms or dizziness. Otherwise she has felt well and her BP has been stable at home. She does mention snoring, which has gotten much worse in the past 6 months. Her husband tells her about this. She saw Dr. Jetty Duhamel for a sleep apnea evaluation in March 2023, and he did not recommend any specific treatments other than losing weight.    Review of Systems  Constitutional: Negative.   HENT:  Positive for ear pain and hearing loss. Negative for congestion, postnasal drip, sinus pain and sore throat.   Eyes: Negative.   Respiratory: Negative.         Objective:   Physical Exam Constitutional:      General: She is not in acute distress.    Appearance: Normal appearance.  HENT:     Right Ear: Tympanic membrane, ear canal and external ear normal.     Left Ear: There is impacted cerumen.     Nose: Nose normal.     Mouth/Throat:     Pharynx: Oropharynx is clear.  Eyes:     Conjunctiva/sclera: Conjunctivae normal.  Cardiovascular:     Rate and Rhythm: Normal rate and regular rhythm.     Pulses: Normal pulses.     Heart sounds: Normal heart sounds.  Pulmonary:     Effort: Pulmonary effort is normal.     Breath sounds: Normal breath sounds.  Musculoskeletal:     Right lower leg: No edema.     Left lower leg: No edema.  Lymphadenopathy:     Cervical: No cervical adenopathy.  Neurological:     Mental Status: She is alert.           Assessment & Plan:  Her HTN and GERD and allergies are stable, and we refilled a number of her medications. It sounds like she has developed more of a sleep apnea picture, so I recommended she follow up with Dr. Maple Hudson. She also has a left ear cerumen impaction. After informed consent was obtained, the  left ear canal was irrigated clear with water. She tolerated the procedure well, and afterward she said the ear pressure was gone.  Gershon Crane, MD

## 2023-12-09 LAB — HM MAMMOGRAPHY

## 2023-12-10 ENCOUNTER — Encounter: Payer: Self-pay | Admitting: Family Medicine

## 2023-12-27 ENCOUNTER — Encounter: Payer: Self-pay | Admitting: Family Medicine

## 2023-12-27 DIAGNOSIS — I10 Essential (primary) hypertension: Secondary | ICD-10-CM

## 2023-12-27 DIAGNOSIS — F32A Depression, unspecified: Secondary | ICD-10-CM

## 2024-01-03 MED ORDER — CITALOPRAM HYDROBROMIDE 20 MG PO TABS: 20.0000 mg | ORAL_TABLET | Freq: Every day | ORAL | 0 refills | Status: DC

## 2024-01-03 NOTE — Telephone Encounter (Signed)
 Called spoke with patient, patient is sch for follow-up per last OV note,

## 2024-01-13 ENCOUNTER — Ambulatory Visit: Payer: No Typology Code available for payment source | Admitting: Family Medicine

## 2024-01-17 MED ORDER — AMLODIPINE BESYLATE 5 MG PO TABS
5.0000 mg | ORAL_TABLET | Freq: Every day | ORAL | 0 refills | Status: DC
Start: 2024-01-17 — End: 2024-01-27

## 2024-01-17 NOTE — Addendum Note (Signed)
 Addended by: Kenna Gilbert B on: 01/17/2024 07:57 AM   Modules accepted: Orders

## 2024-01-27 ENCOUNTER — Encounter: Payer: Self-pay | Admitting: Family Medicine

## 2024-01-27 ENCOUNTER — Ambulatory Visit: Payer: No Typology Code available for payment source | Admitting: Family Medicine

## 2024-01-27 VITALS — BP 120/60 | Wt 207.4 lb

## 2024-01-27 DIAGNOSIS — E559 Vitamin D deficiency, unspecified: Secondary | ICD-10-CM

## 2024-01-27 DIAGNOSIS — R5383 Other fatigue: Secondary | ICD-10-CM

## 2024-01-27 DIAGNOSIS — F32A Depression, unspecified: Secondary | ICD-10-CM | POA: Diagnosis not present

## 2024-01-27 DIAGNOSIS — Z3041 Encounter for surveillance of contraceptive pills: Secondary | ICD-10-CM

## 2024-01-27 DIAGNOSIS — E66812 Obesity, class 2: Secondary | ICD-10-CM

## 2024-01-27 DIAGNOSIS — I10 Essential (primary) hypertension: Secondary | ICD-10-CM

## 2024-01-27 DIAGNOSIS — A6009 Herpesviral infection of other urogenital tract: Secondary | ICD-10-CM

## 2024-01-27 DIAGNOSIS — K2100 Gastro-esophageal reflux disease with esophagitis, without bleeding: Secondary | ICD-10-CM

## 2024-01-27 DIAGNOSIS — J302 Other seasonal allergic rhinitis: Secondary | ICD-10-CM

## 2024-01-27 LAB — CBC WITH DIFFERENTIAL/PLATELET
Basophils Absolute: 0 10*3/uL (ref 0.0–0.1)
Basophils Relative: 0.6 % (ref 0.0–3.0)
Eosinophils Absolute: 0.1 10*3/uL (ref 0.0–0.7)
Eosinophils Relative: 0.9 % (ref 0.0–5.0)
HCT: 38.7 % (ref 36.0–46.0)
Hemoglobin: 13 g/dL (ref 12.0–15.0)
Lymphocytes Relative: 34.5 % (ref 12.0–46.0)
Lymphs Abs: 2.5 10*3/uL (ref 0.7–4.0)
MCHC: 33.5 g/dL (ref 30.0–36.0)
MCV: 84.1 fl (ref 78.0–100.0)
Monocytes Absolute: 0.5 10*3/uL (ref 0.1–1.0)
Monocytes Relative: 7.2 % (ref 3.0–12.0)
Neutro Abs: 4.1 10*3/uL (ref 1.4–7.7)
Neutrophils Relative %: 56.8 % (ref 43.0–77.0)
Platelets: 338 10*3/uL (ref 150.0–400.0)
RBC: 4.6 Mil/uL (ref 3.87–5.11)
RDW: 14 % (ref 11.5–15.5)
WBC: 7.2 10*3/uL (ref 4.0–10.5)

## 2024-01-27 LAB — LIPID PANEL
Cholesterol: 181 mg/dL (ref 0–200)
HDL: 65.9 mg/dL (ref >39.00)
LDL Cholesterol: 108 mg/dL — ABNORMAL HIGH (ref 0–99)
NonHDL: 115.2
Total CHOL/HDL Ratio: 3
Triglycerides: 36 mg/dL (ref 0.0–149.0)
VLDL: 7.2 mg/dL (ref 0.0–40.0)

## 2024-01-27 LAB — COMPREHENSIVE METABOLIC PANEL
ALT: 13 U/L (ref 0–35)
AST: 17 U/L (ref 0–37)
Albumin: 4 g/dL (ref 3.5–5.2)
Alkaline Phosphatase: 67 U/L (ref 39–117)
BUN: 8 mg/dL (ref 6–23)
CO2: 28 meq/L (ref 19–32)
Calcium: 8.9 mg/dL (ref 8.4–10.5)
Chloride: 104 meq/L (ref 96–112)
Creatinine, Ser: 0.49 mg/dL (ref 0.40–1.20)
GFR: 109.73 mL/min (ref >60.00)
Glucose, Bld: 90 mg/dL (ref 70–99)
Potassium: 3.6 meq/L (ref 3.5–5.1)
Sodium: 138 meq/L (ref 135–145)
Total Bilirubin: 1 mg/dL (ref 0.2–1.2)
Total Protein: 7.2 g/dL (ref 6.0–8.3)

## 2024-01-27 LAB — TSH: TSH: 1.36 u[IU]/mL (ref 0.35–5.50)

## 2024-01-27 LAB — VITAMIN B12: Vitamin B-12: 315 pg/mL (ref 211–911)

## 2024-01-27 LAB — VITAMIN D 25 HYDROXY (VIT D DEFICIENCY, FRACTURES): VITD: 24.64 ng/mL — ABNORMAL LOW (ref 30.00–100.00)

## 2024-01-27 LAB — HEMOGLOBIN A1C: Hgb A1c MFr Bld: 5.6 % (ref 4.6–6.5)

## 2024-01-27 MED ORDER — NORETHINDRONE 0.35 MG PO TABS
1.0000 | ORAL_TABLET | Freq: Every day | ORAL | 3 refills | Status: AC
Start: 2024-01-27 — End: ?

## 2024-01-27 MED ORDER — CITALOPRAM HYDROBROMIDE 20 MG PO TABS: 30.0000 mg | ORAL_TABLET | Freq: Every day | ORAL | Status: DC

## 2024-01-27 MED ORDER — VALACYCLOVIR HCL 1 G PO TABS: ORAL_TABLET | ORAL | 0 refills | Status: DC

## 2024-01-27 MED ORDER — CETIRIZINE HCL 10 MG PO TABS: 10.0000 mg | ORAL_TABLET | Freq: Every day | ORAL | 0 refills | Status: AC

## 2024-01-27 MED ORDER — AMLODIPINE BESYLATE 5 MG PO TABS: 5.0000 mg | ORAL_TABLET | Freq: Every day | ORAL | 0 refills | Status: DC

## 2024-01-27 NOTE — Assessment & Plan Note (Addendum)
 Unstable. Scored 12 on PHQ-9 screening and 7 on GAD 7 screening. Increase Citalopram to 30mg  daily from 20mg  daily. Take 1.5 pills daily. Did not refill medication with pharmacy due to just receiving a refill.

## 2024-01-27 NOTE — Assessment & Plan Note (Signed)
 Stable. Continue with Pantoprazole 40mg  daily. This medication was refilled on previous visit in December by Dr. Clent Ridges.

## 2024-01-27 NOTE — Assessment & Plan Note (Signed)
 Suspect her fatigue is coming from depression, weight gain, possible sleep apnea, and possible deficiency. Ordered CMP, CBC, Iron Panel, Vitamin D/B12, and TSH for a reason of fatigue. Did recommend patient follow up with pulmonary for another sleep apnea test.

## 2024-01-27 NOTE — Patient Instructions (Addendum)
-  It was great to see you today.  -Ordered labs. Office will call with lab results and you will see them on MyChart.  -Refilled several medications: Amlodipine, Cetirizine, Norethindrone, and Valacyclovir.  -INCREASE Citalopram to 30mg  daily from 20mg  daily. Take 1.5 pills daily. Did not refill medication with pharmacy due to just receiving a refill.  -Please make an appointment with pulmonary for another sleep apnea test for snoring and fatigue. If you are unable to make an appointment and need a new referral, please call the office or send a MyChart message.  Dr. Colon Branch, MD [address] [place], [place], [place] [address]  2.3 mi [phone] -Please call insurance company to see what medications are covered under your plan for weight loss. Also, you may ask about Vyvanse for binge eating. Please send a MyChart message about your findings. Prefer to not to be on Phentermine with having high blood pressure.  -Follow up in 1 month.

## 2024-01-27 NOTE — Assessment & Plan Note (Addendum)
 Patient was prescribed Phentermine at her last appointment with Dr. Clent Ridges. She reports she has not started medication. She reports she does not have the energy to even lose weight. Suspect it is combination of her depression and fatigue. Advised to please call insurance company to see what medications are covered under her plan for weight loss. Also, recommend to ask about Vyvanse for binge eating. Please send a MyChart message about her findings. Prefer to not to be on Phentermine with having high blood pressure. Ordered CMP, CBC, Lipid Panel, A1c and TSH due to obesity. Patient is fasting.

## 2024-01-27 NOTE — Assessment & Plan Note (Signed)
 Controlled. Continue Valtrex 1000mg  daily. Refilled medication.

## 2024-01-27 NOTE — Assessment & Plan Note (Signed)
 Stable. Continue Azelastine nasal spray, Cetirizine, and Flonase nasal spray. Refilled Cetrizine.

## 2024-01-27 NOTE — Assessment & Plan Note (Signed)
 Stable. Continue with Amlodipine 5mg  daily. Refilled medication. Ordered CMP to assess kidney function.

## 2024-01-27 NOTE — Progress Notes (Signed)
 Established Patient Office Visit   Subjective:  Patient ID: Beth Frost, female    DOB: Apr 21, 1971  Age: 53 y.o. MRN: [id number]  No chief complaint on file.   HPI HTN: Chronic. Patient is taking Amlodipine 5mg  tablet. Patient is monitoring her blood pressure at work. Usually around 134/70's. Denies CP, SHOB, HA, dizziness, lightheadedness, or lower extremity edema.   Depression: Chronic. Patient is taking Citalopram 20mg  daily. Patient reports some days she feels like she wants to stay in bed, like she does not have the physical energy to get up. She feels like some of her not wanting to get up could be from her depression. Previously increased Citalopram to 30mg  and 40mg . She reports she believes the 30mg  helped at one time, but thought the 40mg  was too much.   Birth control: Patient is taking Norethindrone 0.35mg  tablet daily. No concerns, but requesting a refill.   Vitamin D deficiency: Patient was taking Vitamin D 50,000IU weekly. Completed medication.   Seasonal allergies: Chronic. Patient is using Azelastine nasal spray as needed, usually uses it more spring and some in the winter months; uses Cetirizine 10mg  during the spring, not used in a while; and uses Flonase nasal spray.   GERD: Chronic. Patient is taking Pantoprazole 40mg  daily. Effective.   Obesity/Weight Loss: Patient was prescribed Phentermine 37.5mg  for weight loss at last visit with Dr. Clent Ridges, but she reports she has not took any of the medication. She reports she has not been motivated to lose the weight. She has felt physical tired. She has previously been on this medication last year.   Herpes genital: Patient is taking Valtrex 1000mg  daily to prevent outbreaks. Not had an out break recently.  ROS See HPI above     Objective:   BP 120/60 (BP Location: Right Arm, Patient Position: Sitting, Cuff Size: Normal)   Wt 207 lb 6.4 oz (94.1 kg)   BMI 37.93 kg/m    Physical Exam Vitals reviewed.  Constitutional:       General: She is not in acute distress.    Appearance: Normal appearance. She is obese. She is not ill-appearing, toxic-appearing or diaphoretic.  HENT:     Head: Normocephalic and atraumatic.  Eyes:     General:        Right eye: No discharge.        Left eye: No discharge.     Conjunctiva/sclera: Conjunctivae normal.  Neck:     Thyroid: No thyromegaly.  Cardiovascular:     Rate and Rhythm: Normal rate and regular rhythm.     Heart sounds: Normal heart sounds. No murmur heard.    No friction rub. No gallop.  Pulmonary:     Effort: Pulmonary effort is normal. No respiratory distress.     Breath sounds: Normal breath sounds.  Musculoskeletal:        General: Normal range of motion.     Right lower leg: No edema.     Left lower leg: No edema.  Skin:    General: Skin is warm and dry.  Neurological:     General: No focal deficit present.     Mental Status: She is alert and oriented to person, place, and time. Mental status is at baseline.     Gait: Gait normal.  Psychiatric:        Mood and Affect: Mood normal.        Behavior: Behavior normal.        Thought Content: Thought content normal.  Judgment: Judgment normal.      Assessment & Plan:  Essential hypertension Assessment & Plan: Stable. Continue with Amlodipine 5mg  daily. Refilled medication. Ordered CMP to assess kidney function.   Orders: -     Comprehensive metabolic panel -     amLODIPine Besylate; Take 1 tablet (5 mg total) by mouth daily.  Dispense: 90 tablet; Refill: 0  Depression, unspecified depression type Assessment & Plan: Unstable. Scored 12 on PHQ-9 screening and 7 on GAD 7 screening. Increase Citalopram to 30mg  daily from 20mg  daily. Take 1.5 pills daily. Did not refill medication with pharmacy due to just receiving a refill.   Orders: -     Citalopram Hydrobromide; Take 1.5 tablets (30 mg total) by mouth daily.  Encounter for surveillance of contraceptive pills -     Norethindrone;  Take 1 tablet (0.35 mg total) by mouth daily.  Dispense: 84 tablet; Refill: 3  Vitamin D deficiency Assessment & Plan: Ordered vitamin D since she has had a deficiency and having symptoms of fatigue and mood changes. Office will call with lab results and treat accordingly.   Orders: -     VITAMIN D 25 Hydroxy (Vit-D Deficiency, Fractures)  Seasonal allergies Assessment & Plan: Stable. Continue Azelastine nasal spray, Cetirizine, and Flonase nasal spray. Refilled Cetrizine.   Orders: -     Cetirizine HCl; Take 1 tablet (10 mg total) by mouth at bedtime for 90 doses.  Dispense: 90 tablet; Refill: 0  Gastroesophageal reflux disease with esophagitis, unspecified whether hemorrhage Assessment & Plan: Stable. Continue with Pantoprazole 40mg  daily. This medication was refilled on previous visit in December by Dr. Clent Ridges.    Obesity, Class II, BMI 35-39.9 Assessment & Plan: Patient was prescribed Phentermine at her last appointment with Dr. Clent Ridges. She reports she has not started medication. She reports she does not have the energy to even lose weight. Suspect it is combination of her depression and fatigue. Advised to please call insurance company to see what medications are covered under her plan for weight loss. Also, recommend to ask about Vyvanse for binge eating. Please send a MyChart message about her findings. Prefer to not to be on Phentermine with having high blood pressure. Ordered CMP, CBC, Lipid Panel, A1c and TSH due to obesity. Patient is fasting.   Orders: -     Comprehensive metabolic panel -     CBC with Differential/Platelet -     Lipid panel -     Hemoglobin A1c -     TSH  Herpes genitalis in women Assessment & Plan: Controlled. Continue Valtrex 1000mg  daily. Refilled medication.   Orders: -     valACYclovir HCl; TAKE 1 TABLET BY MOUTH DAILY AT ONSET OF OUTBREAK  Dispense: 90 tablet; Refill: 0  Other fatigue Assessment & Plan: Suspect her fatigue is coming from  depression, weight gain, possible sleep apnea, and possible deficiency. Ordered CMP, CBC, Iron Panel, Vitamin D/B12, and TSH for a reason of fatigue. Did recommend patient follow up with pulmonary for another sleep apnea test.   Orders: -     Comprehensive metabolic panel -     VITAMIN D 25 Hydroxy (Vit-D Deficiency, Fractures) -     CBC with Differential/Platelet -     Iron, TIBC and Ferritin Panel -     Vitamin B12 -     TSH  1.Refilled Norethindrone birth control.  2.Please make an appointment with pulmonary for another sleep apnea test for snoring and fatigue. She discussed this at her  last appointment with Dr. Clent Ridges back in December. Advised if she is unable to make an appointment and need a new referral, please call the office or send a MyChart message.  Dr. Colon Branch, MD [address] [place], [place], [place] [address]  2.3 mi [phone]  Return in about 1 month (around 02/27/2024) for follow-up.   Zandra Abts, NP

## 2024-01-27 NOTE — Assessment & Plan Note (Signed)
Ordered vitamin D since she has had a deficiency and having symptoms of fatigue and mood changes. Office will call with lab results and treat accordingly.

## 2024-01-28 LAB — IRON,TIBC AND FERRITIN PANEL
%SAT: 20 % (ref 16–45)
Ferritin: 10 ng/mL — ABNORMAL LOW (ref 16–232)
Iron: 91 ug/dL (ref 45–160)
TIBC: 445 ug/dL (ref 250–450)

## 2024-02-04 ENCOUNTER — Ambulatory Visit (INDEPENDENT_AMBULATORY_CARE_PROVIDER_SITE_OTHER): Admitting: Family Medicine

## 2024-02-04 VITALS — BP 124/86 | HR 75 | Temp 98.0°F | Ht 62.0 in | Wt 202.0 lb

## 2024-02-04 DIAGNOSIS — E669 Obesity, unspecified: Secondary | ICD-10-CM | POA: Diagnosis not present

## 2024-02-04 DIAGNOSIS — R632 Polyphagia: Secondary | ICD-10-CM

## 2024-02-04 MED ORDER — LISDEXAMFETAMINE DIMESYLATE 50 MG PO CAPS: 50.0000 mg | ORAL_CAPSULE | Freq: Every day | ORAL | 0 refills | Status: DC

## 2024-02-04 NOTE — Patient Instructions (Addendum)
-  Prescribed Vyvanse 50mg  tablet for binge eating. Take 1 tablet daily in the morning. If you are doing good in 1 month with medication, dose can be increase to 70mg  daily.  -Recommend to work on eating healthy and participating in regular exercise. -Urine drug screen obtained and will need to be completed periodically.  -Signed controlled substance agreement since Vyvanse is a controlled substance. This will need to completed yearly.

## 2024-02-04 NOTE — Progress Notes (Signed)
   Established Patient Office Visit   Subjective:  Patient ID: Beth Frost, female    DOB: 12-31-1970  Age: 53 y.o. MRN: [id number]  Chief Complaint  Patient presents with   Follow-up    Pt reports she is here for follow up starting vyvanse, give urine sample and sign paperwork.     HPI Patient is here for weight loss. She has tried Phentermine, Weight Watchers, Goli, cabbage diet, and Alli. These medications have not been helpful. She does have depression that will lead her into binge eating. 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 She would like to try Vyvanse to see if this will help her cut back on binge eating that has caused this significant weight gain.  ROS See HPI above     Objective:   BP 124/86 (BP Location: Left Arm, Patient Position: Sitting, Cuff Size: Normal)   Pulse 75   Temp 98 F (36.7 C) (Oral)   Ht 5\' 2"  (1.575 m)   Wt 202 lb (91.6 kg)   LMP 01/21/2024 (Exact Date)   SpO2 96%   BMI 36.95 kg/m    Physical Exam Vitals reviewed.  Constitutional:      General: She is not in acute distress.    Appearance: Normal appearance. She is obese. She is not ill-appearing, toxic-appearing or diaphoretic.  HENT:     Head: Normocephalic and atraumatic.  Eyes:     General:        Right eye: No discharge.        Left eye: No discharge.     Conjunctiva/sclera: Conjunctivae normal.  Cardiovascular:     Rate and Rhythm: Normal rate.  Pulmonary:     Effort: Pulmonary effort is normal. No respiratory distress.  Musculoskeletal:        General: Normal range of motion.  Skin:    General: Skin is warm and dry.  Neurological:     General: No focal deficit present.     Mental Status: She is alert and oriented to person, place, and time. Mental status is at baseline.  Psychiatric:        Mood and Affect: Mood normal.        Behavior: Behavior normal.        Thought Content: Thought content normal.        Judgment: Judgment normal.      Assessment &  Plan:  Binge eating -     DRUG MONITOR, PANEL 1, SCREEN, URINE -     Lisdexamfetamine Dimesylate; Take 1 capsule (50 mg total) by mouth daily.  Dispense: 30 capsule; Refill: 0  Obesity (BMI 30-39.9) -     DRUG MONITOR, PANEL 1, SCREEN, URINE -     Lisdexamfetamine Dimesylate; Take 1 capsule (50 mg total) by mouth daily.  Dispense: 30 capsule; Refill: 0  -Prescribed Vyvanse 50mg  tablet for binge eating. Take 1 tablet daily in the morning. If you are doing good in 1 month with medication, dose can be increase to 70mg  daily.  -Recommend to work on eating healthy and participating in regular exercise. -Urine drug screen obtained and will need to be completed periodically.  -Signed controlled substance agreement since Vyvanse is a controlled substance. This will need to completed yearly.   Zandra Abts, NP

## 2024-02-05 LAB — DRUG MONITOR, PANEL 1, SCREEN, URINE
Amphetamines: NEGATIVE ng/mL (ref <500)
Barbiturates: NEGATIVE ng/mL (ref <300)
Benzodiazepines: NEGATIVE ng/mL (ref <100)
Cocaine Metabolite: NEGATIVE ng/mL (ref <150)
Creatinine: 26.7 mg/dL (ref >=20.0)
Marijuana Metabolite: NEGATIVE ng/mL (ref <20)
Methadone Metabolite: NEGATIVE ng/mL (ref <100)
Opiates: NEGATIVE ng/mL (ref <100)
Oxidant: NEGATIVE ug/mL (ref <200)
Oxycodone: NEGATIVE ng/mL (ref <100)
Phencyclidine: NEGATIVE ng/mL (ref <25)
pH: 6.2 (ref 4.5–9.0)

## 2024-02-05 LAB — DM TEMPLATE

## 2024-02-07 ENCOUNTER — Encounter: Payer: Self-pay | Admitting: Family Medicine

## 2024-02-15 ENCOUNTER — Other Ambulatory Visit (HOSPITAL_COMMUNITY): Payer: Self-pay

## 2024-02-15 ENCOUNTER — Telehealth: Payer: Self-pay

## 2024-02-15 NOTE — Telephone Encounter (Signed)
 Pharmacy Patient Advocate Encounter   Received notification from CoverMyMeds that prior authorization for VYVANSE 50 MG CAPSULE is required/requested.   Insurance verification completed.   The patient is insured through [facility] .   Per test claim: PA required; PA submitted to above mentioned insurance via Prompt PA Key/confirmation #/EOC [id number] Status is pending    [email or web address]

## 2024-02-16 ENCOUNTER — Other Ambulatory Visit (HOSPITAL_COMMUNITY): Payer: Self-pay

## 2024-02-16 NOTE — Telephone Encounter (Signed)
 Pharmacy Patient Advocate Encounter  Received notification from [employer] that Prior Authorization for VYVANSE 50 MG CAPSULE  has been APPROVED from 02/16/24 to 02/14/26. Ran test claim, Copay is $10. This test claim was processed through [facility]- copay amounts may vary at other pharmacies due to pharmacy/plan contracts, or as the patient moves through the different stages of their insurance plan.   PA #/Case ID/Reference #: [id number]

## 2024-02-17 DIAGNOSIS — F32A Depression, unspecified: Secondary | ICD-10-CM

## 2024-02-17 DIAGNOSIS — R1013 Epigastric pain: Secondary | ICD-10-CM

## 2024-02-22 ENCOUNTER — Other Ambulatory Visit: Payer: Self-pay | Admitting: Family Medicine

## 2024-02-22 ENCOUNTER — Other Ambulatory Visit: Payer: Self-pay

## 2024-02-22 DIAGNOSIS — R632 Polyphagia: Secondary | ICD-10-CM

## 2024-02-22 DIAGNOSIS — E669 Obesity, unspecified: Secondary | ICD-10-CM

## 2024-02-22 MED ORDER — LISDEXAMFETAMINE DIMESYLATE 50 MG PO CAPS
50.0000 mg | ORAL_CAPSULE | Freq: Every day | ORAL | 0 refills | Status: DC
  Filled 2024-02-22 – 2024-02-24 (×3): qty 30, 30d supply, fill #0

## 2024-02-24 ENCOUNTER — Other Ambulatory Visit: Payer: Self-pay

## 2024-02-28 ENCOUNTER — Other Ambulatory Visit: Payer: Self-pay

## 2024-02-29 ENCOUNTER — Ambulatory Visit: Admitting: Family Medicine

## 2024-03-03 ENCOUNTER — Other Ambulatory Visit: Payer: Self-pay | Admitting: Family Medicine

## 2024-03-03 DIAGNOSIS — R1013 Epigastric pain: Secondary | ICD-10-CM

## 2024-03-05 ENCOUNTER — Other Ambulatory Visit: Payer: Self-pay | Admitting: Family Medicine

## 2024-03-05 DIAGNOSIS — F32A Depression, unspecified: Secondary | ICD-10-CM

## 2024-03-06 MED ORDER — PANTOPRAZOLE SODIUM 40 MG PO TBEC: 40.0000 mg | DELAYED_RELEASE_TABLET | Freq: Every day | ORAL | 0 refills | Status: DC

## 2024-03-06 MED ORDER — CITALOPRAM HYDROBROMIDE 20 MG PO TABS: 30.0000 mg | ORAL_TABLET | Freq: Every day | ORAL | Status: DC

## 2024-03-09 MED ORDER — CITALOPRAM HYDROBROMIDE 20 MG PO TABS: 30.0000 mg | ORAL_TABLET | Freq: Every day | ORAL | Status: DC

## 2024-03-09 NOTE — Addendum Note (Signed)
 Addended by: Teodoro Feller B on: 03/09/2024 09:55 AM   Modules accepted: Orders

## 2024-03-13 ENCOUNTER — Other Ambulatory Visit: Payer: Self-pay | Admitting: Family Medicine

## 2024-03-13 DIAGNOSIS — F32A Depression, unspecified: Secondary | ICD-10-CM

## 2024-03-13 NOTE — Telephone Encounter (Signed)
 Copied from CRM [id number]. Topic: Clinical - Medication Refill >> Mar 13, 2024  2:49 PM Beth Frost wrote: Most Recent Primary Care Visit:  Provider: Francenia Ingle  Department: [facility]  Visit Type: OFFICE VISIT  Date: 02/04/2024  Medication: citalopram  (CELEXA ) 20 MG tablet  Has the patient contacted their pharmacy? Yes (Agent: If no, request that the patient contact the pharmacy for the refill. If patient does not wish to contact the pharmacy document the reason why and proceed with request.) (Agent: If yes, when and what did the pharmacy advise?)  Is this the correct pharmacy for this prescription? Yes If no, delete pharmacy and type the correct one.  This is the patient's preferred pharmacy:  [id number] [name], [place] - [phone] AT [facility] OF [facility] & [address] [place] [address] Phone: [phone] Fax: [phone]   Has the prescription been filled recently? No  Is the patient out of the medication? Yes  Has the patient been seen for an appointment in the last year OR does the patient have an upcoming appointment? No  Can we respond through MyChart? Yes  Agent: Please be advised that Rx refills may take up to 3 business days. We ask that you follow-up with your pharmacy.

## 2024-03-20 MED ORDER — CITALOPRAM HYDROBROMIDE 20 MG PO TABS
30.0000 mg | ORAL_TABLET | Freq: Every day | ORAL | Status: DC
Start: 2024-03-20 — End: 2024-03-21

## 2024-03-20 NOTE — Addendum Note (Signed)
 Addended by: Teodoro Feller B on: 03/20/2024 11:12 AM   Modules accepted: Orders

## 2024-03-21 ENCOUNTER — Other Ambulatory Visit: Payer: Self-pay | Admitting: Family Medicine

## 2024-03-21 ENCOUNTER — Other Ambulatory Visit: Payer: Self-pay

## 2024-03-21 DIAGNOSIS — F32A Depression, unspecified: Secondary | ICD-10-CM

## 2024-03-21 MED ORDER — CITALOPRAM HYDROBROMIDE 20 MG PO TABS: 20.0000 mg | ORAL_TABLET | Freq: Every day | ORAL | 2 refills | Status: DC

## 2024-03-28 ENCOUNTER — Other Ambulatory Visit: Payer: Self-pay | Admitting: Family Medicine

## 2024-03-28 DIAGNOSIS — E669 Obesity, unspecified: Secondary | ICD-10-CM

## 2024-03-28 DIAGNOSIS — R632 Polyphagia: Secondary | ICD-10-CM

## 2024-03-29 ENCOUNTER — Other Ambulatory Visit: Payer: Self-pay

## 2024-03-29 ENCOUNTER — Other Ambulatory Visit: Payer: Self-pay | Admitting: Family Medicine

## 2024-03-29 DIAGNOSIS — R632 Polyphagia: Secondary | ICD-10-CM

## 2024-03-29 DIAGNOSIS — E669 Obesity, unspecified: Secondary | ICD-10-CM

## 2024-03-29 MED ORDER — LISDEXAMFETAMINE DIMESYLATE 50 MG PO CAPS
50.0000 mg | ORAL_CAPSULE | Freq: Every day | ORAL | 0 refills | Status: DC
  Filled 2024-03-29: qty 30, 30d supply, fill #0

## 2024-03-29 NOTE — Progress Notes (Signed)
 Patient is taking medication for binge eating. She has an appt scheduled on Friday. Was going to discuss about increasing, but patient has messaged to say she would like to stay at 50mg  with no increase.

## 2024-03-31 ENCOUNTER — Ambulatory Visit: Admitting: Family Medicine

## 2024-03-31 ENCOUNTER — Encounter: Payer: Self-pay | Admitting: Family Medicine

## 2024-03-31 VITALS — BP 120/74 | HR 96 | Temp 98.5°F | Ht 62.0 in | Wt 187.0 lb

## 2024-03-31 DIAGNOSIS — Z6834 Body mass index (BMI) 34.0-34.9, adult: Secondary | ICD-10-CM

## 2024-03-31 DIAGNOSIS — E66811 Obesity, class 1: Secondary | ICD-10-CM | POA: Diagnosis not present

## 2024-03-31 DIAGNOSIS — M79672 Pain in left foot: Secondary | ICD-10-CM

## 2024-03-31 DIAGNOSIS — R632 Polyphagia: Secondary | ICD-10-CM

## 2024-03-31 DIAGNOSIS — E661 Drug-induced obesity: Secondary | ICD-10-CM | POA: Diagnosis not present

## 2024-03-31 MED ORDER — PREDNISONE 10 MG PO TABS
ORAL_TABLET | ORAL | 0 refills | Status: AC
Start: 2024-03-31 — End: 2024-04-06

## 2024-03-31 NOTE — Patient Instructions (Signed)
-  Congratulations on losing weight. Continue with Vyvanse  50mg  daily. When you get a week out from needing a refill, please send a MyChart message to provide an update on how you are tolerating medication and if you would like to increase dose to 70mg  daily.  -Prescribed Prednisone  10mg  tablet, 6 day taper for left foot pain. Please do not take any NSAIDS while taking medication, but can still take Tylenol  for pain. If not improved, please send a MyChart message and will refer to podiatry.  -Follow up in 3 months.

## 2024-03-31 NOTE — Progress Notes (Signed)
 Established Patient Office Visit   Subjective:  Patient ID: Beth Frost, female    DOB: 1971/02/21  Age: 53 y.o. MRN: [id number]  Chief Complaint  Patient presents with   Medication Management    HPI Binge eating/Obesity: On previous appointment, patient started Vyvanse  50mg  daily. She has lost 15 IBS since starting medication. She reports she is not eating as much and not binge eating on snacks. Exercising: Walking about 30 minutes a few times a week. Started second month at 50mg  today.   Left foot pain: Patient is complaining of intermittent sharp, throbbing pain on lateral side of left foot pain. Started over a month or two ago. Daily pain; denies injury; denies numbness and tingling. Pain gradually starts in the morning; Tylenol  and Ibuprofen will help some, but pain returns.  ROS See HPI above     Objective:   BP 120/74   Pulse 96   Temp 98.5 F (36.9 C) (Oral)   Ht 5\' 2"  (1.575 m)   Wt 187 lb (84.8 kg)   LMP 03/21/2024 (Approximate)   SpO2 99%   BMI 34.20 kg/m  Wt Readings from Last 3 Encounters:  03/31/24 187 lb (84.8 kg)  02/04/24 202 lb (91.6 kg)  01/27/24 207 lb 6.4 oz (94.1 kg)     Physical Exam Vitals reviewed.  Constitutional:      General: She is not in acute distress.    Appearance: Normal appearance. She is obese. She is not ill-appearing, toxic-appearing or diaphoretic.  Eyes:     General:        Right eye: No discharge.        Left eye: No discharge.     Conjunctiva/sclera: Conjunctivae normal.  Cardiovascular:     Rate and Rhythm: Normal rate.  Pulmonary:     Effort: Pulmonary effort is normal. No respiratory distress.     Breath sounds: Normal breath sounds.  Musculoskeletal:        General: Normal range of motion.     Left foot: Normal range of motion. No swelling, deformity or tenderness. Normal pulse.  Skin:    General: Skin is warm and dry.  Neurological:     General: No focal deficit present.     Mental Status: She is alert and  oriented to person, place, and time. Mental status is at baseline.  Psychiatric:        Mood and Affect: Mood normal.        Behavior: Behavior normal.        Thought Content: Thought content normal.        Judgment: Judgment normal.      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  Dispense: 21 tablet; Refill: 0  -Congratulated patient on losing weight. Continue with Vyvanse  50mg  daily. Advised when she got a week out from needing a refill, please send a MyChart message to provide  an update on how she is tolerating medication and if she would like to increase dose to 70mg  daily.  -Prescribed Prednisone  10mg  tablet, 6 day taper for left foot pain. Advised to please do not take any NSAIDS while taking medication, but can still take Tylenol  for pain. If not improved, please send a MyChart message and will refer to podiatry.  -Follow up in 3 months.   Return in about 3 months (around 07/01/2024) for chronic management.   Dracen Reigle, NP

## 2024-05-07 ENCOUNTER — Encounter: Payer: Self-pay | Admitting: Family Medicine

## 2024-05-07 DIAGNOSIS — E661 Drug-induced obesity: Secondary | ICD-10-CM

## 2024-05-07 DIAGNOSIS — R632 Polyphagia: Secondary | ICD-10-CM

## 2024-05-09 ENCOUNTER — Other Ambulatory Visit: Payer: Self-pay | Admitting: Family Medicine

## 2024-05-09 ENCOUNTER — Other Ambulatory Visit: Payer: Self-pay

## 2024-05-09 DIAGNOSIS — R632 Polyphagia: Secondary | ICD-10-CM

## 2024-05-09 DIAGNOSIS — E661 Drug-induced obesity: Secondary | ICD-10-CM

## 2024-05-09 MED ORDER — LISDEXAMFETAMINE DIMESYLATE 70 MG PO CAPS
70.0000 mg | ORAL_CAPSULE | Freq: Every day | ORAL | 0 refills | Status: DC
  Filled 2024-05-09: qty 30, 30d supply, fill #0

## 2024-05-09 MED ORDER — LISDEXAMFETAMINE DIMESYLATE 70 MG PO CAPS
70.0000 mg | ORAL_CAPSULE | Freq: Every day | ORAL | 0 refills | Status: DC
Start: 2024-05-09 — End: 2024-05-09

## 2024-05-09 NOTE — Progress Notes (Signed)
 Patient has sent a message wanting to increase medication to 70mg  from 50mg . Discussed about this at her last visit. PDMP reviewed. Last refill on 05/07. UDS and controlled substance contract UTD.

## 2024-05-09 NOTE — Progress Notes (Signed)
 Please see note from previous encounter today. Send to wrong pharmacy.

## 2024-05-11 ENCOUNTER — Other Ambulatory Visit: Payer: Self-pay

## 2024-05-21 ENCOUNTER — Other Ambulatory Visit: Payer: Self-pay | Admitting: Family Medicine

## 2024-05-21 DIAGNOSIS — I10 Essential (primary) hypertension: Secondary | ICD-10-CM

## 2024-06-20 ENCOUNTER — Other Ambulatory Visit: Payer: Self-pay | Admitting: Family Medicine

## 2024-06-20 DIAGNOSIS — F32A Depression, unspecified: Secondary | ICD-10-CM

## 2024-06-21 ENCOUNTER — Other Ambulatory Visit: Payer: Self-pay | Admitting: Family Medicine

## 2024-06-21 DIAGNOSIS — R1013 Epigastric pain: Secondary | ICD-10-CM

## 2024-07-03 ENCOUNTER — Ambulatory Visit: Admitting: Family Medicine

## 2024-07-07 ENCOUNTER — Ambulatory Visit: Admitting: Family Medicine

## 2024-07-07 VITALS — BP 124/82 | HR 82 | Temp 97.6°F | Ht 62.0 in | Wt 183.0 lb

## 2024-07-07 DIAGNOSIS — J302 Other seasonal allergic rhinitis: Secondary | ICD-10-CM

## 2024-07-07 DIAGNOSIS — K2100 Gastro-esophageal reflux disease with esophagitis, without bleeding: Secondary | ICD-10-CM

## 2024-07-07 DIAGNOSIS — R5383 Other fatigue: Secondary | ICD-10-CM

## 2024-07-07 DIAGNOSIS — D509 Iron deficiency anemia, unspecified: Secondary | ICD-10-CM

## 2024-07-07 DIAGNOSIS — Z1211 Encounter for screening for malignant neoplasm of colon: Secondary | ICD-10-CM

## 2024-07-07 DIAGNOSIS — A6009 Herpesviral infection of other urogenital tract: Secondary | ICD-10-CM | POA: Diagnosis not present

## 2024-07-07 DIAGNOSIS — R632 Polyphagia: Secondary | ICD-10-CM

## 2024-07-07 DIAGNOSIS — F32A Depression, unspecified: Secondary | ICD-10-CM

## 2024-07-07 DIAGNOSIS — E559 Vitamin D deficiency, unspecified: Secondary | ICD-10-CM

## 2024-07-07 DIAGNOSIS — I10 Essential (primary) hypertension: Secondary | ICD-10-CM | POA: Diagnosis not present

## 2024-07-07 MED ORDER — VALACYCLOVIR HCL 1 G PO TABS: ORAL_TABLET | ORAL | 0 refills | Status: DC

## 2024-07-07 NOTE — Assessment & Plan Note (Signed)
 Stable. Continue with Pantoprazole  40mg  daily

## 2024-07-07 NOTE — Assessment & Plan Note (Signed)
 Controlled. Continue Valtrex 1000mg  daily. Refilled medication.

## 2024-07-07 NOTE — Assessment & Plan Note (Signed)
 Ordered vitamin D  level. Feeling fatigue.

## 2024-07-07 NOTE — Assessment & Plan Note (Signed)
 Stable. Continue with Amlodipine 5mg  daily. Refilled medication. Ordered CMP to assess kidney function.

## 2024-07-07 NOTE — Progress Notes (Signed)
 Established Patient Office Visit   Subjective:  Patient ID: Beth Frost, female    DOB: December 06, 1970  Age: 53 y.o. MRN: [id number]  Chief Complaint  Patient presents with   Medical Management of Chronic Issues    3 month follow up    HPI HTN: Chronic. Patient is taking Amlodipine  5mg  tablet. Patient is monitoring her blood pressure at work. Usually around 130s/70-80s. Denies CP, SHOB, HA, dizziness, lightheadedness, or lower extremity edema.    BP Readings from Last 3 Encounters:  07/07/24 124/82  03/31/24 120/74  02/04/24 124/86    Depression: Chronic. Patient is taking Citalopram  20mg  daily. Patient is having more depressed, anxious, irritable, and having brain fog. Previously increased Citalopram  to 30mg  and 40mg . She reports she believes the 30mg  helped at one time, but thought the 40mg  was too much.   Seasonal allergies: Chronic. Patient is using Azelastine  nasal spray as needed, usually uses it more spring and some in the winter months; uses Cetirizine  10mg  during the spring, not used in a while; and uses Flonase  nasal spray.    GERD: Chronic. Patient is taking Pantoprazole  40mg  daily. Effective.     Herpes genital: Patient is taking Valtrex  1000mg  daily to prevent outbreaks. Not had an out break recently.   Binge eating: Patient was taking Lisdexamfetamine 70mg  daily for binge eating and weight loss. Stopped taking it about 3 weeks ago due to having her grandchildren home. But she is going to restart taking it. She can tell a difference with not taking medication.  Wt Readings from Last 3 Encounters:  07/07/24 183 lb (83 kg)  03/31/24 187 lb (84.8 kg)  02/04/24 202 lb (91.6 kg)    ROS See HPI above     Objective:   BP 124/82   Pulse 82   Temp 97.6 F (36.4 C) (Oral)   Ht 5' 2 (1.575 m)   Wt 183 lb (83 kg)   SpO2 99%   BMI 33.47 kg/m    Physical Exam Vitals reviewed.  Constitutional:      General: She is not in acute distress.    Appearance: Normal  appearance. She is obese. She is not ill-appearing, toxic-appearing or diaphoretic.  HENT:     Head: Normocephalic and atraumatic.  Eyes:     General:        Right eye: No discharge.        Left eye: No discharge.     Conjunctiva/sclera: Conjunctivae normal.  Cardiovascular:     Rate and Rhythm: Normal rate and regular rhythm.     Heart sounds: No murmur heard.    No friction rub. No gallop.  Pulmonary:     Effort: Pulmonary effort is normal. No respiratory distress.     Breath sounds: Normal breath sounds.  Musculoskeletal:        General: Normal range of motion.  Skin:    General: Skin is warm and dry.  Neurological:     General: No focal deficit present.     Mental Status: She is alert and oriented to person, place, and time. Mental status is at baseline.     Motor: No weakness.     Gait: Gait normal.  Psychiatric:        Mood and Affect: Mood normal.        Behavior: Behavior normal.        Thought Content: Thought content normal.        Judgment: Judgment normal.      Assessment &  Plan:  Essential hypertension Assessment & Plan: Stable. Continue with Amlodipine  5mg  daily. Refilled medication. Ordered CMP to assess kidney function.   Orders: -     Comprehensive metabolic panel with GFR  Depression, unspecified depression type Assessment & Plan: Not controlled according to patient . Scored 5 on PHQ-9 screening and 5 on GAD 7 screening. Continue Citalopram  20mg  daily. May take Ashwagandha for stress and anxiety.      Seasonal allergies Assessment & Plan: Stable. Continue Azelastine  nasal spray, Cetirizine , and Flonase  nasal spray.     Herpes genitalis in women Assessment & Plan: Controlled. Continue Valtrex  1000mg  daily. Refilled medication.     Orders: -     valACYclovir  HCl; TAKE 1 TABLET BY MOUTH DAILY AT ONSET OF OUTBREAK  Dispense: 90 tablet; Refill: 0  Vitamin D  deficiency Assessment & Plan: Ordered vitamin D  level. Feeling fatigue.  Orders: -      VITAMIN D  25 Hydroxy (Vit-D Deficiency, Fractures)  Iron deficiency anemia, unspecified iron deficiency anemia type -     Iron, TIBC and Ferritin Panel -     CBC with Differential/Platelet  Other fatigue Assessment & Plan: Ordered vitamin D /B12, TSH, CMP, CBC, and iron panel for a reason of symptoms.   Orders: -     VITAMIN D  25 Hydroxy (Vit-D Deficiency, Fractures) -     Vitamin B12 -     TSH -     Comprehensive metabolic panel with GFR -     CBC with Differential/Platelet  Binge eating -     DRUG MONITOR, PANEL 1, SCREEN, URINE  Colon cancer screening -     Cologuard  Gastroesophageal reflux disease with esophagitis, unspecified whether hemorrhage Assessment & Plan: Stable. Continue with Pantoprazole  40mg  daily    1.Review health maintenance:  -Cologuard: Order  -Covid booster: Declines  -Tdap: Declines today  -Hep B vaccine: Declines today  -Influenza vaccine: With employer  -PNA vaccine: Declines today  -Zoster vaccine: Declines today  -May schedule a nurse visit to have vaccines provided.  2.May restart taking Vyvanse  70mg  daily for binge eating. Urine drug screen ordered. Controlled substance agreement is UTD.   Return in about 3 months (around 10/07/2024) for chronic management.   Adelfa Lozito, NP

## 2024-07-07 NOTE — Patient Instructions (Addendum)
-  It was great to see you today! -You may get your Tdap, Hep B, pneumonia, or zoster vaccine at anytime. Just need a nurse visit.  -Ordered cologuard for colon cancer screening. -Ordered labs today. Office will call with lab results and will be available on MyChart.  -Recommend to restart back on Vyvanse .  -May try Ashwagandha for stress and anxiety.  -Follow up in 3 months.

## 2024-07-07 NOTE — Assessment & Plan Note (Signed)
 Stable. Continue Azelastine  nasal spray, Cetirizine , and Flonase  nasal spray.

## 2024-07-07 NOTE — Assessment & Plan Note (Signed)
 Ordered vitamin D /B12, TSH, CMP, CBC, and iron panel for a reason of symptoms.

## 2024-07-07 NOTE — Assessment & Plan Note (Signed)
 Not controlled according to patient . Scored 5 on PHQ-9 screening and 5 on GAD 7 screening. Continue Citalopram  20mg  daily. May take Ashwagandha for stress and anxiety.

## 2024-07-08 LAB — CBC WITH DIFFERENTIAL/PLATELET
Absolute Lymphocytes: 2405 {cells}/uL (ref 850–3900)
Absolute Monocytes: 605 {cells}/uL (ref 200–950)
Basophils Absolute: 50 {cells}/uL (ref 0–200)
Basophils Relative: 0.7 %
Eosinophils Absolute: 43 {cells}/uL (ref 15–500)
Eosinophils Relative: 0.6 %
HCT: 38.2 % (ref 35.0–45.0)
Hemoglobin: 12.3 g/dL (ref 11.7–15.5)
MCH: 27.8 pg (ref 27.0–33.0)
MCHC: 32.2 g/dL (ref 32.0–36.0)
MCV: 86.4 fL (ref 80.0–100.0)
MPV: 10.7 fL (ref 7.5–12.5)
Monocytes Relative: 8.4 %
Neutro Abs: 4097 {cells}/uL (ref 1500–7800)
Neutrophils Relative %: 56.9 %
Platelets: 326 Thousand/uL (ref 140–400)
RBC: 4.42 Million/uL (ref 3.80–5.10)
RDW: 14.2 % (ref 11.0–15.0)
Total Lymphocyte: 33.4 %
WBC: 7.2 Thousand/uL (ref 3.8–10.8)

## 2024-07-08 LAB — IRON,TIBC AND FERRITIN PANEL
%SAT: 13 % — ABNORMAL LOW (ref 16–45)
Ferritin: 11 ng/mL — ABNORMAL LOW (ref 16–232)
Iron: 66 ug/dL (ref 45–160)
TIBC: 496 ug/dL — ABNORMAL HIGH (ref 250–450)

## 2024-07-08 LAB — COMPREHENSIVE METABOLIC PANEL WITH GFR
AG Ratio: 1.4 (calc) (ref 1.0–2.5)
ALT: 9 U/L (ref 6–29)
AST: 15 U/L (ref 10–35)
Albumin: 4.4 g/dL (ref 3.6–5.1)
Alkaline phosphatase (APISO): 64 U/L (ref 37–153)
BUN/Creatinine Ratio: 17 (calc) (ref 6–22)
BUN: 8 mg/dL (ref 7–25)
CO2: 27 mmol/L (ref 20–32)
Calcium: 9.6 mg/dL (ref 8.6–10.4)
Chloride: 100 mmol/L (ref 98–110)
Creat: 0.47 mg/dL — ABNORMAL LOW (ref 0.50–1.03)
Globulin: 3.2 g/dL (ref 1.9–3.7)
Glucose, Bld: 85 mg/dL (ref 65–99)
Potassium: 3.7 mmol/L (ref 3.5–5.3)
Sodium: 136 mmol/L (ref 135–146)
Total Bilirubin: 1.2 mg/dL (ref 0.2–1.2)
Total Protein: 7.6 g/dL (ref 6.1–8.1)
eGFR: 115 mL/min/1.73m2 (ref >=60)

## 2024-07-08 LAB — DRUG MONITOR, PANEL 1, SCREEN, URINE
Amphetamines: NEGATIVE ng/mL (ref <500)
Barbiturates: NEGATIVE ng/mL (ref <300)
Benzodiazepines: NEGATIVE ng/mL (ref <100)
Cocaine Metabolite: NEGATIVE ng/mL (ref <150)
Creatinine: 98.6 mg/dL (ref >=20.0)
Marijuana Metabolite: NEGATIVE ng/mL (ref <20)
Methadone Metabolite: NEGATIVE ng/mL (ref <100)
Opiates: NEGATIVE ng/mL (ref <100)
Oxidant: NEGATIVE ug/mL (ref <200)
Oxycodone: NEGATIVE ng/mL (ref <100)
Phencyclidine: NEGATIVE ng/mL (ref <25)
pH: 6.7 (ref 4.5–9.0)

## 2024-07-08 LAB — VITAMIN D 25 HYDROXY (VIT D DEFICIENCY, FRACTURES): Vit D, 25-Hydroxy: 39 ng/mL (ref 30–100)

## 2024-07-08 LAB — DM TEMPLATE

## 2024-07-08 LAB — TSH: TSH: 1.68 m[IU]/L

## 2024-07-08 LAB — VITAMIN B12: Vitamin B-12: 369 pg/mL (ref 200–1100)

## 2024-07-10 ENCOUNTER — Ambulatory Visit: Payer: Self-pay | Admitting: Family Medicine

## 2024-07-10 DIAGNOSIS — D509 Iron deficiency anemia, unspecified: Secondary | ICD-10-CM

## 2024-07-10 DIAGNOSIS — Z1211 Encounter for screening for malignant neoplasm of colon: Secondary | ICD-10-CM

## 2024-07-20 ENCOUNTER — Other Ambulatory Visit: Payer: Self-pay | Admitting: Family Medicine

## 2024-07-20 ENCOUNTER — Other Ambulatory Visit: Payer: Self-pay

## 2024-07-20 DIAGNOSIS — R632 Polyphagia: Secondary | ICD-10-CM

## 2024-07-20 DIAGNOSIS — E66811 Obesity, class 1: Secondary | ICD-10-CM

## 2024-07-20 MED ORDER — LISDEXAMFETAMINE DIMESYLATE 70 MG PO CAPS
70.0000 mg | ORAL_CAPSULE | Freq: Every day | ORAL | 0 refills | Status: DC
  Filled 2024-07-20: qty 30, 30d supply, fill #0

## 2024-07-20 NOTE — Progress Notes (Signed)
 Refill Vyvanse . PDMP reviwed. Last refill 05/10/2024. UDS and controlled substance contract UTD.

## 2024-07-21 ENCOUNTER — Other Ambulatory Visit: Payer: Self-pay

## 2024-07-25 ENCOUNTER — Other Ambulatory Visit: Payer: Self-pay

## 2024-07-25 DIAGNOSIS — A6009 Herpesviral infection of other urogenital tract: Secondary | ICD-10-CM

## 2024-07-25 MED ORDER — VALACYCLOVIR HCL 1 G PO TABS: ORAL_TABLET | ORAL | 0 refills | Status: AC

## 2024-07-28 LAB — COLOGUARD

## 2024-07-31 NOTE — Addendum Note (Signed)
 Addended by: ELNER NANNY B on: 07/31/2024 04:47 PM   Modules accepted: Orders

## 2024-08-18 ENCOUNTER — Ambulatory Visit: Admitting: Family Medicine

## 2024-08-23 ENCOUNTER — Inpatient Hospital Stay

## 2024-08-23 ENCOUNTER — Inpatient Hospital Stay: Attending: Hematology | Admitting: Hematology

## 2024-08-23 VITALS — BP 136/82 | HR 73 | Temp 97.0°F | Resp 20 | Wt 182.3 lb

## 2024-08-23 DIAGNOSIS — D500 Iron deficiency anemia secondary to blood loss (chronic): Secondary | ICD-10-CM

## 2024-08-23 DIAGNOSIS — D509 Iron deficiency anemia, unspecified: Secondary | ICD-10-CM | POA: Insufficient documentation

## 2024-08-23 LAB — CMP (CANCER CENTER ONLY)
ALT: 9 U/L (ref 0–44)
AST: 14 U/L — ABNORMAL LOW (ref 15–41)
Albumin: 4.2 g/dL (ref 3.5–5.0)
Alkaline Phosphatase: 68 U/L (ref 38–126)
Anion gap: 4 — ABNORMAL LOW (ref 5–15)
BUN: 9 mg/dL (ref 6–20)
CO2: 29 mmol/L (ref 22–32)
Calcium: 9 mg/dL (ref 8.9–10.3)
Chloride: 104 mmol/L (ref 98–111)
Creatinine: 0.54 mg/dL (ref 0.44–1.00)
GFR, Estimated: >60 mL/min (ref >60)
Glucose, Bld: 81 mg/dL (ref 70–99)
Potassium: 3.8 mmol/L (ref 3.5–5.1)
Sodium: 137 mmol/L (ref 135–145)
Total Bilirubin: 0.7 mg/dL (ref 0.0–1.2)
Total Protein: 7.3 g/dL (ref 6.5–8.1)

## 2024-08-23 LAB — IRON AND IRON BINDING CAPACITY (CC-WL,HP ONLY)
Iron: 63 ug/dL (ref 28–170)
Saturation Ratios: 13 % (ref 10.4–31.8)
TIBC: 486 ug/dL — ABNORMAL HIGH (ref 250–450)
UIBC: 423 ug/dL (ref 148–442)

## 2024-08-23 LAB — CBC WITH DIFFERENTIAL (CANCER CENTER ONLY)
Abs Immature Granulocytes: 0.01 K/uL (ref 0.00–0.07)
Basophils Absolute: 0.1 K/uL (ref 0.0–0.1)
Basophils Relative: 1 %
Eosinophils Absolute: 0.1 K/uL (ref 0.0–0.5)
Eosinophils Relative: 1 %
HCT: 37 % (ref 36.0–46.0)
Hemoglobin: 13.4 g/dL (ref 12.0–15.0)
Immature Granulocytes: 0 %
Lymphocytes Relative: 31 %
Lymphs Abs: 2.3 K/uL (ref 0.7–4.0)
MCH: 28.5 pg (ref 26.0–34.0)
MCHC: 36.2 g/dL — ABNORMAL HIGH (ref 30.0–36.0)
MCV: 78.6 fL — ABNORMAL LOW (ref 80.0–100.0)
Monocytes Absolute: 0.6 K/uL (ref 0.1–1.0)
Monocytes Relative: 8 %
Neutro Abs: 4.3 K/uL (ref 1.7–7.7)
Neutrophils Relative %: 59 %
Platelet Count: 329 K/uL (ref 150–400)
RBC: 4.71 MIL/uL (ref 3.87–5.11)
RDW: 14.5 % (ref 11.5–15.5)
WBC Count: 7.3 K/uL (ref 4.0–10.5)
nRBC: 0 % (ref 0.0–0.2)

## 2024-08-23 LAB — FERRITIN: Ferritin: 25 ng/mL (ref 11–307)

## 2024-08-23 NOTE — Progress Notes (Signed)
 HEMATOLOGY/ONCOLOGY CONSULTATION NOTE  Date of Service: 08/23/2024  Patient Care Team: Billy Philippe SAUNDERS, NP as PCP - General (Family Medicine)  CHIEF COMPLAINTS/PURPOSE OF CONSULTATION:  Iron deficiency anemia   HISTORY OF PRESENTING ILLNESS:   Beth Frost is a wonderful 53 y.o. female who has been referred to us  by Dr .Billy, Philippe SAUNDERS, NP  for evaluation and management of Iron deficiency anemia. She states she is doing well but feels tired and has a significant lack of energy and fatigue. Lacks energy to do daily task. She states Vitamin D  was low and she is on replaceement for this. 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 She lacks energy following work after being on feet all day and needs rest.  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   No stomach ulcers or tylenol  medications than can cause bleedings  MEDICAL HISTORY:  Past Medical History:  Diagnosis Date   Abnormal Pap smear    many yrs ago, 2020 LGSIL   Anxiety    Asthma    Depression    Fibroids    GERD (gastroesophageal reflux disease)    High blood pressure    Hypertension    Palpitations    STD (sexually transmitted disease)    HSV2   Vaginal wall cyst    Vitamin D  deficiency     SURGICAL  HISTORY: Past Surgical History:  Procedure Laterality Date   CHOLECYSTECTOMY N/A 05/11/2014   Procedure: LAPAROSCOPIC CHOLECYSTECTOMY WITH INTRAOPERATIVE CHOLANGIOGRAM;  Surgeon: Dann FORBES Hummer, MD;  Location: [facility] OR;  Service: General;  Laterality: N/A;   CHOLECYSTECTOMY  04/2014   COLPOSCOPY     CRYOTHERAPY     for abnormal pap   DILATION AND CURETTAGE OF UTERUS     LAPAROSCOPIC APPENDECTOMY N/A 11/05/2017   Procedure: APPENDECTOMY LAPAROSCOPIC;  Surgeon: Vernetta Berg, MD;  Location: [facility] OR;  Service: General;  Laterality: N/A;    SOCIAL HISTORY: Social History   Socioeconomic History   Marital status: Married    Spouse name: Not on file   Number of children: Not on file   Years of education: Not on file   Highest education level: 12th grade  Occupational History   Not on file  Tobacco Use   Smoking status: Never   Smokeless tobacco: Never  Vaping Use   Vaping status: Never Used  Substance and Sexual Activity   Alcohol use: Yes    Comment: 1 a day   Drug use: No   Sexual activity: Yes    Partners: Male    Birth control/protection: Pill  Other Topics Concern   Not on file  Social History  Narrative   Not on file   Social Drivers of Health   Financial Resource Strain: Low Risk  (08/21/2024)   Overall Financial Resource Strain (CARDIA)    Difficulty of Paying Living Expenses: Not hard at all  Food Insecurity: No Food Insecurity (08/21/2024)   Hunger Vital Sign    Worried About Running Out of Food in the Last Year: Never true    Ran Out of Food in the Last Year: Never true  Transportation Needs: No Transportation Needs (08/21/2024)   PRAPARE - Administrator, Civil Service (Medical): No    Lack of Transportation (Non-Medical): No  Physical Activity: Inactive (08/21/2024)   Exercise Vital Sign    Days of Exercise per Week: 0 days    Minutes of Exercise per Session: Not on file  Stress: Stress Concern Present (08/21/2024)   [employer] of  Occupational Health - Occupational Stress Questionnaire    Feeling of Stress: Very much  Social Connections: Moderately Integrated (08/21/2024)   Social Connection and Isolation Panel    Frequency of Communication with Friends and Family: More than three times a week    Frequency of Social Gatherings with Friends and Family: Once a week    Attends Religious Services: More than 4 times per year    Active Member of [employer] or Organizations: No    Attends Engineer, structural: Not on file    Marital Status: Married  Catering manager Violence: Not on file    FAMILY HISTORY: Family History  Problem Relation Age of Onset   Diabetes Mother    Cancer Mother        leukemia   Lupus Mother    Hypertension Mother    High Cholesterol Mother    Stroke Mother    Depression Mother    Anxiety disorder Mother    Hypertension Father    Diabetes Father    Alcohol abuse Father    Breast cancer Maternal Aunt     ALLERGIES:  is allergic to flagyl  [metronidazole ].  MEDICATIONS:  Current Outpatient Medications  Medication Sig Dispense Refill   albuterol  (VENTOLIN  HFA) 108 (90 Base) MCG/ACT inhaler Inhale 2 puffs into the lungs every 6 (six) hours as needed for wheezing or shortness of breath. 18 g 0   amLODipine  (NORVASC ) 5 MG tablet TAKE 1 TABLET(5 MG) BY MOUTH DAILY 90 tablet 0   azelastine  (ASTELIN ) 0.1 % nasal spray Place 1 spray into both nostrils 2 (two) times daily. Use in each nostril as directed 30 mL 12   cetirizine  (ZYRTEC ) 10 MG tablet Take 1 tablet (10 mg total) by mouth at bedtime for 90 doses. 90 tablet 0   citalopram  (CELEXA ) 20 MG tablet TAKE 1 TABLET(20 MG) BY MOUTH DAILY 30 tablet 2   fluticasone  (FLONASE ) 50 MCG/ACT nasal spray Place 1 spray into both nostrils daily. 48 g 0   lisdexamfetamine (VYVANSE ) 70 MG capsule Take 1 capsule (70 mg total) by mouth daily. 30 capsule 0   norethindrone  (MICRONOR ) 0.35 MG tablet Take 1 tablet (0.35 mg total) by mouth daily. 84 tablet 3    pantoprazole  (PROTONIX ) 40 MG tablet TAKE 1 TABLET(40 MG) BY MOUTH DAILY 90 tablet 0   triamcinolone  cream (KENALOG ) 0.1 % Apply 1 Application topically 2 (two) times daily. 30 g 0   valACYclovir  (VALTREX ) 1000 MG tablet TAKE 1 TABLET BY MOUTH DAILY AT ONSET OF OUTBREAK 90 tablet 0   No current facility-administered medications for this visit.    REVIEW  OF SYSTEMS:    10 Point review of Systems was done is negative except as noted above.  PHYSICAL EXAMINATION: ECOG PERFORMANCE STATUS: 1 - Symptomatic but completely ambulatory  . Vitals:   08/23/24 1055  BP: 136/82  Pulse: 73  Resp: 20  Temp: (!) 97 F (36.1 C)  SpO2: 100%   Filed Weights   08/23/24 1055  Weight: 182 lb 4.8 oz (82.7 kg)   .Body mass index is 33.34 kg/m.  GENERAL:alert, in no acute distress and comfortable SKIN: no acute rashes, no significant lesions EYES: conjunctiva are pink and non-injected, sclera anicteric OROPHARYNX: MMM, no exudates, no oropharyngeal erythema or ulceration NECK: supple, no JVD LYMPH:  no palpable lymphadenopathy in the cervical, axillary or inguinal regions LUNGS: clear to auscultation b/l with normal respiratory effort HEART: regular rate & rhythm ABDOMEN:  normoactive bowel sounds , non tender, not distended. Extremity: no pedal edema PSYCH: alert & oriented x 3 with fluent speech NEURO: no focal motor/sensory deficits  LABORATORY DATA:  I have reviewed the data as listed  .    Latest Ref Rng & Units 08/23/2024   11:49 AM 07/07/2024    3:38 PM 01/27/2024   10:27 AM  CBC  WBC 4.0 - 10.5 K/uL 7.3  7.2  7.2   Hemoglobin 12.0 - 15.0 g/dL 86.5  87.6  86.9   Hematocrit 36.0 - 46.0 % 37.0  38.2  38.7   Platelets 150 - 400 K/uL 329  326  338.0    .CBC    Component Value Date/Time   WBC 7.3 08/23/2024 1149   WBC 7.2 07/07/2024 1538   RBC 4.71 08/23/2024 1149   HGB 13.4 08/23/2024 1149   HGB 13.0 10/26/2014 0000   HCT 37.0 08/23/2024 1149   PLT 329 08/23/2024 1149    MCV 78.6 (L) 08/23/2024 1149   MCV 89.0 02/19/2020 1406   MCH 28.5 08/23/2024 1149   MCHC 36.2 (H) 08/23/2024 1149   RDW 14.5 08/23/2024 1149   LYMPHSABS 2.3 08/23/2024 1149   MONOABS 0.6 08/23/2024 1149   EOSABS 0.1 08/23/2024 1149   BASOSABS 0.1 08/23/2024 1149     .    Latest Ref Rng & Units 08/23/2024   11:49 AM 07/07/2024    3:38 PM 01/27/2024   10:27 AM  CMP  Glucose 70 - 99 mg/dL 81  85  90   BUN 6 - 20 mg/dL 9  8  8    Creatinine 0.44 - 1.00 mg/dL 9.45  9.52  9.50   Sodium 135 - 145 mmol/L 137  136  138   Potassium 3.5 - 5.1 mmol/L 3.8  3.7  3.6   Chloride 98 - 111 mmol/L 104  100  104   CO2 22 - 32 mmol/L 29  27  28    Calcium 8.9 - 10.3 mg/dL 9.0  9.6  8.9   Total Protein 6.5 - 8.1 g/dL 7.3  7.6  7.2   Total Bilirubin 0.0 - 1.2 mg/dL 0.7  1.2  1.0   Alkaline Phos 38 - 126 U/L 68   67   AST 15 - 41 U/L 14  15  17    ALT 0 - 44 U/L 9  9  13     . Lab Results  Component Value Date   IRON 63 08/23/2024   TIBC 486 (H) 08/23/2024   IRONPCTSAT 13 08/23/2024   (Iron and TIBC)  Lab Results  Component Value Date   FERRITIN 25 08/23/2024   Component  Latest Ref Rng 08/23/2024  HGB F     0.0 - 2.0 % Reflexed   HGB F     0.0 - 2.0 % 0.0   Hgb A     96.4 - 98.8 % Reflexed   Hgb A     96.4 - 98.8 % 64.6 (L)   HGB A2     1.8 - 3.2 % 3.2   HGB S     0.0 % Reflexed   HGB S     0.0 % 0.0   HGB C     0.0 % 32.2 (H)   Hgb E     0.0 % 0.0   HGB VARIANT     0.0 % 0.0   Final Interpretation: Comment   Hgb A2     1.8 - 3.2 % Reflexed   Interpretation, Hgb Fract Comment   Iron     28 - 170 ug/dL 63   TIBC     749 - 549 ug/dL 513 (H)   Saturation Ratios     10.4 - 31.8 % 13   UIBC     148 - 442 ug/dL 576   Intrinsic Factor     0.0 - 1.1 AU/mL 1.1   Parietal Cell Antibody-IgG     0.0 - 20.0 Units 34.4 (H)   Ferritin     11 - 307 ng/mL 25     Legend: (L) Low (H) High  RADIOGRAPHIC STUDIES: I have personally reviewed the radiological images as listed  and agreed with the findings in the report. No results found.  ASSESSMENT & PLAN:   53 y.o. 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 Ferritin is 25 with an iron saturation of 13% Patient does not have significant anemia.  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 Can cause some mild low normal hemoglobin levels. All of the patients questions were answered with apparent satisfaction. The patient knows to call the clinic with any problems, questions or concerns.  PLAN - Lab workup was sent out to evaluate the patient's microcytic anemia including iron labs and hemoglobin electrophoresis. Patient was known to have hemoglobin C heterozygous state, iron deficiency and pernicious anemia with antiparietal cell antibodies. - She may not absorb her p.o. iron well. - Will give patient option for IV iron to replace her ferritin 200 with an iron saturation of more than 20% Reasonable to do IV iron since she may not absorb p.o. iron well-related to pernicious anemia. - PCP should consider GI workup for evaluation of iron deficiency anemia . Follow-up Labs today IV Iron at [facility] [address] Return to clinic with Dr. Onesimo with labs in 3 months . Orders Placed This Encounter  Procedures   CBC with Differential ([facility] Center Only)    Standing Status:   Future    Number of Occurrences:   1    Expected Date:   08/23/2024    Expiration Date:   08/23/2025   CMP (Cancer Center only)    Standing Status:   Future    Number of Occurrences:   1    Expected Date:   08/23/2024    Expiration Date:   08/23/2025   Ferritin    Standing Status:   Future    Number of Occurrences:  1    Expected Date:   08/23/2024    Expiration Date:   08/23/2025   Iron and Iron  Binding Capacity ([facility] only)    Standing Status:   Future    Number of Occurrences:   1    Expected Date:   08/23/2024    Expiration Date:   08/23/2025   Hgb Fractionation Cascade    Standing Status:   Future    Number of Occurrences:   1    Expected Date:   08/23/2024    Expiration Date:   08/23/2025   Anti-parietal antibody    Standing Status:   Future    Number of Occurrences:   1    Expected Date:   08/23/2024    Expiration Date:   08/23/2025   Intrinsic factor antibodies    Standing Status:   Future    Number of Occurrences:   1    Expected Date:   08/23/2024    Expiration Date:   08/23/2025  The total time spent in the appointment was 60 minutes*.  All of the patient's questions were answered with apparent satisfaction. The patient knows to call the clinic with any problems, questions or concerns.   Emaline Saran MD MS AAHIVMS [facility] Hematology/Oncology Physician [facility]  .*Total Encounter Time as defined by the Centers for Medicare and Medicaid Services includes, in addition to the face-to-face time of a patient visit (documented in the note above) non-face-to-face time: obtaining and reviewing outside history, ordering and reviewing medications, tests or procedures, care coordination (communications with other health care professionals or caregivers) and documentation in the medical record.   08/23/2024 11:03 AM

## 2024-08-24 LAB — ANTI-PARIETAL ANTIBODY: Parietal Cell Antibody-IgG: 34.4 U — ABNORMAL HIGH (ref 0.0–20.0)

## 2024-08-24 LAB — INTRINSIC FACTOR ANTIBODIES: Intrinsic Factor: 1.1 [AU]/ml (ref 0.0–1.1)

## 2024-08-25 ENCOUNTER — Encounter: Payer: Self-pay | Admitting: Family Medicine

## 2024-08-25 ENCOUNTER — Ambulatory Visit: Admitting: Family Medicine

## 2024-08-25 VITALS — BP 124/78 | HR 100 | Temp 98.1°F | Ht 62.0 in | Wt 183.0 lb

## 2024-08-25 DIAGNOSIS — E6609 Other obesity due to excess calories: Secondary | ICD-10-CM

## 2024-08-25 DIAGNOSIS — F32A Depression, unspecified: Secondary | ICD-10-CM | POA: Diagnosis not present

## 2024-08-25 DIAGNOSIS — E66811 Obesity, class 1: Secondary | ICD-10-CM

## 2024-08-25 DIAGNOSIS — I10 Essential (primary) hypertension: Secondary | ICD-10-CM

## 2024-08-25 DIAGNOSIS — Z6833 Body mass index (BMI) 33.0-33.9, adult: Secondary | ICD-10-CM

## 2024-08-25 DIAGNOSIS — R632 Polyphagia: Secondary | ICD-10-CM

## 2024-08-25 MED ORDER — CITALOPRAM HYDROBROMIDE 20 MG PO TABS: 20.0000 mg | ORAL_TABLET | Freq: Every day | ORAL | 1 refills | Status: AC

## 2024-08-25 MED ORDER — AMLODIPINE BESYLATE 5 MG PO TABS: 5.0000 mg | ORAL_TABLET | Freq: Every day | ORAL | 1 refills | Status: AC

## 2024-08-25 NOTE — Progress Notes (Signed)
   Established Patient Office Visit   Subjective:  Patient ID: Beth Frost, female    DOB: 08-24-1971  Age: 53 y.o. MRN: [id number]  Chief Complaint  Patient presents with   Medical Management of Chronic Issues    6 week follow up     HPI HTN: Chronic. Patient is taking Amlodipine  5mg  tablet. Patient is requesting a refill. She reports she has been out of medication for 2 days.  BP Readings from Last 3 Encounters:  08/25/24 124/78  08/23/24 136/82  07/07/24 124/82     Depression: Chronic. Patient is taking Citalopram  20mg  daily. She is requesting a refill.    Binge eating: Patient was taking Lisdexamfetamine 70mg  daily for binge eating and weight loss. She has restarted medication since last visit. She reports she is doing good on medication. Weight is about the same.   She has seen Dr. Emaline Saran at [facility] for iron deficiency anemia 10/01. She had labs drawn, waiting results.   ROS See HPI above     Objective:   BP 124/78   Pulse 100   Temp 98.1 F (36.7 C) (Oral)   Ht 5' 2 (1.575 m)   Wt 183 lb (83 kg)   SpO2 99%   BMI 33.47 kg/m  Wt Readings from Last 3 Encounters:  08/25/24 183 lb (83 kg)  08/23/24 182 lb 4.8 oz (82.7 kg)  07/07/24 183 lb (83 kg)     Physical Exam Vitals reviewed.  Constitutional:      General: She is not in acute distress.    Appearance: Normal appearance. She is obese. She is not ill-appearing, toxic-appearing or diaphoretic.  HENT:     Head: Normocephalic and atraumatic.  Eyes:     General:        Right eye: No discharge.        Left eye: No discharge.     Conjunctiva/sclera: Conjunctivae normal.  Cardiovascular:     Rate and Rhythm: Normal rate.  Pulmonary:     Effort: Pulmonary effort is normal. No respiratory distress.  Musculoskeletal:        General: Normal range of motion.  Skin:    General: Skin is warm and dry.  Neurological:     General: No focal deficit present.     Mental Status: She is  alert and oriented to person, place, and time. Mental status is at baseline.  Psychiatric:        Mood and Affect: Mood normal.        Behavior: Behavior normal.        Thought Content: Thought content normal.        Judgment: Judgment normal.      Assessment & Plan:  Binge eating  Class 1 obesity due to excess calories with serious comorbidity and body mass index (BMI) of 33.0 to 33.9 in adult  Essential hypertension -     amLODIPine  Besylate; Take 1 tablet (5 mg total) by mouth daily. TAKE 1 TABLET(5 MG) BY MOUTH DAILY  Dispense: 90 tablet; Refill: 1  Depression, unspecified depression type -     Citalopram  Hydrobromide; Take 1 tablet (20 mg total) by mouth daily. TAKE 1 TABLET(20 MG) BY MOUTH DAILY  Dispense: 90 tablet; Refill: 1  -Continue all medications.  -Refilled Amlodipine  5mg  daily for hypertension and Citalopram  20mg  daily for depression. Patient scored 5 on PHQ-9.  -Follow up in November as scheduled.   Angelgabriel Willmore, NP

## 2024-08-25 NOTE — Patient Instructions (Addendum)
-  It was good to see you today.  -Continue all medications.  -Refilled Amlodipine  5mg  daily for hypertension and Citalopram  20mg  daily for depression. -Follow up in November as scheduled.

## 2024-08-27 LAB — HGB FRACTIONATION CASCADE

## 2024-08-27 LAB — HGB FRACTIONATION BY HPLC
Hgb A2: 3.2 % (ref 1.8–3.2)
Hgb A: 64.6 % — ABNORMAL LOW (ref 96.4–98.8)
Hgb C: 32.2 % — ABNORMAL HIGH
Hgb E: 0 %
Hgb F: 0 % (ref 0.0–2.0)
Hgb S: 0 %
Hgb Variant: 0 %

## 2024-08-30 NOTE — Progress Notes (Incomplete)
 HEMATOLOGY/ONCOLOGY CONSULTATION NOTE  Date of Service: 08/23/2024  Patient Care Team: Billy Philippe SAUNDERS, NP as PCP - General (Family Medicine)  CHIEF COMPLAINTS/PURPOSE OF CONSULTATION:  Iron deficiency anemia   HISTORY OF PRESENTING ILLNESS:   Beth Frost is a wonderful 53 y.o. female who has been referred to us  by Dr .Billy, Philippe SAUNDERS, NP  for evaluation and management of Iron deficiency anemia. She states she is doing well but feels tired and has a significant lack of energy and fatigue. Lacks energy to do daily task. She states Vitamin D  was low and she is on replaceement for this. 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 She lacks energy following work after being on feet all day and needs rest.  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 Abnormal Pap smear    many yrs ago, 2020 [facility]  . Anxiety   . Asthma   . Depression   . Fibroids   . GERD (gastroesophageal reflux disease)   . High blood pressure   . Hypertension   . Palpitations   . STD (sexually transmitted disease)    HSV2  . Vaginal wall cyst   . Vitamin D  deficiency      SURGICAL HISTORY: Past Surgical History:  Procedure Laterality Date  . CHOLECYSTECTOMY N/A 05/11/2014   Procedure: LAPAROSCOPIC CHOLECYSTECTOMY WITH INTRAOPERATIVE CHOLANGIOGRAM;  Surgeon: Dann FORBES Hummer, MD;  Location: [facility] OR;  Service: General;  Laterality: N/A;  . CHOLECYSTECTOMY  04/2014  . COLPOSCOPY    . CRYOTHERAPY     for abnormal pap  . DILATION AND CURETTAGE OF UTERUS    . LAPAROSCOPIC APPENDECTOMY N/A 11/05/2017   Procedure: APPENDECTOMY LAPAROSCOPIC;  Surgeon: Vernetta Berg, MD;  Location: [facility] OR;  Service: General;  Laterality: N/A;    SOCIAL HISTORY: Social History   Socioeconomic History  . Marital status: Married    Spouse name: Not on file  . Number of children: Not on file  . Years of education: Not on file  . Highest education level: 12th grade  Occupational History  . Not on file  Tobacco Use  . Smoking status: Never  . Smokeless tobacco: Never  Vaping Use  . Vaping status: Never Used  Substance and Sexual Activity  . Alcohol use: Yes    Comment: 1 a day  . Drug use: No  . Sexual activity: Yes    Partners: Male    Birth control/protection: Pill  Other Topics Concern  . Not on file  Social History  Narrative   Not on file   Social Drivers of Health   Financial Resource Strain: Low Risk  (08/21/2024)   Overall Financial Resource Strain (CARDIA)    Difficulty of Paying Living Expenses: Not hard at all  Food Insecurity: No Food Insecurity (08/21/2024)   Hunger Vital Sign    Worried About Running Out of Food in the Last Year: Never true    Ran Out of Food in the Last Year: Never true  Transportation Needs: No Transportation Needs (08/21/2024)   PRAPARE - Administrator, Civil Service (Medical): No    Lack of Transportation (Non-Medical): No  Physical Activity: Inactive (08/21/2024)   Exercise Vital Sign    Days of Exercise per Week: 0 days    Minutes of Exercise per Session: Not on file  Stress: Stress Concern Present (08/21/2024)   [employer] of  Occupational Health - Occupational Stress Questionnaire    Feeling of Stress: Very much  Social Connections: Moderately Integrated (08/21/2024)   Social Connection and Isolation Panel    Frequency of Communication with Friends and Family: More than three times a week    Frequency of Social Gatherings with Friends and Family: Once a week    Attends Religious Services: More than 4 times per year    Active Member of [employer] or Organizations: No    Attends Engineer, structural: Not on file    Marital Status: Married  Catering manager Violence: Not on file    FAMILY HISTORY: Family History  Problem Relation Age of Onset   Diabetes Mother    Cancer Mother        leukemia   Lupus Mother    Hypertension Mother    High Cholesterol Mother    Stroke Mother    Depression Mother    Anxiety disorder Mother    Hypertension Father    Diabetes Father    Alcohol abuse Father    Breast cancer Maternal Aunt     ALLERGIES:  is allergic to flagyl  [metronidazole ].  MEDICATIONS:  Current Outpatient Medications  Medication Sig Dispense Refill   albuterol  (VENTOLIN  HFA) 108 (90 Base) MCG/ACT inhaler Inhale 2 puffs into the lungs every 6 (six) hours as needed for wheezing or shortness of breath. 18 g 0   amLODipine  (NORVASC ) 5 MG tablet TAKE 1 TABLET(5 MG) BY MOUTH DAILY 90 tablet 0   azelastine  (ASTELIN ) 0.1 % nasal spray Place 1 spray into both nostrils 2 (two) times daily. Use in each nostril as directed 30 mL 12   cetirizine  (ZYRTEC ) 10 MG tablet Take 1 tablet (10 mg total) by mouth at bedtime for 90 doses. 90 tablet 0   citalopram  (CELEXA ) 20 MG tablet TAKE 1 TABLET(20 MG) BY MOUTH DAILY 30 tablet 2   fluticasone  (FLONASE ) 50 MCG/ACT nasal spray Place 1 spray into both nostrils daily. 48 g 0   lisdexamfetamine (VYVANSE ) 70 MG capsule Take 1 capsule (70 mg total) by mouth daily. 30 capsule 0   norethindrone  (MICRONOR ) 0.35 MG tablet Take 1 tablet (0.35 mg total) by mouth daily. 84 tablet 3    pantoprazole  (PROTONIX ) 40 MG tablet TAKE 1 TABLET(40 MG) BY MOUTH DAILY 90 tablet 0   triamcinolone  cream (KENALOG ) 0.1 % Apply 1 Application topically 2 (two) times daily. 30 g 0   valACYclovir  (VALTREX ) 1000 MG tablet TAKE 1 TABLET BY MOUTH DAILY AT ONSET OF OUTBREAK 90 tablet 0   No current facility-administered medications for this visit.    REVIEW  OF SYSTEMS:    10 Point review of Systems was done is negative except as noted above.  PHYSICAL EXAMINATION: ECOG PERFORMANCE STATUS: 1 - Symptomatic but completely ambulatory  . Vitals:   08/23/24 1055  BP: 136/82  Pulse: 73  Resp: 20  Temp: (!) 97 F (36.1 C)  SpO2: 100%   Filed Weights   08/23/24 1055  Weight: 182 lb 4.8 oz (82.7 kg)   .Body mass index is 33.34 kg/m.  GENERAL:alert, in no acute distress and comfortable SKIN: no acute rashes, no significant lesions EYES: conjunctiva are pink and non-injected, sclera anicteric OROPHARYNX: MMM, no exudates, no oropharyngeal erythema or ulceration NECK: supple, no JVD LYMPH:  no palpable lymphadenopathy in the cervical, axillary or inguinal regions LUNGS: clear to auscultation b/l with normal respiratory effort HEART: regular rate & rhythm ABDOMEN:  normoactive bowel sounds , non tender, not distended. Extremity: no pedal edema PSYCH: alert & oriented x 3 with fluent speech NEURO: no focal motor/sensory deficits  LABORATORY DATA:  I have reviewed the data as listed  .    Latest Ref Rng & Units 08/23/2024   11:49 AM 07/07/2024    3:38 PM 01/27/2024   10:27 AM  CBC  WBC 4.0 - 10.5 K/uL 7.3  7.2  7.2   Hemoglobin 12.0 - 15.0 g/dL 86.5  87.6  86.9   Hematocrit 36.0 - 46.0 % 37.0  38.2  38.7   Platelets 150 - 400 K/uL 329  326  338.0    .CBC    Component Value Date/Time   WBC 7.3 08/23/2024 1149   WBC 7.2 07/07/2024 1538   RBC 4.71 08/23/2024 1149   HGB 13.4 08/23/2024 1149   HGB 13.0 10/26/2014 0000   HCT 37.0 08/23/2024 1149   PLT 329 08/23/2024 1149    MCV 78.6 (L) 08/23/2024 1149   MCV 89.0 02/19/2020 1406   MCH 28.5 08/23/2024 1149   MCHC 36.2 (H) 08/23/2024 1149   RDW 14.5 08/23/2024 1149   LYMPHSABS 2.3 08/23/2024 1149   MONOABS 0.6 08/23/2024 1149   EOSABS 0.1 08/23/2024 1149   BASOSABS 0.1 08/23/2024 1149     .    Latest Ref Rng & Units 08/23/2024   11:49 AM 07/07/2024    3:38 PM 01/27/2024   10:27 AM  CMP  Glucose 70 - 99 mg/dL 81  85  90   BUN 6 - 20 mg/dL 9  8  8    Creatinine 0.44 - 1.00 mg/dL 9.45  9.52  9.50   Sodium 135 - 145 mmol/L 137  136  138   Potassium 3.5 - 5.1 mmol/L 3.8  3.7  3.6   Chloride 98 - 111 mmol/L 104  100  104   CO2 22 - 32 mmol/L 29  27  28    Calcium 8.9 - 10.3 mg/dL 9.0  9.6  8.9   Total Protein 6.5 - 8.1 g/dL 7.3  7.6  7.2   Total Bilirubin 0.0 - 1.2 mg/dL 0.7  1.2  1.0   Alkaline Phos 38 - 126 U/L 68   67   AST 15 - 41 U/L 14  15  17    ALT 0 - 44 U/L 9  9  13     . Lab Results  Component Value Date   IRON 63 08/23/2024   TIBC 486 (H) 08/23/2024   IRONPCTSAT 13 08/23/2024   (Iron and TIBC)  Lab Results  Component Value Date   FERRITIN 25 08/23/2024   Component  Latest Ref Rng 08/23/2024  HGB F     0.0 - 2.0 % Reflexed   HGB F     0.0 - 2.0 % 0.0   Hgb A     96.4 - 98.8 % Reflexed   Hgb A     96.4 - 98.8 % 64.6 (L)   HGB A2     1.8 - 3.2 % 3.2   HGB S     0.0 % Reflexed   HGB S     0.0 % 0.0   HGB C     0.0 % 32.2 (H)   Hgb E     0.0 % 0.0   HGB VARIANT     0.0 % 0.0   Final Interpretation: Comment   Hgb A2     1.8 - 3.2 % Reflexed   Interpretation, Hgb Fract Comment   Iron     28 - 170 ug/dL 63   TIBC     749 - 549 ug/dL 513 (H)   Saturation Ratios     10.4 - 31.8 % 13   UIBC     148 - 442 ug/dL 576   Intrinsic Factor     0.0 - 1.1 AU/mL 1.1   Parietal Cell Antibody-IgG     0.0 - 20.0 Units 34.4 (H)   Ferritin     11 - 307 ng/mL 25     Legend: (L) Low (H) High  RADIOGRAPHIC STUDIES: I have personally reviewed the radiological images as listed  and agreed with the findings in the report. No results found.  ASSESSMENT & PLAN:   53 y.o. 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 Ferritin is 25 with an iron saturation of 13% Patient does not have significant anemia.  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 Can cause some mild low normal hemoglobin levels. All of the patients questions were answered with apparent satisfaction. The patient knows to call the clinic with any problems, questions or concerns.  PLAN - Lab workup was sent out to evaluate the patient's microcytic anemia including iron labs and hemoglobin electrophoresis. Patient was known to have hemoglobin C heterozygous state, iron deficiency and pernicious anemia with antiparietal cell antibodies. - She may not absorb her p.o. iron well. - Will give patient option for IV iron to replace her ferritin 200 with an iron saturation of more than 20% Reasonable to do IV iron since she may not absorb p.o. iron well-related to pernicious anemia. - PCP should consider GI workup for evaluation of iron deficiency anemia . Follow-up Labs today IV Iron at [facility] [address] Return to clinic with Dr. Onesimo with labs in 3 months . Orders Placed This Encounter  Procedures   CBC with Differential ([facility] Center Only)    Standing Status:   Future    Number of Occurrences:   1    Expected Date:   08/23/2024    Expiration Date:   08/23/2025   CMP (Cancer Center only)    Standing Status:   Future    Number of Occurrences:   1    Expected Date:   08/23/2024    Expiration Date:   08/23/2025   Ferritin    Standing Status:   Future    Number of Occurrences:  1    Expected Date:   08/23/2024    Expiration Date:   08/23/2025   Iron and Iron  Binding Capacity ([facility] only)    Standing Status:   Future    Number of Occurrences:   1    Expected Date:   08/23/2024    Expiration Date:   08/23/2025   Hgb Fractionation Cascade    Standing Status:   Future    Number of Occurrences:   1    Expected Date:   08/23/2024    Expiration Date:   08/23/2025   Anti-parietal antibody    Standing Status:   Future    Number of Occurrences:   1    Expected Date:   08/23/2024    Expiration Date:   08/23/2025   Intrinsic factor antibodies    Standing Status:   Future    Number of Occurrences:   1    Expected Date:   08/23/2024    Expiration Date:   08/23/2025  The total time spent in the appointment was 60 minutes*.  All of the patient's questions were answered with apparent satisfaction. The patient knows to call the clinic with any problems, questions or concerns.   Emaline Saran MD MS AAHIVMS [facility] Hematology/Oncology Physician [facility]  .*Total Encounter Time as defined by the Centers for Medicare and Medicaid Services includes, in addition to the face-to-face time of a patient visit (documented in the note above) non-face-to-face time: obtaining and reviewing outside history, ordering and reviewing medications, tests or procedures, care coordination (communications with other health care professionals or caregivers) and documentation in the medical record.   08/23/2024 11:03 AM

## 2024-08-31 ENCOUNTER — Encounter: Payer: Self-pay | Admitting: Hematology

## 2024-08-31 DIAGNOSIS — D500 Iron deficiency anemia secondary to blood loss (chronic): Secondary | ICD-10-CM | POA: Insufficient documentation

## 2024-09-05 ENCOUNTER — Telehealth: Payer: Self-pay

## 2024-09-05 NOTE — Telephone Encounter (Signed)
 Dr. Onesimo, patient will be scheduled as soon as possible.  Auth Submission: APPROVED Site of care: Site of care: [facility] Payer: BCBS commercial Medication & CPT/J Code(s) submitted: Feraheme (ferumoxytol) [id number] Diagnosis Code:  Route of submission (phone, fax, portal): fax Phone # Fax # [phone]  Auth type: Buy/Bill PB Units/visits requested: 510mg  x 2 doses Reference number: [id number] Approval from: 08/31/24 to 08/31/25

## 2024-09-08 ENCOUNTER — Other Ambulatory Visit: Payer: Self-pay | Admitting: Family Medicine

## 2024-09-08 DIAGNOSIS — R632 Polyphagia: Secondary | ICD-10-CM

## 2024-09-08 DIAGNOSIS — E661 Drug-induced obesity: Secondary | ICD-10-CM

## 2024-09-08 MED ORDER — LISDEXAMFETAMINE DIMESYLATE 70 MG PO CAPS: 70.0000 mg | ORAL_CAPSULE | Freq: Every day | ORAL | 0 refills | Status: DC

## 2024-09-08 NOTE — Progress Notes (Signed)
 Requesting refill on Vyvanse . PDMP reviewed. Last refill was on 08/28. UDS and controlled substance contract UTD.

## 2024-09-11 ENCOUNTER — Other Ambulatory Visit: Payer: Self-pay | Admitting: Family Medicine

## 2024-09-11 ENCOUNTER — Other Ambulatory Visit: Payer: Self-pay

## 2024-09-11 DIAGNOSIS — E66811 Obesity, class 1: Secondary | ICD-10-CM

## 2024-09-11 DIAGNOSIS — R632 Polyphagia: Secondary | ICD-10-CM

## 2024-09-11 MED ORDER — LISDEXAMFETAMINE DIMESYLATE 70 MG PO CAPS
70.0000 mg | ORAL_CAPSULE | Freq: Every day | ORAL | 0 refills | Status: DC
  Filled 2024-09-11: qty 30, 30d supply, fill #0

## 2024-09-11 NOTE — Progress Notes (Signed)
 Requesting refill on Vyvanse . PDMP reviewed. Last refill was on 08/28. UDS and controlled substance contract UTD.

## 2024-09-12 LAB — COLOGUARD

## 2024-09-14 ENCOUNTER — Other Ambulatory Visit: Payer: Self-pay

## 2024-09-14 ENCOUNTER — Encounter: Payer: Self-pay | Admitting: Pharmacist

## 2024-09-21 ENCOUNTER — Ambulatory Visit (INDEPENDENT_AMBULATORY_CARE_PROVIDER_SITE_OTHER)

## 2024-09-21 VITALS — BP 124/79 | HR 75 | Temp 97.7°F | Resp 16 | Ht 62.0 in | Wt 177.8 lb

## 2024-09-21 DIAGNOSIS — D500 Iron deficiency anemia secondary to blood loss (chronic): Secondary | ICD-10-CM

## 2024-09-21 MED ORDER — SODIUM CHLORIDE 0.9 % IV SOLN
510.0000 mg | Freq: Once | INTRAVENOUS | Status: AC
  Administered 2024-09-21: 510 mg via INTRAVENOUS
  Filled 2024-09-21: qty 17

## 2024-09-21 MED ORDER — LORATADINE 10 MG PO TABS
10.0000 mg | ORAL_TABLET | Freq: Every day | ORAL | Status: DC
  Administered 2024-09-21: 10 mg via ORAL
  Filled 2024-09-21: qty 1

## 2024-09-21 MED ORDER — ACETAMINOPHEN 325 MG PO TABS
650.0000 mg | ORAL_TABLET | Freq: Once | ORAL | Status: AC
  Administered 2024-09-21: 650 mg via ORAL
  Filled 2024-09-21: qty 2

## 2024-09-21 NOTE — Progress Notes (Signed)
 Diagnosis: Iron Deficiency Anemia  Provider:  Mannam, Praveen MD  Procedure: IV Infusion  IV Type: Peripheral, IV Location: L Antecubital  Feraheme (Ferumoxytol), Dose: 510 mg  Infusion Start Time: 1358  Infusion Stop Time: 1415  Post Infusion IV Care: Observation period completed and Peripheral IV Discontinued  Discharge: Condition: Stable, Destination: Home . AVS Declined  Performed by:  Rocky FORBES Sar, RN

## 2024-09-22 ENCOUNTER — Other Ambulatory Visit: Payer: Self-pay | Admitting: Family Medicine

## 2024-09-22 DIAGNOSIS — R1013 Epigastric pain: Secondary | ICD-10-CM

## 2024-10-05 ENCOUNTER — Ambulatory Visit (INDEPENDENT_AMBULATORY_CARE_PROVIDER_SITE_OTHER)

## 2024-10-05 VITALS — BP 110/65 | HR 80 | Temp 97.4°F | Resp 16 | Ht 62.0 in | Wt 181.2 lb

## 2024-10-05 DIAGNOSIS — D500 Iron deficiency anemia secondary to blood loss (chronic): Secondary | ICD-10-CM

## 2024-10-05 MED ORDER — SODIUM CHLORIDE 0.9 % IV SOLN
510.0000 mg | Freq: Once | INTRAVENOUS | Status: AC
  Administered 2024-10-05: 510 mg via INTRAVENOUS
  Filled 2024-10-05: qty 17

## 2024-10-05 MED ORDER — ACETAMINOPHEN 325 MG PO TABS
650.0000 mg | ORAL_TABLET | Freq: Once | ORAL | Status: AC
  Administered 2024-10-05: 650 mg via ORAL
  Filled 2024-10-05: qty 2

## 2024-10-05 MED ORDER — LORATADINE 10 MG PO TABS
10.0000 mg | ORAL_TABLET | Freq: Every day | ORAL | Status: DC
  Administered 2024-10-05: 10 mg via ORAL
  Filled 2024-10-05: qty 1

## 2024-10-05 NOTE — Progress Notes (Signed)
 Diagnosis: Iron Deficiency Anemia  Provider:  Praveen Mannam MD  Procedure: IV Infusion  IV Type: Peripheral, IV Location: R Antecubital  Feraheme (Ferumoxytol), Dose: 510 mg  Infusion Start Time: 1309  Infusion Stop Time: 1326 pm  Post Infusion IV Care: Observation period completed and Peripheral IV Discontinued  Discharge: Condition: Good, Destination: Home . AVS Declined  Performed by:  Leita FORBES Miles, LPN

## 2024-10-09 ENCOUNTER — Ambulatory Visit: Admitting: Family Medicine

## 2024-10-09 LAB — COLOGUARD: COLOGUARD: NEGATIVE

## 2024-10-24 ENCOUNTER — Ambulatory Visit: Admitting: Family Medicine

## 2024-12-12 ENCOUNTER — Ambulatory Visit: Admitting: Family Medicine

## 2024-12-13 ENCOUNTER — Other Ambulatory Visit: Payer: Self-pay

## 2024-12-13 ENCOUNTER — Ambulatory Visit: Admitting: Family Medicine

## 2024-12-13 ENCOUNTER — Encounter: Payer: Self-pay | Admitting: Family Medicine

## 2024-12-13 VITALS — BP 124/74 | HR 70 | Temp 98.2°F | Ht 62.0 in | Wt 182.0 lb

## 2024-12-13 DIAGNOSIS — I10 Essential (primary) hypertension: Secondary | ICD-10-CM

## 2024-12-13 DIAGNOSIS — Z6834 Body mass index (BMI) 34.0-34.9, adult: Secondary | ICD-10-CM | POA: Diagnosis not present

## 2024-12-13 DIAGNOSIS — E66811 Obesity, class 1: Secondary | ICD-10-CM

## 2024-12-13 DIAGNOSIS — E559 Vitamin D deficiency, unspecified: Secondary | ICD-10-CM | POA: Diagnosis not present

## 2024-12-13 DIAGNOSIS — F32A Depression, unspecified: Secondary | ICD-10-CM | POA: Diagnosis not present

## 2024-12-13 DIAGNOSIS — R632 Polyphagia: Secondary | ICD-10-CM | POA: Diagnosis not present

## 2024-12-13 DIAGNOSIS — E7800 Pure hypercholesterolemia, unspecified: Secondary | ICD-10-CM | POA: Diagnosis not present

## 2024-12-13 LAB — LIPID PANEL
Cholesterol: 200 mg/dL (ref 28–200)
HDL: 74.1 mg/dL
LDL Cholesterol: 115 mg/dL — ABNORMAL HIGH (ref 10–99)
NonHDL: 125.53
Total CHOL/HDL Ratio: 3
Triglycerides: 51 mg/dL (ref 10.0–149.0)
VLDL: 10.2 mg/dL (ref 0.0–40.0)

## 2024-12-13 LAB — COMPREHENSIVE METABOLIC PANEL WITH GFR
ALT: 10 U/L (ref 3–35)
AST: 17 U/L (ref 5–37)
Albumin: 4.4 g/dL (ref 3.5–5.2)
Alkaline Phosphatase: 52 U/L (ref 39–117)
BUN: 7 mg/dL (ref 6–23)
CO2: 30 meq/L (ref 19–32)
Calcium: 9.1 mg/dL (ref 8.4–10.5)
Chloride: 103 meq/L (ref 96–112)
Creatinine, Ser: 0.49 mg/dL (ref 0.40–1.20)
GFR: 109.05 mL/min
Glucose, Bld: 87 mg/dL (ref 70–99)
Potassium: 3.5 meq/L (ref 3.5–5.1)
Sodium: 138 meq/L (ref 135–145)
Total Bilirubin: 1.5 mg/dL — ABNORMAL HIGH (ref 0.2–1.2)
Total Protein: 7.7 g/dL (ref 6.0–8.3)

## 2024-12-13 LAB — VITAMIN D 25 HYDROXY (VIT D DEFICIENCY, FRACTURES): VITD: 20.44 ng/mL — ABNORMAL LOW (ref 30.00–100.00)

## 2024-12-13 MED ORDER — LISDEXAMFETAMINE DIMESYLATE 70 MG PO CAPS
70.0000 mg | ORAL_CAPSULE | Freq: Every day | ORAL | 0 refills | Status: AC
  Filled 2024-12-13: qty 30, 30d supply, fill #0

## 2024-12-13 NOTE — Assessment & Plan Note (Addendum)
 BP Stable. Continue with Amlodipine  5mg  daily. Ordered CMP to assess kidney function.

## 2024-12-13 NOTE — Patient Instructions (Addendum)
 It was nice to see you today Today we: -Discussed management of chronic conditions. -Continue all medications. -Sent in Vyvanse  prescription to pharmacy. -Ordered labs: CMP, lipid profile, Vit D. -In office urine drug screen for Vyvanse  compliance. Follow up in 3 months for physical.

## 2024-12-13 NOTE — Assessment & Plan Note (Addendum)
 Stable. Patient is taking Citalopram  20 mg daily. Reports effective. Pt scored 5 on PHQ-9 and 4 on GAD-7.

## 2024-12-13 NOTE — Assessment & Plan Note (Addendum)
 Was prior taking Vyvanse , however stopped due to unknown reason. Reports she stopped taking 2 months ago. Is requesting to start again. Start Vyvanse  70 mg daily for binge eating. UDS ordered. Controlled substance contract is still UTD (02/04/2024). PDMP reviewed. Last refill was on 09/12/2024.

## 2024-12-13 NOTE — Assessment & Plan Note (Signed)
 Ordered lipid panel

## 2024-12-13 NOTE — Progress Notes (Signed)
 "  Established Patient Office Visit   Subjective:  Patient ID: Beth Frost, female    DOB: 1971/08/05  Age: 54 y.o. MRN: [id number]  Chief Complaint  Patient presents with   Medical Management of Chronic Issues    3 month follow up    HTN: Chronic. Patient is taking Amlodipine  5mg  tablet. BP readings are WNL.   Depression: Chronic. Patient is taking Citalopram  20mg  daily. Reports effective.   Binge eating: Patient was taking Vyvanse  70mg  daily for binge eating and weight loss. Reports that she stopped taking Vyvanse  about 2 months ago. Reports she does not know why she stopped taking it. Says she feels like it helps curve her appetite. Says she feels less focused at work since stopping. She is requesting to start medication again.   Iron deficiency anemia: Seeing Dr. Emaline Saran at [facility] for iron deficiency anemia. As of 09/21/24 was receiving IV iron infusions. She reports having 2 rounds of IV iron and has a follow up scheduled in February.  HPI Review of Systems  Constitutional: Negative.   HENT: Negative.    Eyes: Negative.   Respiratory: Negative.    Cardiovascular: Negative.   Gastrointestinal: Negative.   Genitourinary: Negative.   Musculoskeletal: Negative.   Skin: Negative.   Neurological: Negative.   Psychiatric/Behavioral: Negative.      Objective:    BP 124/74   Pulse 70   Temp 98.2 F (36.8 C) (Oral)   Ht 5' 2 (1.575 m)   Wt 182 lb (82.6 kg)   SpO2 97%   BMI 33.29 kg/m  BP Readings from Last 3 Encounters:  12/13/24 124/74  10/05/24 110/65  09/21/24 124/79   Wt Readings from Last 3 Encounters:  12/13/24 182 lb (82.6 kg)  10/05/24 181 lb 3.2 oz (82.2 kg)  09/21/24 177 lb 12.8 oz (80.6 kg)    Physical Exam Constitutional:      Appearance: Normal appearance. She is obese.  Cardiovascular:     Rate and Rhythm: Normal rate and regular rhythm.     Heart sounds: Normal heart sounds.  Pulmonary:     Effort: Pulmonary effort is  normal.     Breath sounds: Normal breath sounds.  Skin:    General: Skin is warm and dry.  Neurological:     General: No focal deficit present.     Mental Status: She is alert and oriented to person, place, and time. Mental status is at baseline.  Psychiatric:        Mood and Affect: Mood normal.        Behavior: Behavior normal.        Thought Content: Thought content normal.        Judgment: Judgment normal.   No results found for any visits on 12/13/24.  The 10-year ASCVD risk score (Arnett DK, et al., 2019) is: 2.4%   Assessment & Plan:  Essential hypertension Assessment & Plan: BP Stable. Continue with Amlodipine  5mg  daily. Ordered CMP to assess kidney function.   Orders: -     Comprehensive metabolic panel with GFR  Elevated LDL cholesterol level Assessment & Plan: Ordered lipid panel  Orders: -     Lipid panel  Vitamin D  deficiency Assessment & Plan: Ordered vitamin D  level.   Orders: -     VITAMIN D  25 Hydroxy (Vit-D Deficiency, Fractures)  Binge eating Assessment & Plan: Was prior taking Vyvanse , however stopped due to unknown reason. Reports she stopped taking 2 months ago. Is  requesting to start again. Start Vyvanse  70 mg daily for binge eating. UDS ordered. Controlled substance contract is still UTD (02/04/2024). PDMP reviewed. Last refill was on 09/12/2024.   Orders: -     DRUG MONITOR, PANEL 1, SCREEN, URINE -     Lisdexamfetamine  Dimesylate; Take 1 capsule (70 mg total) by mouth daily.  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  Dispense: 30 capsule; Refill: 0  Depression, unspecified depression type Assessment & Plan: Stable. Patient is taking Citalopram  20 mg daily. Reports effective. Pt scored 5 on PHQ-9 and 4 on GAD-7.     Health  Maintenance: -Tdap: Denies. -Influenza: Denies. -COVID: Denies. -Pneumococcal: Denies. -Hep B: Most likely has had. -Zoster: Denies. -Continue all medications. -Is following up with Oncology on 12/26/24. (CBC, Iron level, Vit B12).  Follow up in 3 months for physical.  I personally was present during the history, physical exam, and medical decision-making activities of this service and have verified that the service and findings are accurately documented in the nurse practitioner student's note.  JoAnna Williamson, NP  JoAnna Williamson, NP "

## 2024-12-13 NOTE — Assessment & Plan Note (Signed)
 "  Ordered vitamin-D level  "

## 2024-12-14 ENCOUNTER — Ambulatory Visit: Payer: Self-pay | Admitting: Family Medicine

## 2024-12-14 DIAGNOSIS — E559 Vitamin D deficiency, unspecified: Secondary | ICD-10-CM

## 2024-12-14 LAB — DRUG MONITOR, PANEL 1, SCREEN, URINE
Amphetamines: NEGATIVE ng/mL
Barbiturates: NEGATIVE ng/mL
Benzodiazepines: NEGATIVE ng/mL
Cocaine Metabolite: NEGATIVE ng/mL
Creatinine: 32.2 mg/dL
Marijuana Metabolite: NEGATIVE ng/mL
Methadone Metabolite: NEGATIVE ng/mL
Opiates: NEGATIVE ng/mL
Oxidant: NEGATIVE ug/mL
Oxycodone: NEGATIVE ng/mL
Phencyclidine: NEGATIVE ng/mL
pH: 8 (ref 4.5–9.0)

## 2024-12-14 LAB — DM TEMPLATE

## 2024-12-14 MED ORDER — VITAMIN D3 1.25 MG (50000 UT) PO CAPS: 1.2500 mg | ORAL_CAPSULE | ORAL | 0 refills | Status: AC

## 2024-12-22 ENCOUNTER — Other Ambulatory Visit: Payer: Self-pay

## 2024-12-22 DIAGNOSIS — D500 Iron deficiency anemia secondary to blood loss (chronic): Secondary | ICD-10-CM

## 2024-12-23 ENCOUNTER — Other Ambulatory Visit: Payer: Self-pay | Admitting: Family Medicine

## 2024-12-23 DIAGNOSIS — R1013 Epigastric pain: Secondary | ICD-10-CM

## 2024-12-26 ENCOUNTER — Telehealth: Payer: Self-pay | Admitting: Hematology

## 2024-12-26 ENCOUNTER — Inpatient Hospital Stay: Admitting: Hematology

## 2024-12-26 ENCOUNTER — Inpatient Hospital Stay

## 2025-01-22 ENCOUNTER — Inpatient Hospital Stay: Admitting: Hematology

## 2025-01-22 ENCOUNTER — Inpatient Hospital Stay

## 2025-03-13 ENCOUNTER — Encounter: Admitting: Family Medicine
